# Patient Record
Sex: Male | Born: 1940 | Race: White | Hispanic: No | Marital: Married | Smoking: Current every day smoker
Health system: Southern US, Community
[De-identification: ages and names within clinical notes are randomized; demographics above are authoritative.]

## PROBLEM LIST (undated history)

## (undated) DIAGNOSIS — E119 Type 2 diabetes mellitus without complications: Secondary | ICD-10-CM

## (undated) DIAGNOSIS — M542 Cervicalgia: Secondary | ICD-10-CM

## (undated) DIAGNOSIS — I10 Essential (primary) hypertension: Secondary | ICD-10-CM

## (undated) DIAGNOSIS — E7800 Pure hypercholesterolemia, unspecified: Secondary | ICD-10-CM

## (undated) DIAGNOSIS — I4891 Unspecified atrial fibrillation: Secondary | ICD-10-CM

## (undated) DIAGNOSIS — I499 Cardiac arrhythmia, unspecified: Secondary | ICD-10-CM

## (undated) DIAGNOSIS — I639 Cerebral infarction, unspecified: Secondary | ICD-10-CM

## (undated) HISTORY — PX: ANTERIOR / POSTERIOR COMBINED FUSION CERVICAL SPINE: SUR627

## (undated) HISTORY — PX: NO PAST SURGERIES: SHX2092

## (undated) HISTORY — PX: VASECTOMY: SHX75

---

## 2008-01-05 HISTORY — PX: LAMINOTOMY / EXCISION DISK POSTERIOR CERVICAL SPINE: SUR749

## 2011-08-05 DIAGNOSIS — G4733 Obstructive sleep apnea (adult) (pediatric): Secondary | ICD-10-CM | POA: Insufficient documentation

## 2011-08-05 DIAGNOSIS — I10 Essential (primary) hypertension: Secondary | ICD-10-CM | POA: Insufficient documentation

## 2011-08-05 DIAGNOSIS — E669 Obesity, unspecified: Secondary | ICD-10-CM | POA: Insufficient documentation

## 2011-08-06 DIAGNOSIS — I452 Bifascicular block: Secondary | ICD-10-CM | POA: Insufficient documentation

## 2011-08-14 DIAGNOSIS — I4891 Unspecified atrial fibrillation: Secondary | ICD-10-CM | POA: Insufficient documentation

## 2011-12-23 DIAGNOSIS — K219 Gastro-esophageal reflux disease without esophagitis: Secondary | ICD-10-CM | POA: Insufficient documentation

## 2012-04-20 DIAGNOSIS — R109 Unspecified abdominal pain: Secondary | ICD-10-CM | POA: Insufficient documentation

## 2012-04-28 DIAGNOSIS — D3000 Benign neoplasm of unspecified kidney: Secondary | ICD-10-CM | POA: Insufficient documentation

## 2012-04-28 HISTORY — DX: Benign neoplasm of unspecified kidney: D30.00

## 2012-05-03 DIAGNOSIS — G894 Chronic pain syndrome: Secondary | ICD-10-CM | POA: Insufficient documentation

## 2012-07-27 DIAGNOSIS — E041 Nontoxic single thyroid nodule: Secondary | ICD-10-CM | POA: Insufficient documentation

## 2012-10-26 DIAGNOSIS — H269 Unspecified cataract: Secondary | ICD-10-CM | POA: Insufficient documentation

## 2013-05-08 DIAGNOSIS — G8220 Paraplegia, unspecified: Secondary | ICD-10-CM | POA: Insufficient documentation

## 2013-05-17 DIAGNOSIS — I679 Cerebrovascular disease, unspecified: Secondary | ICD-10-CM | POA: Insufficient documentation

## 2013-08-16 DIAGNOSIS — G629 Polyneuropathy, unspecified: Secondary | ICD-10-CM | POA: Insufficient documentation

## 2013-08-16 DIAGNOSIS — G8929 Other chronic pain: Secondary | ICD-10-CM | POA: Insufficient documentation

## 2013-08-16 DIAGNOSIS — M25559 Pain in unspecified hip: Secondary | ICD-10-CM | POA: Insufficient documentation

## 2014-03-06 DIAGNOSIS — D472 Monoclonal gammopathy: Secondary | ICD-10-CM | POA: Insufficient documentation

## 2014-06-06 DIAGNOSIS — R296 Repeated falls: Secondary | ICD-10-CM | POA: Insufficient documentation

## 2014-06-10 DIAGNOSIS — I480 Paroxysmal atrial fibrillation: Secondary | ICD-10-CM | POA: Insufficient documentation

## 2014-06-10 DIAGNOSIS — H3530 Unspecified macular degeneration: Secondary | ICD-10-CM | POA: Insufficient documentation

## 2014-11-15 DIAGNOSIS — R0600 Dyspnea, unspecified: Secondary | ICD-10-CM | POA: Insufficient documentation

## 2014-11-15 DIAGNOSIS — R0609 Other forms of dyspnea: Secondary | ICD-10-CM | POA: Insufficient documentation

## 2015-04-23 DIAGNOSIS — R079 Chest pain, unspecified: Secondary | ICD-10-CM | POA: Insufficient documentation

## 2015-04-23 DIAGNOSIS — Z9189 Other specified personal risk factors, not elsewhere classified: Secondary | ICD-10-CM | POA: Insufficient documentation

## 2015-12-18 DIAGNOSIS — Z9181 History of falling: Secondary | ICD-10-CM | POA: Insufficient documentation

## 2015-12-18 DIAGNOSIS — R270 Ataxia, unspecified: Secondary | ICD-10-CM | POA: Insufficient documentation

## 2015-12-18 DIAGNOSIS — I639 Cerebral infarction, unspecified: Secondary | ICD-10-CM | POA: Insufficient documentation

## 2015-12-19 DIAGNOSIS — R251 Tremor, unspecified: Secondary | ICD-10-CM | POA: Insufficient documentation

## 2016-02-06 DIAGNOSIS — M5136 Other intervertebral disc degeneration, lumbar region: Secondary | ICD-10-CM | POA: Insufficient documentation

## 2016-02-06 DIAGNOSIS — M5417 Radiculopathy, lumbosacral region: Secondary | ICD-10-CM | POA: Insufficient documentation

## 2016-02-06 DIAGNOSIS — M51369 Other intervertebral disc degeneration, lumbar region without mention of lumbar back pain or lower extremity pain: Secondary | ICD-10-CM | POA: Insufficient documentation

## 2016-07-23 DIAGNOSIS — M1990 Unspecified osteoarthritis, unspecified site: Secondary | ICD-10-CM | POA: Insufficient documentation

## 2016-07-26 DIAGNOSIS — Z7901 Long term (current) use of anticoagulants: Secondary | ICD-10-CM | POA: Insufficient documentation

## 2016-10-23 DIAGNOSIS — Z8679 Personal history of other diseases of the circulatory system: Secondary | ICD-10-CM | POA: Insufficient documentation

## 2016-10-23 DIAGNOSIS — Z9889 Other specified postprocedural states: Secondary | ICD-10-CM | POA: Insufficient documentation

## 2017-07-22 ENCOUNTER — Emergency Department: Payer: Medicare Other

## 2017-07-22 ENCOUNTER — Other Ambulatory Visit: Payer: Self-pay

## 2017-07-22 ENCOUNTER — Emergency Department
Admission: EM | Admit: 2017-07-22 | Discharge: 2017-07-22 | Disposition: A | Discharge status: Home / Self Care | Payer: Medicare Other | Attending: Emergency Medicine | Admitting: Emergency Medicine

## 2017-07-22 DIAGNOSIS — F17290 Nicotine dependence, other tobacco product, uncomplicated: Secondary | ICD-10-CM | POA: Diagnosis not present

## 2017-07-22 DIAGNOSIS — Y999 Unspecified external cause status: Secondary | ICD-10-CM | POA: Diagnosis not present

## 2017-07-22 DIAGNOSIS — S0101XA Laceration without foreign body of scalp, initial encounter: Secondary | ICD-10-CM | POA: Diagnosis not present

## 2017-07-22 DIAGNOSIS — Y9289 Other specified places as the place of occurrence of the external cause: Secondary | ICD-10-CM | POA: Diagnosis not present

## 2017-07-22 DIAGNOSIS — S46911A Strain of unspecified muscle, fascia and tendon at shoulder and upper arm level, right arm, initial encounter: Secondary | ICD-10-CM

## 2017-07-22 DIAGNOSIS — Y9389 Activity, other specified: Secondary | ICD-10-CM | POA: Diagnosis not present

## 2017-07-22 DIAGNOSIS — W19XXXA Unspecified fall, initial encounter: Secondary | ICD-10-CM

## 2017-07-22 DIAGNOSIS — S0990XA Unspecified injury of head, initial encounter: Secondary | ICD-10-CM | POA: Diagnosis present

## 2017-07-22 HISTORY — DX: Pure hypercholesterolemia, unspecified: E78.00

## 2017-07-22 HISTORY — DX: Cervicalgia: M54.2

## 2017-07-22 HISTORY — DX: Cerebral infarction, unspecified: I63.9

## 2017-07-22 HISTORY — DX: Unspecified atrial fibrillation: I48.91

## 2017-07-22 MED ORDER — LIDOCAINE-EPINEPHRINE (PF) 1 %-1:200000 IJ SOLN: 30.0000 mL | Freq: Once | INTRAMUSCULAR | Status: DC

## 2017-07-22 MED ORDER — OXYCODONE-ACETAMINOPHEN 5-325 MG PO TABS
2.0000 | ORAL_TABLET | Freq: Once | ORAL | Status: AC
  Administered 2017-07-22: 2 via ORAL
  Filled 2017-07-22: qty 2

## 2017-07-22 MED ORDER — OXYCODONE-ACETAMINOPHEN 7.5-325 MG PO TABS: 1.0000 | ORAL_TABLET | ORAL | 0 refills | Status: AC | PRN

## 2017-07-22 MED ORDER — MORPHINE SULFATE (PF) 4 MG/ML IV SOLN
4.0000 mg | Freq: Once | INTRAVENOUS | Status: AC
  Administered 2017-07-22: 4 mg via INTRAMUSCULAR
  Filled 2017-07-22: qty 1

## 2017-07-22 MED ORDER — LIDOCAINE-EPINEPHRINE 2 %-1:100000 IJ SOLN
INTRAMUSCULAR | Status: AC
  Administered 2017-07-22: 18:00:00
  Filled 2017-07-22: qty 1
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
## 2017-07-22 NOTE — ED Notes (Signed)
Laceration to back of head cleansed and dressed upon arrival.
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
## 2017-08-26 DIAGNOSIS — I729 Aneurysm of unspecified site: Secondary | ICD-10-CM | POA: Insufficient documentation

## 2018-08-02 DIAGNOSIS — I951 Orthostatic hypotension: Secondary | ICD-10-CM | POA: Insufficient documentation

## 2018-08-02 DIAGNOSIS — G909 Disorder of the autonomic nervous system, unspecified: Secondary | ICD-10-CM | POA: Insufficient documentation

## 2018-09-01 DIAGNOSIS — H5581 Saccadic eye movements: Secondary | ICD-10-CM | POA: Insufficient documentation

## 2018-09-01 DIAGNOSIS — F0781 Postconcussional syndrome: Secondary | ICD-10-CM | POA: Insufficient documentation

## 2018-09-01 DIAGNOSIS — H5111 Convergence insufficiency: Secondary | ICD-10-CM | POA: Insufficient documentation

## 2018-09-01 HISTORY — DX: Postconcussional syndrome: F07.81

## 2018-09-15 DIAGNOSIS — F331 Major depressive disorder, recurrent, moderate: Secondary | ICD-10-CM | POA: Insufficient documentation

## 2018-09-17 ENCOUNTER — Emergency Department: Payer: Medicare Other

## 2018-09-17 ENCOUNTER — Other Ambulatory Visit: Payer: Self-pay

## 2018-09-17 ENCOUNTER — Emergency Department
Admission: EM | Admit: 2018-09-17 | Discharge: 2018-09-17 | Disposition: A | Discharge status: Home / Self Care | Payer: Medicare Other | Attending: Emergency Medicine | Admitting: Emergency Medicine

## 2018-09-17 DIAGNOSIS — S80811A Abrasion, right lower leg, initial encounter: Secondary | ICD-10-CM | POA: Diagnosis not present

## 2018-09-17 DIAGNOSIS — W19XXXA Unspecified fall, initial encounter: Secondary | ICD-10-CM

## 2018-09-17 DIAGNOSIS — Y9301 Activity, walking, marching and hiking: Secondary | ICD-10-CM | POA: Insufficient documentation

## 2018-09-17 DIAGNOSIS — S80812A Abrasion, left lower leg, initial encounter: Secondary | ICD-10-CM | POA: Diagnosis not present

## 2018-09-17 DIAGNOSIS — S0993XA Unspecified injury of face, initial encounter: Secondary | ICD-10-CM | POA: Diagnosis present

## 2018-09-17 DIAGNOSIS — W1781XA Fall down embankment (hill), initial encounter: Secondary | ICD-10-CM | POA: Insufficient documentation

## 2018-09-17 DIAGNOSIS — S0081XA Abrasion of other part of head, initial encounter: Secondary | ICD-10-CM | POA: Diagnosis not present

## 2018-09-17 DIAGNOSIS — Y9289 Other specified places as the place of occurrence of the external cause: Secondary | ICD-10-CM | POA: Diagnosis not present

## 2018-09-17 DIAGNOSIS — M542 Cervicalgia: Secondary | ICD-10-CM | POA: Diagnosis not present

## 2018-09-17 DIAGNOSIS — F17290 Nicotine dependence, other tobacco product, uncomplicated: Secondary | ICD-10-CM | POA: Diagnosis not present

## 2018-09-17 DIAGNOSIS — Y999 Unspecified external cause status: Secondary | ICD-10-CM | POA: Diagnosis not present

## 2018-09-17 DIAGNOSIS — T07XXXA Unspecified multiple injuries, initial encounter: Secondary | ICD-10-CM

## 2018-09-17 DIAGNOSIS — S40811A Abrasion of right upper arm, initial encounter: Secondary | ICD-10-CM | POA: Diagnosis not present

## 2018-09-17 DIAGNOSIS — S40812A Abrasion of left upper arm, initial encounter: Secondary | ICD-10-CM | POA: Diagnosis not present

## 2018-09-17 MED ORDER — CYCLOBENZAPRINE HCL 10 MG PO TABS: 10.0000 mg | ORAL_TABLET | Freq: Three times a day (TID) | ORAL | 0 refills | Status: DC | PRN

## 2018-09-17 MED ORDER — HYDROCODONE-ACETAMINOPHEN 5-325 MG PO TABS
1.0000 | ORAL_TABLET | Freq: Once | ORAL | Status: AC
  Administered 2018-09-17: 1 via ORAL
  Filled 2018-09-17: qty 1

## 2018-09-17 MED ORDER — BACITRACIN ZINC 500 UNIT/GM EX OINT
TOPICAL_OINTMENT | Freq: Once | CUTANEOUS | Status: AC
  Administered 2018-09-17: 1 via TOPICAL
  Filled 2018-09-17: qty 4.5
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
## 2018-09-17 NOTE — Discharge Instructions (Addendum)
Your exam and CT scans were negative for any fracture or disruption to your hardware. Take your home meds and the muscle relaxant as needed. Follow-up with your provider as needed. You can expect to be sore for a few days. Keep the abrasions clean and apply petroleum jelly to promote healing.
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
## 2018-09-27 ENCOUNTER — Inpatient Hospital Stay
Admission: EM | Admit: 2018-09-27 | Discharge: 2018-09-29 | DRG: 872 | Disposition: A | Discharge status: Home / Self Care | Payer: Medicare Other | Attending: Internal Medicine | Admitting: Internal Medicine

## 2018-09-27 ENCOUNTER — Other Ambulatory Visit: Payer: Self-pay

## 2018-09-27 ENCOUNTER — Emergency Department: Payer: Medicare Other

## 2018-09-27 ENCOUNTER — Inpatient Hospital Stay: Payer: Medicare Other

## 2018-09-27 DIAGNOSIS — Z79899 Other long term (current) drug therapy: Secondary | ICD-10-CM

## 2018-09-27 DIAGNOSIS — G4733 Obstructive sleep apnea (adult) (pediatric): Secondary | ICD-10-CM | POA: Diagnosis present

## 2018-09-27 DIAGNOSIS — I480 Paroxysmal atrial fibrillation: Secondary | ICD-10-CM | POA: Diagnosis present

## 2018-09-27 DIAGNOSIS — E119 Type 2 diabetes mellitus without complications: Secondary | ICD-10-CM | POA: Diagnosis present

## 2018-09-27 DIAGNOSIS — A419 Sepsis, unspecified organism: Principal | ICD-10-CM | POA: Diagnosis present

## 2018-09-27 DIAGNOSIS — Z20828 Contact with and (suspected) exposure to other viral communicable diseases: Secondary | ICD-10-CM | POA: Diagnosis present

## 2018-09-27 DIAGNOSIS — Z8673 Personal history of transient ischemic attack (TIA), and cerebral infarction without residual deficits: Secondary | ICD-10-CM | POA: Diagnosis not present

## 2018-09-27 DIAGNOSIS — I10 Essential (primary) hypertension: Secondary | ICD-10-CM | POA: Diagnosis present

## 2018-09-27 DIAGNOSIS — Z7901 Long term (current) use of anticoagulants: Secondary | ICD-10-CM

## 2018-09-27 DIAGNOSIS — E785 Hyperlipidemia, unspecified: Secondary | ICD-10-CM | POA: Diagnosis present

## 2018-09-27 DIAGNOSIS — F17200 Nicotine dependence, unspecified, uncomplicated: Secondary | ICD-10-CM | POA: Diagnosis present

## 2018-09-27 DIAGNOSIS — K219 Gastro-esophageal reflux disease without esophagitis: Secondary | ICD-10-CM | POA: Diagnosis present

## 2018-09-27 DIAGNOSIS — L03114 Cellulitis of left upper limb: Secondary | ICD-10-CM | POA: Diagnosis present

## 2018-09-27 DIAGNOSIS — R140 Abdominal distension (gaseous): Secondary | ICD-10-CM | POA: Diagnosis present

## 2018-09-27 DIAGNOSIS — Z7982 Long term (current) use of aspirin: Secondary | ICD-10-CM

## 2018-09-27 LAB — LACTIC ACID, PLASMA
Lactic Acid, Venous: 1.6 mmol/L (ref 0.5–1.9)
Lactic Acid, Venous: 1 mmol/L (ref 0.5–1.9)

## 2018-09-27 LAB — CBC WITH DIFFERENTIAL/PLATELET
Abs Immature Granulocytes: 0.05 10*3/uL (ref 0.00–0.07)
Basophils Absolute: 0.1 10*3/uL (ref 0.0–0.1)
Basophils Relative: 0 %
Eosinophils Absolute: 0 10*3/uL (ref 0.0–0.5)
Eosinophils Relative: 0 %
HCT: 47.1 % (ref 39.0–52.0)
Hemoglobin: 15.3 g/dL (ref 13.0–17.0)
Immature Granulocytes: 0 %
Lymphocytes Relative: 6 %
Lymphs Abs: 0.7 10*3/uL (ref 0.7–4.0)
MCH: 29.2 pg (ref 26.0–34.0)
MCHC: 32.5 g/dL (ref 30.0–36.0)
MCV: 89.9 fL (ref 80.0–100.0)
Monocytes Absolute: 1.1 10*3/uL — ABNORMAL HIGH (ref 0.1–1.0)
Monocytes Relative: 10 %
Neutro Abs: 9.8 10*3/uL — ABNORMAL HIGH (ref 1.7–7.7)
Neutrophils Relative %: 84 %
Platelets: 214 10*3/uL (ref 150–400)
RBC: 5.24 MIL/uL (ref 4.22–5.81)
RDW: 14.6 % (ref 11.5–15.5)
WBC: 11.7 10*3/uL — ABNORMAL HIGH (ref 4.0–10.5)
nRBC: 0 % (ref 0.0–0.2)

## 2018-09-27 LAB — URINALYSIS, COMPLETE (UACMP) WITH MICROSCOPIC
Bacteria, UA: NONE SEEN
Bilirubin Urine: NEGATIVE
Glucose, UA: NEGATIVE mg/dL
Ketones, ur: NEGATIVE mg/dL
Leukocytes,Ua: NEGATIVE
Nitrite: NEGATIVE
Protein, ur: 30 mg/dL — AB
Specific Gravity, Urine: 1.021 (ref 1.005–1.030)
Squamous Epithelial / HPF: NONE SEEN (ref 0–5)
pH: 5 (ref 5.0–8.0)

## 2018-09-27 LAB — SARS CORONAVIRUS 2 BY RT PCR (HOSPITAL ORDER, PERFORMED IN ~~LOC~~ HOSPITAL LAB): SARS Coronavirus 2: NEGATIVE

## 2018-09-27 LAB — COMPREHENSIVE METABOLIC PANEL
ALT: 23 U/L (ref 0–44)
AST: 19 U/L (ref 15–41)
Albumin: 4 g/dL (ref 3.5–5.0)
Alkaline Phosphatase: 76 U/L (ref 38–126)
Anion gap: 10 (ref 5–15)
BUN: 15 mg/dL (ref 8–23)
CO2: 22 mmol/L (ref 22–32)
Calcium: 9.7 mg/dL (ref 8.9–10.3)
Chloride: 101 mmol/L (ref 98–111)
Creatinine, Ser: 0.99 mg/dL (ref 0.61–1.24)
GFR calc Af Amer: >60 mL/min (ref >60)
GFR calc non Af Amer: >60 mL/min (ref >60)
Glucose, Bld: 131 mg/dL — ABNORMAL HIGH (ref 70–99)
Potassium: 4.7 mmol/L (ref 3.5–5.1)
Sodium: 133 mmol/L — ABNORMAL LOW (ref 135–145)
Total Bilirubin: 1 mg/dL (ref 0.3–1.2)
Total Protein: 7.3 g/dL (ref 6.5–8.1)

## 2018-09-27 LAB — PROCALCITONIN: Procalcitonin: <0.1 ng/mL

## 2018-09-27 LAB — PROTIME-INR
INR: 1.2 (ref 0.8–1.2)
Prothrombin Time: 14.8 s (ref 11.4–15.2)

## 2018-09-27 MED ORDER — METRONIDAZOLE IN NACL 5-0.79 MG/ML-% IV SOLN
500.0000 mg | Freq: Three times a day (TID) | INTRAVENOUS | Status: DC
  Administered 2018-09-28: 500 mg via INTRAVENOUS
  Filled 2018-09-27 (×3): qty 100

## 2018-09-27 MED ORDER — VANCOMYCIN HCL 10 G IV SOLR
2500.0000 mg | Freq: Once | INTRAVENOUS | Status: AC
  Administered 2018-09-27: 2500 mg via INTRAVENOUS
  Filled 2018-09-27: qty 2500

## 2018-09-27 MED ORDER — SODIUM CHLORIDE 0.9 % IV BOLUS
1000.0000 mL | Freq: Once | INTRAVENOUS | Status: AC
  Administered 2018-09-27: 1000 mL via INTRAVENOUS

## 2018-09-27 MED ORDER — SODIUM CHLORIDE 0.9 % IV SOLN
1000.0000 mL | Freq: Once | INTRAVENOUS | Status: AC
  Administered 2018-09-27: 1000 mL via INTRAVENOUS

## 2018-09-27 MED ORDER — ACETAMINOPHEN 500 MG PO TABS
1000.0000 mg | ORAL_TABLET | Freq: Once | ORAL | Status: AC
  Administered 2018-09-27: 1000 mg via ORAL
  Filled 2018-09-27: qty 2

## 2018-09-27 MED ORDER — IBUPROFEN 600 MG PO TABS
600.0000 mg | ORAL_TABLET | Freq: Once | ORAL | Status: AC
  Administered 2018-09-27: 600 mg via ORAL
  Filled 2018-09-27: qty 1

## 2018-09-27 MED ORDER — SODIUM CHLORIDE 0.9 % IV SOLN: 2.0000 g | Freq: Once | INTRAVENOUS | Status: DC

## 2018-09-27 MED ORDER — METRONIDAZOLE IN NACL 5-0.79 MG/ML-% IV SOLN
500.0000 mg | Freq: Once | INTRAVENOUS | Status: AC
  Administered 2018-09-27: 500 mg via INTRAVENOUS
  Filled 2018-09-27: qty 100

## 2018-09-27 MED ORDER — VANCOMYCIN HCL IN DEXTROSE 1-5 GM/200ML-% IV SOLN: 1000.0000 mg | Freq: Once | INTRAVENOUS | Status: DC

## 2018-09-27 MED ORDER — ACETAMINOPHEN 325 MG PO TABS
650.0000 mg | ORAL_TABLET | Freq: Four times a day (QID) | ORAL | Status: DC | PRN
  Administered 2018-09-28 – 2018-09-29 (×2): 650 mg via ORAL
  Filled 2018-09-27 (×2): qty 2

## 2018-09-27 MED ORDER — SODIUM CHLORIDE 0.9 % IV SOLN
2.0000 g | Freq: Once | INTRAVENOUS | Status: AC
  Administered 2018-09-27: 2 g via INTRAVENOUS
  Filled 2018-09-27: qty 2

## 2018-09-27 MED ORDER — ACETAMINOPHEN 650 MG RE SUPP: 650.0000 mg | Freq: Four times a day (QID) | RECTAL | Status: DC | PRN

## 2018-09-27 NOTE — Progress Notes (Signed)
PHARMACY -  BRIEF ANTIBIOTIC NOTE   Pharmacy has received consult(s) for Vancomycin, Cefepime  from an ED provider.  The patient's profile has been reviewed for ht/wt/allergies/indication/available labs.    One time order(s) placed for vancomycin 2500 mg IV X 1 and cefepime 2 gm IV X 1.   Further antibiotics/pharmacy consults should be ordered by admitting physician if indicated.                       Thank you, Tarissa Kerin D 09/27/2018  9:08 PM

## 2018-09-27 NOTE — ED Triage Notes (Signed)
Pt referred to ER due to fever, elevated WBC, confusion and new onset of urinary incontinence today. Wife with patient states that pt felt fatigued yesterday but is significantly worse today. Alert and oriented to self, place situation and time. RR even and unlabored, c/o body aches and headache.

## 2018-09-27 NOTE — ED Notes (Signed)
Pt status discussed with Benjamine Mola, FNP, new orders received, will continue to monitor closely

## 2018-09-27 NOTE — ED Notes (Signed)
Pt status discussed with Dr. Joan Mayans, new orders received, will continue to monitor closely.

## 2018-09-27 NOTE — H&P (Addendum)
[place] [facility] [place] NAME: William Keith    MR#:  [id number]  DATE OF BIRTH:  18-Nov-1940  DATE OF ADMISSION:  09/27/2018  PRIMARY CARE PHYSICIAN: Rosalia Hammers, MD   REQUESTING/REFERRING PHYSICIAN: Herbie Baltimore, MD  CHIEF COMPLAINT:   Chief Complaint  Patient presents with  . Altered Mental Status  . Fever    HISTORY OF PRESENT ILLNESS:  78 y.o. male with pertinent past medical history of right MCA aneurysm, hypertension, OSA on CPAP, atrial fibrillation on anticoagulation, diabetes mellitus type 2, CVA, MGUS, GERD, hyperlipidemia, and RBBB presenting to the ED with fever and altered mental status.  Patient is altered therefore history mostly obtained from patient's chart.  Per ED report, patient was in the walk-in clinic today with complaints of fever, chills and generalized body aches x3 days.  He was noted to have elevated white count, confusion, urinary incontinence with possible abnormal x-ray finding therefore he was referred to the ED for further evaluation.  On arrival to the ED, he was febrile (Temp 103) with blood pressure 186/87 mm Hg, pulse rate 113 beats/min and respiration 36. There were no focal neurological deficits; he was alert and oriented x 1 but demonstrated memory deficits.  Labs revealed sodium 133, glucose 131, lactic acid 1.6, WBC 11.7, COVID-19 negative, UA negative for UTI.  Chest x-ray showed no active cardiopulmonary disease. Hospitalist asked to admit for further evaluation and management.  PAST MEDICAL HISTORY:   Past Medical History:  Diagnosis Date  . Atrial fibrillation ([place])   . Hypercholesteremia   . Neck pain   . Stroke [facility])     PAST SURGICAL HISTORY:  History reviewed. No pertinent surgical history.  SOCIAL HISTORY:   Social History   Tobacco Use  . Smoking status: Current Every Day Smoker    Types: Pipe  Substance Use Topics  . Alcohol use: Not Currently    FAMILY HISTORY:  No family  history on file.  DRUG ALLERGIES:   Allergies  Allergen Reactions  . Dilaudid [Hydromorphone Hcl] Other (See Comments)    hallucination    REVIEW OF SYSTEMS:   ROS Unable to assess due to current altered mental status.  MEDICATIONS AT HOME:   Prior to Admission medications   Medication Sig Start Date End Date Taking? Authorizing Provider  aspirin EC 81 MG tablet Take 81 mg by mouth daily.    [provider]  atenolol (TENORMIN) 25 MG tablet Take 75 mg by mouth daily.    [provider]  atorvastatin (LIPITOR) 80 MG tablet Take 80 mg by mouth daily. 08/01/18   [provider]  buPROPion (WELLBUTRIN SR) 150 MG 12 hr tablet Take 150 mg by mouth daily. 07/08/18   [provider]  cyclobenzaprine (FLEXERIL) 10 MG tablet Take 1 tablet (10 mg total) by mouth 3 (three) times daily as needed for up to 10 days. Patient taking differently: Take 10 mg by mouth 3 (three) times daily as needed for muscle spasms.  09/17/18 09/27/18  Menshew, Dannielle Karvonen, PA-C  DULoxetine (CYMBALTA) 60 MG capsule Take 60 mg by mouth daily. 08/02/18   [provider]  ELIQUIS 5 MG TABS tablet Take 5 mg by mouth 2 (two) times daily. 07/11/18   [provider]  fluticasone (FLONASE) 50 MCG/ACT nasal spray Place 2 sprays into the nose 2 (two) times daily as needed for rhinitis. 04/13/18 04/13/19  [provider]  gabapentin (NEURONTIN) 300 MG capsule Take 600  mg by mouth 3 (three) times daily. 09/09/18   [provider]  losartan (COZAAR) 50 MG tablet Take 25 mg by mouth daily. 08/13/18   [provider]  omeprazole (PRILOSEC) 40 MG capsule Take 40 mg by mouth daily. 09/06/18   [provider]  topiramate (TOPAMAX) 50 MG tablet Take 50 mg by mouth at bedtime. 06/18/18   [provider]      VITAL SIGNS:  Blood pressure (!) 157/80, pulse (!) 119, temperature (!) 101.3 F (38.5 C), resp. rate (!) 28, height 5\' 8"  (1.727 m), weight 104  kg, SpO2 98 %.  PHYSICAL EXAMINATION:   Physical Exam  GENERAL:  78 y.o.-year-old patient lying in the bed with no acute distress.  EYES: Pupils equal, round, reactive to light and accommodation. No scleral icterus. Extraocular muscles intact.  HEENT: Head atraumatic, normocephalic. Oropharynx and nasopharynx clear.  NECK:  Supple, no jugular venous distention. No thyroid enlargement, no tenderness.  LUNGS: Normal breath sounds bilaterally, no wheezing, rales,rhonchi or crepitation. No use of accessory muscles of respiration.  CARDIOVASCULAR: S1, S2 normal. No murmurs, rubs, or gallops.  ABDOMEN:  nontender, hard and distended. Bowel sounds hypoactive. No organomegaly or mass.  EXTREMITIES: No pedal edema, cyanosis, or clubbing.  NEUROLOGIC: Cranial nerves II through XII are intact. Muscle strength 5/5 in all extremities. Sensation intact. Gait not deferred due to safety.  PSYCHIATRIC: The patient is alert and oriented x 1.  SKIN: multiple bruising and possible cellulitis on the LUE?      DATA REVIEWED:  LABORATORY PANEL:   CBC Recent Labs  Lab 09/27/18 1834  WBC 11.7*  HGB 15.3  HCT 47.1  PLT 214   ------------------------------------------------------------------------------------------------------------------  Chemistries  Recent Labs  Lab 09/27/18 1834  NA 133*  K 4.7  CL 101  CO2 22  GLUCOSE 131*  BUN 15  CREATININE 0.99  CALCIUM 9.7  AST 19  ALT 23  ALKPHOS 76  BILITOT 1.0   ------------------------------------------------------------------------------------------------------------------  Cardiac Enzymes No results for input(s): TROPONINI in the last 168 hours. ------------------------------------------------------------------------------------------------------------------  RADIOLOGY:  Dg Chest 2 View  Result Date: 09/27/2018 CLINICAL DATA:  Fever, elevated white blood count, confusion and urinary incontinence. EXAM: CHEST - 2 VIEW COMPARISON:   None. FINDINGS: The heart size and mediastinal contours are within normal limits. Both lungs are clear. The visualized skeletal structures are unremarkable. IMPRESSION: No active cardiopulmonary disease. Electronically Signed   By: Lorriane Shire M.D.   On: 09/27/2018 19:19   Dg Abd 1 View  Result Date: 09/27/2018 CLINICAL DATA:  Abdominal distension. EXAM: ABDOMEN - 1 VIEW COMPARISON:  Included portion from chest radiograph earlier this day. FINDINGS: Tear abdomen not included in the field of view, particularly the upper abdomen is excluded. Large volume of stool in the ascending, transverse, and descending colon. Gaseous distension of sigmoid colon. No abnormal rectal distention. No significant small bowel dilatation. Limited assessment for free air. Four clustered rounded densities projecting over the right mid abdomen may be gallstones or external to the patient. IMPRESSION: 1. Large volume of colonic stool suggesting constipation. No evidence of small bowel obstruction. 2. Portions of the abdomen are excluded from the field of view. 3. Four clustered rounded densities projecting over the right mid abdomen may be gallstones or external to the patient. Electronically Signed   By: Keith Rake M.D.   On: 09/27/2018 23:00    EKG:  EKG: there are no previous tracings available.  IMPRESSION AND PLAN:   78 y.o. male  with pertinent past medical history of right MCA aneurysm, hypertension, OSA on CPAP, atrial fibrillation on anticoagulation, diabetes mellitus type 2, CVA, MGUS, GERD, hyperlipidemia, and RBBB presenting to the ED with fever and altered mental status.  1. Sepsis - Patient meets SIRS criteria,Heart Rate 113 beats/minute, Respiratory Rate 36 breaths/minute,Temperature 103. Possible cellulitis on the LUE as above? Slightly warm to touch - Admit to MedSurg unit - Chest x-ray shows no active cardiopulmonary process - Check procalcitonin - Monitor fever curve and trend WBC - UA shows no  evidence of infectious process - Urine cultures pending - Blood cultures pending - Start Empiric abx with vancomycin, cefepime and Flagyl  2. Altered mental status - unclear etiology - We will obtain CT head given history of A. fib - Also check KUB given distended abdomen  3. Paroxysmal atrial fibrillation -atenolol for rate control - Continue Eliquis  4. OSA on CPAP at home - Continue CPAP during this hospitalization  5. HTN  + Goal BP <130/80 - Continue losartan  6. HLD  + Goal LDL<100 - Atorvastatin 80mg  PO qhs  7. History of CVA - ASA 81mg  PO daily  8 DM: sugars have been well-controlled  - Last HgbA1c 09/2018 was 6.6 - Low intensity sliding scale insulin coverage  9. GERD - Protonix  10. DVT prophylaxis - Therapeutically anti-coagulated with Eliquis  All the records are reviewed and case discussed with ED provider. Management plans discussed with the patient, family and they are in agreement.  CODE STATUS: FULL  TOTAL TIME TAKING CARE OF THIS PATIENT: 50 minutes.    on 09/27/2018 at 11:16 PM   Rufina Falco, DNP,CCRN, FNP-BC Hospitalist Nurse Practitioner Between 7am to 6pm - Pager [phone]  After 6pm go to [email or web address] - password [email or web address] [place] [facility]  Office  [phone]  CC: Primary care physician; Rosalia Hammers, MD

## 2018-09-27 NOTE — ED Provider Notes (Addendum)
[facility] Emergency Department Provider Note   ____________________________________________    I have reviewed the triage vital signs and the nursing notes.   HISTORY  Chief Complaint Altered Mental Status and Fever     HPI William Keith is a 78 y.o. male with a history of A. fib, hypercholesterolemia who presents with fever, confusion.  Patient reports over the last 2 days he has had significant diffuse body aches, chills, fatigue and fever.  Reports a mild cough.  Is not take anything for this.  Went to walk-in clinic today referred to the emergency department.  No sick contacts reported.  No travel.  Did have a coronavirus test today but reports not clear when result will be ready  Past Medical History:  Diagnosis Date  . Atrial fibrillation ([place])   . Hypercholesteremia   . Neck pain   . Stroke [facility])     There are no active problems to display for this patient.   History reviewed. No pertinent surgical history.  Prior to Admission medications   Medication Sig Start Date End Date Taking? Authorizing Provider  cyclobenzaprine (FLEXERIL) 10 MG tablet Take 1 tablet (10 mg total) by mouth 3 (three) times daily as needed for up to 10 days. 09/17/18 09/27/18  Menshew, Dannielle Karvonen, PA-C     Allergies Dilaudid [hydromorphone hcl]  No family history on file.  Social History Social History   Tobacco Use  . Smoking status: Current Every Day Smoker    Types: Pipe  Substance Use Topics  . Alcohol use: Not Currently  . Drug use: Not on file    Review of Systems  Constitutional: Positive chills, positive fever Eyes: No visual changes.  ENT: No sore throat. Cardiovascular: Denies chest pain. Respiratory: Mild shortness of breath, mild cough Gastrointestinal: No abdominal pain. .   Genitourinary: Negative for dysuria. Musculoskeletal: Positive myalgias Skin: Mild rash to the left arm Neurological: Negative for headaches or weakness   ____________________________________________   PHYSICAL EXAM:  VITAL SIGNS: ED Triage Vitals [09/27/18 1832]  Enc Vitals Group     BP (!) 145/56     Pulse Rate (!) 105     Resp (!) 22     Temp (!) 101.4 F (38.6 C)     Temp Source Oral     SpO2 94 %     Weight 104 kg (229 lb 4.5 oz)     Height 1.727 m (5\' 8" )     Head Circumference      Peak Flow      Pain Score 4     Pain Loc      Pain Edu?      Excl. in [place]?     Constitutional: Alert and oriented.  Diaphoretic, ill-appearing Eyes: Conjunctivae are normal.   Nose: No congestion/rhinnorhea. Mouth/Throat: Mucous membranes are moist.    Cardiovascular: Tachycardia, regular rhythm. Grossly normal heart sounds.  Good peripheral circulation. Respiratory: Normal respiratory effort.  No retractions. Lungs CTAB. Gastrointestinal: Soft and nontender. No distention.  No CVA tenderness. Genitourinary: deferred Musculoskeletal: No lower extremity tenderness nor edema.  Warm and well perfused Neurologic:  Normal speech and language. No gross focal neurologic deficits are appreciated.  Skin:  Skin is warm, intact area of dull erythema to the left arm, no tenderness, healing infection per patient from fall into [facility]: Mood and affect are normal. Speech and behavior are normal.  ____________________________________________   LABS (all labs ordered are listed, but only abnormal results  are displayed)  Labs Reviewed  COMPREHENSIVE METABOLIC PANEL - Abnormal; Notable for the following components:      Result Value   Sodium 133 (*)    Glucose, Bld 131 (*)    All other components within normal limits  CBC WITH DIFFERENTIAL/PLATELET - Abnormal; Notable for the following components:   WBC 11.7 (*)    Neutro Abs 9.8 (*)    Monocytes Absolute 1.1 (*)    All other components within normal limits  CULTURE, BLOOD (ROUTINE X 2)  CULTURE, BLOOD (ROUTINE X 2)  SARS CORONAVIRUS 2 (HOSPITAL ORDER, [place] [facility])  LACTIC ACID, PLASMA  PROTIME-INR  URINALYSIS, COMPLETE (UACMP) WITH MICROSCOPIC   ____________________________________________  EKG  None ____________________________________________  RADIOLOGY  Chest x-ray unremarkable ____________________________________________   PROCEDURES  Procedure(s) performed: No  Procedures   Critical Care performed: CRITICAL CARE Performed by: Lavonia Drafts   Total critical care time:30  minutes  Critical care time was exclusive of separately billable procedures and treating other patients.  Critical care was necessary to treat or prevent imminent or life-threatening deterioration.  Critical care was time spent personally by me on the following activities: development of treatment plan with patient and/or surrogate as well as nursing, discussions with consultants, evaluation of patient's response to treatment, examination of patient, obtaining history from patient or surrogate, ordering and performing treatments and interventions, ordering and review of laboratory studies, ordering and review of radiographic studies, pulse oximetry and re-evaluation of patient's condition.  ____________________________________________   INITIAL IMPRESSION / ASSESSMENT AND PLAN / ED COURSE  Pertinent labs & imaging results that were available during my care of the patient were reviewed by me and considered in my medical decision making (see chart for details).  Patient presents with body aches, fever, chills, confusion, mild cough.  Symptoms are concerning for novel coronavirus versus sepsis.  Chest x-ray is reassuring, left arm infection appears well-healed, doubt that is the source.  Pending urinalysis, pending Covid swab.  Will cover with broad-spectrum antibiotics  ----------------------------------------- 8:54 PM on 09/27/2018 -----------------------------------------  Coronavirus negative, will admit to medicine service   William Keith was evaluated in Emergency Department on 09/27/2018 for the symptoms described in the history of present illness. He was evaluated in the context of the global COVID-19 pandemic, which necessitated consideration that the patient might be at risk for infection with the SARS-CoV-2 virus that causes COVID-19. Institutional protocols and algorithms that pertain to the evaluation of patients at risk for COVID-19 are in a state of rapid change based on information released by regulatory bodies including the CDC and federal and state organizations. These policies and algorithms were followed during the patient's care in the ED.     ____________________________________________   FINAL CLINICAL IMPRESSION(S) / ED DIAGNOSES  Final diagnoses:  Sepsis, due to unspecified organism, unspecified whether acute organ dysfunction present [facility])        Note:  This document was prepared using Dragon voice recognition software and may include unintentional dictation errors.   Lavonia Drafts, MD 09/27/18 2055    Lavonia Drafts, MD 09/27/18 2103

## 2018-09-27 NOTE — Progress Notes (Signed)
CODE SEPSIS - PHARMACY COMMUNICATION  **Broad Spectrum Antibiotics should be administered within 1 hour of Sepsis diagnosis**  Time Code Sepsis Called/Page Received: 2239  Antibiotics Ordered: vanc/cefepime/flagyl  Time of 1st antibiotic administration: 2119  Additional action taken by pharmacy:   If necessary, Name of Provider/Nurse Contacted:     Tobie Lords ,PharmD Clinical Pharmacist  09/27/2018  10:45 PM

## 2018-09-27 NOTE — ED Notes (Signed)
ED TO INPATIENT HANDOFF REPORT  ED Nurse Name and Phone #: Calli Bashor [phone]  S Name/Age/Gender William Keith 78 y.o. male Room/Bed: ED18A/ED18A  Code Status   Code Status: Full Code  Home/SNF/Other Home Patient oriented to: self and place Is this baseline? No   Triage Complete: Triage complete  Chief Complaint Fever/chills  Triage Note Pt referred to ER due to fever, elevated WBC, confusion and new onset of urinary incontinence today. Wife with patient states that pt felt fatigued yesterday but is significantly worse today. Alert and oriented to self, place situation and time. RR even and unlabored, c/o body aches and headache.   Allergies Allergies  Allergen Reactions  . Dilaudid [Hydromorphone Hcl] Other (See Comments)    hallucination    Level of Care/Admitting Diagnosis ED Disposition    ED Disposition Condition [facility] Area: [place] [[id number]]  Level of Care: Med-Surg [16]  Covid Evaluation: Confirmed COVID Negative  [facility]: [id number]  Admitting Physician: Eula Flax  Attending Physician: Rufina Falco ACHIENG [id number]  Estimated length of stay: past midnight tomorrow  Certification:: I certify this patient will need inpatient services for at least 2 midnights  PT Class (Do Not Modify): Inpatient [101]  PT Acc Code (Do Not Modify): Private [1]       B Medical/Surgery History Past Medical History:  Diagnosis Date  . Atrial fibrillation ([place])   . Hypercholesteremia   . Neck pain   . Stroke [facility])    History reviewed. No pertinent surgical history.   A IV Location/Drains/Wounds Patient Lines/Drains/Airways Status   Active Line/Drains/Airways    Name:   Placement date:   Placement time:   Site:   Days:   Peripheral IV 09/27/18 Left Antecubital   09/27/18    1843    Antecubital   less than 1   Peripheral IV 09/27/18 Left Hand   09/27/18    2013    Hand   less than 1           Intake/Output Last 24 hours  Intake/Output Summary (Last 24 hours) at 09/27/2018 2313 Last data filed at 09/27/2018 2246 Gross per 24 hour  Intake 2000 ml  Output -  Net 2000 ml    Labs/Imaging Results for orders placed or performed during the hospital encounter of 09/27/18 (from the past 48 hour(s))  Comprehensive metabolic panel     Status: Abnormal   Collection Time: 09/27/18  6:34 PM  Result Value Ref Range   Sodium 133 (L) 135 - 145 mmol/L   Potassium 4.7 3.5 - 5.1 mmol/L   Chloride 101 98 - 111 mmol/L   CO2 22 22 - 32 mmol/L   Glucose, Bld 131 (H) 70 - 99 mg/dL   BUN 15 8 - 23 mg/dL   Creatinine, Ser 0.99 0.61 - 1.24 mg/dL   Calcium 9.7 8.9 - 10.3 mg/dL   Total Protein 7.3 6.5 - 8.1 g/dL   Albumin 4.0 3.5 - 5.0 g/dL   AST 19 15 - 41 U/L   ALT 23 0 - 44 U/L   Alkaline Phosphatase 76 38 - 126 U/L   Total Bilirubin 1.0 0.3 - 1.2 mg/dL   GFR calc non Af Amer >60 >60 mL/min   GFR calc Af Amer >60 >60 mL/min   Anion gap 10 5 - 15    Comment: Performed at [facility], [address] [place], [place] [address]  Lactic acid, plasma  Status: None   Collection Time: 09/27/18  6:34 PM  Result Value Ref Range   Lactic Acid, Venous 1.6 0.5 - 1.9 mmol/L    Comment: Performed at [facility], [address] [place], [place] [address]  CBC with Differential     Status: Abnormal   Collection Time: 09/27/18  6:34 PM  Result Value Ref Range   WBC 11.7 (H) 4.0 - 10.5 K/uL   RBC 5.24 4.22 - 5.81 MIL/uL   Hemoglobin 15.3 13.0 - 17.0 g/dL   HCT 47.1 39.0 - 52.0 %   MCV 89.9 80.0 - 100.0 fL   MCH 29.2 26.0 - 34.0 pg   MCHC 32.5 30.0 - 36.0 g/dL   RDW 14.6 11.5 - 15.5 %   Platelets 214 150 - 400 K/uL   nRBC 0.0 0.0 - 0.2 %   Neutrophils Relative % 84 %   Neutro Abs 9.8 (H) 1.7 - 7.7 K/uL   Lymphocytes Relative 6 %   Lymphs Abs 0.7 0.7 - 4.0 K/uL   Monocytes Relative 10 %   Monocytes Absolute 1.1 (H) 0.1 - 1.0 K/uL   Eosinophils Relative 0 %    Eosinophils Absolute 0.0 0.0 - 0.5 K/uL   Basophils Relative 0 %   Basophils Absolute 0.1 0.0 - 0.1 K/uL   Immature Granulocytes 0 %   Abs Immature Granulocytes 0.05 0.00 - 0.07 K/uL    Comment: Performed at [facility], [address], [place], [place] [address]  SARS Coronavirus 2 [facility] order, Performed in [facility] hospital lab) Nasopharyngeal Nasopharyngeal Swab     Status: None   Collection Time: 09/27/18  7:53 PM   Specimen: Nasopharyngeal Swab  Result Value Ref Range   SARS Coronavirus 2 NEGATIVE NEGATIVE    Comment: (NOTE) If result is NEGATIVE SARS-CoV-2 target nucleic acids are NOT DETECTED. The SARS-CoV-2 RNA is generally detectable in upper and lower  respiratory specimens during the acute phase of infection. The lowest  concentration of SARS-CoV-2 viral copies this assay can detect is 250  copies / mL. A negative result does not preclude SARS-CoV-2 infection  and should not be used as the sole basis for treatment or other  patient management decisions.  A negative result may occur with  improper specimen collection / handling, submission of specimen other  than nasopharyngeal swab, presence of viral mutation(s) within the  areas targeted by this assay, and inadequate number of viral copies  (<250 copies / mL). A negative result must be combined with clinical  observations, patient history, and epidemiological information. If result is POSITIVE SARS-CoV-2 target nucleic acids are DETECTED. The SARS-CoV-2 RNA is generally detectable in upper and lower  respiratory specimens dur ing the acute phase of infection.  Positive  results are indicative of active infection with SARS-CoV-2.  Clinical  correlation with patient history and other diagnostic information is  necessary to determine patient infection status.  Positive results do  not rule out bacterial infection or co-infection with other viruses. If result is PRESUMPTIVE POSTIVE SARS-CoV-2 nucleic acids  MAY BE PRESENT.   A presumptive positive result was obtained on the submitted specimen  and confirmed on repeat testing.  While 2019 novel coronavirus  (SARS-CoV-2) nucleic acids may be present in the submitted sample  additional confirmatory testing may be necessary for epidemiological  and / or clinical management purposes  to differentiate between  SARS-CoV-2 and other Sarbecovirus currently known to infect humans.  If clinically indicated additional testing with an alternate  test  methodology [phone]) is advised. The SARS-CoV-2 RNA is generally  detectable in upper and lower respiratory sp ecimens during the acute  phase of infection. The expected result is Negative. Fact Sheet for Patients:  StrictlyIdeas.no Fact Sheet for Healthcare Providers: BankingDealers.co.za This test is not yet approved or cleared by the [place] FDA and has been authorized for detection and/or diagnosis of SARS-CoV-2 by FDA under an Emergency Use Authorization (EUA).  This EUA will remain in effect (meaning this test can be used) for the duration of the COVID-19 declaration under Section 564(b)(1) of the Act, 21 U.S.C. section 360bbb-3(b)(1), unless the authorization is terminated or revoked sooner. Performed at [facility], [address] [place], [place] [address]   Protime-INR     Status: None   Collection Time: 09/27/18  8:04 PM  Result Value Ref Range   Prothrombin Time 14.8 11.4 - 15.2 seconds   INR 1.2 0.8 - 1.2    Comment: (NOTE) INR goal varies based on device and disease states. Performed at [facility], [address] [place], [place] [address]   Lactic acid, plasma     Status: None   Collection Time: 09/27/18 10:20 PM  Result Value Ref Range   Lactic Acid, Venous 1.0 0.5 - 1.9 mmol/L    Comment: Performed at [facility], [address] [place], [place] [address]  Urinalysis, Complete w Microscopic      Status: Abnormal   Collection Time: 09/27/18 10:47 PM  Result Value Ref Range   Color, Urine YELLOW (A) YELLOW   APPearance CLEAR (A) CLEAR   Specific Gravity, Urine 1.021 1.005 - 1.030   pH 5.0 5.0 - 8.0   Glucose, UA NEGATIVE NEGATIVE mg/dL   Hgb urine dipstick SMALL (A) NEGATIVE   Bilirubin Urine NEGATIVE NEGATIVE   Ketones, ur NEGATIVE NEGATIVE mg/dL   Protein, ur 30 (A) NEGATIVE mg/dL   Nitrite NEGATIVE NEGATIVE   Leukocytes,Ua NEGATIVE NEGATIVE   RBC / HPF 21-50 0 - 5 RBC/hpf   WBC, UA 0-5 0 - 5 WBC/hpf   Bacteria, UA NONE SEEN NONE SEEN   Squamous Epithelial / LPF NONE SEEN 0 - 5   Mucus PRESENT     Comment: Performed at [facility], [address], [place], [place] [address]   Dg Chest 2 View  Result Date: 09/27/2018 CLINICAL DATA:  Fever, elevated white blood count, confusion and urinary incontinence. EXAM: CHEST - 2 VIEW COMPARISON:  None. FINDINGS: The heart size and mediastinal contours are within normal limits. Both lungs are clear. The visualized skeletal structures are unremarkable. IMPRESSION: No active cardiopulmonary disease. Electronically Signed   By: Lorriane Shire M.D.   On: 09/27/2018 19:19   Dg Abd 1 View  Result Date: 09/27/2018 CLINICAL DATA:  Abdominal distension. EXAM: ABDOMEN - 1 VIEW COMPARISON:  Included portion from chest radiograph earlier this day. FINDINGS: Tear abdomen not included in the field of view, particularly the upper abdomen is excluded. Large volume of stool in the ascending, transverse, and descending colon. Gaseous distension of sigmoid colon. No abnormal rectal distention. No significant small bowel dilatation. Limited assessment for free air. Four clustered rounded densities projecting over the right mid abdomen may be gallstones or external to the patient. IMPRESSION: 1. Large volume of colonic stool suggesting constipation. No evidence of small bowel obstruction. 2. Portions of the abdomen are excluded from the field of  view. 3. Four clustered rounded densities projecting over the right mid abdomen may  be gallstones or external to the patient. Electronically Signed   By: Keith Rake M.D.   On: 09/27/2018 23:00    Pending Labs Unresulted Labs (From admission, onward)    Start     Ordered   09/28/18 [id number]  Basic metabolic panel  Tomorrow morning,   STAT     09/27/18 2241   09/28/18 0500  CBC  Tomorrow morning,   STAT     09/27/18 2241   09/27/18 2251  Procalcitonin  Add-on,   AD     09/27/18 2250   09/27/18 2217  Lactic acid, plasma  Now then every 2 hours,   STAT     09/27/18 2216   09/27/18 1834  Culture, blood (Routine x 2)  BLOOD CULTURE X 2,   STAT     09/27/18 1833          Vitals/Pain Today's Vitals   09/27/18 2124 09/27/18 2130 09/27/18 2200 09/27/18 2230  BP:  (!) 190/88 (!) 166/86 (!) 157/80  Pulse:  (!) 122 (!) 120 (!) 119  Resp:  (!) 28 (!) 37 (!) 28  Temp:  (!) 103 F (39.4 C)  (!) 101.3 F (38.5 C)  TempSrc:      SpO2:  98% 100% 98%  Weight:      Height:      PainSc: 0-No pain       Isolation Precautions No active isolations  Medications Medications  vancomycin (VANCOCIN) 2,500 mg in sodium chloride 0.9 % 500 mL IVPB (2,500 mg Intravenous New Bag/Given 09/27/18 2153)  metroNIDAZOLE (FLAGYL) IVPB 500 mg (has no administration in time range)  acetaminophen (TYLENOL) tablet 650 mg (has no administration in time range)    Or  acetaminophen (TYLENOL) suppository 650 mg (has no administration in time range)  sodium chloride 0.9 % bolus 1,000 mL (0 mLs Intravenous Stopped 09/27/18 2246)  acetaminophen (TYLENOL) tablet 1,000 mg (1,000 mg Oral Given 09/27/18 2015)  0.9 %  sodium chloride infusion (0 mLs Intravenous Stopped 09/27/18 2246)  metroNIDAZOLE (FLAGYL) IVPB 500 mg (0 mg Intravenous Stopped 09/27/18 2218)  ceFEPIme (MAXIPIME) 2 g in sodium chloride 0.9 % 100 mL IVPB (0 g Intravenous Stopped 09/27/18 2153)  ibuprofen (ADVIL) tablet 600 mg (600 mg Oral Given 09/27/18 2158)     Mobility uta     Focused Assessments pt has lots of angry red scratches to arms    R Recommendations: See Admitting Provider Note  Report given to:   Additional Notes:

## 2018-09-28 LAB — CBC
HCT: 43.2 % (ref 39.0–52.0)
Hemoglobin: 13.2 g/dL (ref 13.0–17.0)
MCH: 28.1 pg (ref 26.0–34.0)
MCHC: 30.6 g/dL (ref 30.0–36.0)
MCV: 92.1 fL (ref 80.0–100.0)
Platelets: 173 10*3/uL (ref 150–400)
RBC: 4.69 MIL/uL (ref 4.22–5.81)
RDW: 14.9 % (ref 11.5–15.5)
WBC: 16.6 10*3/uL — ABNORMAL HIGH (ref 4.0–10.5)
nRBC: 0 % (ref 0.0–0.2)

## 2018-09-28 LAB — GLUCOSE, CAPILLARY
Glucose-Capillary: 119 mg/dL — ABNORMAL HIGH (ref 70–99)
Glucose-Capillary: 130 mg/dL — ABNORMAL HIGH (ref 70–99)
Glucose-Capillary: 139 mg/dL — ABNORMAL HIGH (ref 70–99)
Glucose-Capillary: 99 mg/dL (ref 70–99)

## 2018-09-28 LAB — BASIC METABOLIC PANEL
Anion gap: 7 (ref 5–15)
BUN: 21 mg/dL (ref 8–23)
CO2: 20 mmol/L — ABNORMAL LOW (ref 22–32)
Calcium: 8.3 mg/dL — ABNORMAL LOW (ref 8.9–10.3)
Chloride: 108 mmol/L (ref 98–111)
Creatinine, Ser: 1.04 mg/dL (ref 0.61–1.24)
GFR calc Af Amer: >60 mL/min (ref >60)
GFR calc non Af Amer: >60 mL/min (ref >60)
Glucose, Bld: 148 mg/dL — ABNORMAL HIGH (ref 70–99)
Potassium: 4 mmol/L (ref 3.5–5.1)
Sodium: 135 mmol/L (ref 135–145)

## 2018-09-28 LAB — INFLUENZA PANEL BY PCR (TYPE A & B)
Influenza A By PCR: NEGATIVE
Influenza B By PCR: NEGATIVE

## 2018-09-28 LAB — MRSA PCR SCREENING: MRSA by PCR: NEGATIVE

## 2018-09-28 LAB — LACTIC ACID, PLASMA: Lactic Acid, Venous: 1.4 mmol/L (ref 0.5–1.9)

## 2018-09-28 MED ORDER — POLYETHYLENE GLYCOL 3350 17 G PO PACK
17.0000 g | PACK | Freq: Every day | ORAL | Status: DC
  Filled 2018-09-28 (×2): qty 1

## 2018-09-28 MED ORDER — ATENOLOL 50 MG PO TABS
75.0000 mg | ORAL_TABLET | Freq: Every day | ORAL | Status: DC
  Administered 2018-09-28 – 2018-09-29 (×2): 75 mg via ORAL
  Filled 2018-09-28 (×2): qty 1

## 2018-09-28 MED ORDER — APIXABAN 5 MG PO TABS
5.0000 mg | ORAL_TABLET | Freq: Two times a day (BID) | ORAL | Status: DC
  Administered 2018-09-28 – 2018-09-29 (×4): 5 mg via ORAL
  Filled 2018-09-28 (×4): qty 1

## 2018-09-28 MED ORDER — IPRATROPIUM-ALBUTEROL 0.5-2.5 (3) MG/3ML IN SOLN
3.0000 mL | Freq: Four times a day (QID) | RESPIRATORY_TRACT | Status: DC | PRN
  Filled 2018-09-28: qty 3

## 2018-09-28 MED ORDER — PANTOPRAZOLE SODIUM 40 MG PO TBEC
40.0000 mg | DELAYED_RELEASE_TABLET | Freq: Every day | ORAL | Status: DC
  Administered 2018-09-28 – 2018-09-29 (×2): 40 mg via ORAL
  Filled 2018-09-28 (×2): qty 1

## 2018-09-28 MED ORDER — SODIUM CHLORIDE 0.9% FLUSH
10.0000 mL | Freq: Two times a day (BID) | INTRAVENOUS | Status: DC
  Administered 2018-09-28: 10 mL via INTRAVENOUS

## 2018-09-28 MED ORDER — SORBITOL 70 % SOLN
960.0000 mL | TOPICAL_OIL | Freq: Once | ORAL | Status: DC
  Filled 2018-09-28: qty 473

## 2018-09-28 MED ORDER — BUPROPION HCL ER (SR) 150 MG PO TB12
150.0000 mg | ORAL_TABLET | Freq: Every day | ORAL | Status: DC
  Administered 2018-09-28 – 2018-09-29 (×2): 150 mg via ORAL
  Filled 2018-09-28 (×2): qty 1

## 2018-09-28 MED ORDER — BISACODYL 5 MG PO TBEC
10.0000 mg | DELAYED_RELEASE_TABLET | Freq: Once | ORAL | Status: AC
  Administered 2018-09-28: 10 mg via ORAL
  Filled 2018-09-28: qty 2

## 2018-09-28 MED ORDER — SODIUM CHLORIDE 0.9 % IV SOLN
INTRAVENOUS | Status: DC | PRN
  Administered 2018-09-28 (×2): 250 mL via INTRAVENOUS

## 2018-09-28 MED ORDER — ATORVASTATIN CALCIUM 80 MG PO TABS
80.0000 mg | ORAL_TABLET | Freq: Every day | ORAL | Status: DC
  Administered 2018-09-28: 80 mg via ORAL
  Filled 2018-09-28: qty 1

## 2018-09-28 MED ORDER — TOPIRAMATE 25 MG PO TABS
50.0000 mg | ORAL_TABLET | Freq: Every day | ORAL | Status: DC
  Administered 2018-09-28 (×2): 50 mg via ORAL
  Filled 2018-09-28 (×3): qty 2

## 2018-09-28 MED ORDER — CEFAZOLIN SODIUM-DEXTROSE 1-4 GM/50ML-% IV SOLN
1.0000 g | Freq: Three times a day (TID) | INTRAVENOUS | Status: DC
  Administered 2018-09-28 – 2018-09-29 (×3): 1 g via INTRAVENOUS
  Filled 2018-09-28 (×7): qty 50

## 2018-09-28 MED ORDER — SODIUM CHLORIDE 0.9 % IV SOLN
2.0000 g | Freq: Three times a day (TID) | INTRAVENOUS | Status: DC
  Administered 2018-09-28: 2 g via INTRAVENOUS
  Filled 2018-09-28 (×3): qty 2

## 2018-09-28 MED ORDER — FLUTICASONE PROPIONATE 50 MCG/ACT NA SUSP
2.0000 | Freq: Two times a day (BID) | NASAL | Status: DC | PRN
  Filled 2018-09-28: qty 16

## 2018-09-28 MED ORDER — ASPIRIN EC 81 MG PO TBEC
81.0000 mg | DELAYED_RELEASE_TABLET | Freq: Every day | ORAL | Status: DC
  Administered 2018-09-28 – 2018-09-29 (×2): 81 mg via ORAL
  Filled 2018-09-28 (×2): qty 1

## 2018-09-28 MED ORDER — SENNOSIDES-DOCUSATE SODIUM 8.6-50 MG PO TABS
1.0000 | ORAL_TABLET | Freq: Two times a day (BID) | ORAL | Status: DC
  Administered 2018-09-28 – 2018-09-29 (×3): 1 via ORAL
  Filled 2018-09-28 (×3): qty 1

## 2018-09-28 MED ORDER — CICLOPIROX 1 % EX SHAM: 1.0000 "application " | MEDICATED_SHAMPOO | Freq: Every day | CUTANEOUS | Status: DC | PRN

## 2018-09-28 MED ORDER — DULOXETINE HCL 30 MG PO CPEP
60.0000 mg | ORAL_CAPSULE | Freq: Every day | ORAL | Status: DC
  Administered 2018-09-28 – 2018-09-29 (×2): 60 mg via ORAL
  Filled 2018-09-28 (×2): qty 2

## 2018-09-28 MED ORDER — VANCOMYCIN HCL 10 G IV SOLR: 1500.0000 mg | INTRAVENOUS | Status: DC

## 2018-09-28 MED ORDER — NITROGLYCERIN 0.4 MG SL SUBL: 0.4000 mg | SUBLINGUAL_TABLET | SUBLINGUAL | Status: DC | PRN

## 2018-09-28 MED ORDER — ADULT MULTIVITAMIN W/MINERALS CH
1.0000 | ORAL_TABLET | Freq: Every day | ORAL | Status: DC
  Administered 2018-09-28 – 2018-09-29 (×2): 1 via ORAL
  Filled 2018-09-28 (×2): qty 1

## 2018-09-28 MED ORDER — GABAPENTIN 300 MG PO CAPS
600.0000 mg | ORAL_CAPSULE | Freq: Three times a day (TID) | ORAL | Status: DC
  Administered 2018-09-28 – 2018-09-29 (×4): 600 mg via ORAL
  Filled 2018-09-28 (×4): qty 2

## 2018-09-28 MED ORDER — OXYCODONE HCL 5 MG PO TABS: 5.0000 mg | ORAL_TABLET | Freq: Three times a day (TID) | ORAL | Status: DC | PRN

## 2018-09-28 MED ORDER — VITAMIN D 25 MCG (1000 UNIT) PO TABS
2000.0000 [IU] | ORAL_TABLET | Freq: Every day | ORAL | Status: DC
  Administered 2018-09-28 – 2018-09-29 (×2): 2000 [IU] via ORAL
  Filled 2018-09-28 (×2): qty 2

## 2018-09-28 MED ORDER — LOSARTAN POTASSIUM 25 MG PO TABS
25.0000 mg | ORAL_TABLET | Freq: Every day | ORAL | Status: DC
  Administered 2018-09-28 – 2018-09-29 (×2): 25 mg via ORAL
  Filled 2018-09-28 (×2): qty 1

## 2018-09-28 MED ORDER — OXYCODONE HCL 5 MG PO TABS
5.0000 mg | ORAL_TABLET | Freq: Three times a day (TID) | ORAL | Status: DC | PRN
  Administered 2018-09-29: 5 mg via ORAL
  Filled 2018-09-28: qty 1

## 2018-09-28 MED ORDER — HYDROCORTISONE 1 % EX CREA
TOPICAL_CREAM | Freq: Two times a day (BID) | CUTANEOUS | Status: DC | PRN
  Filled 2018-09-28: qty 28

## 2018-09-28 MED ORDER — VANCOMYCIN HCL 1.5 G IV SOLR
1500.0000 mg | INTRAVENOUS | Status: DC
  Filled 2018-09-28: qty 1500

## 2018-09-28 NOTE — Progress Notes (Signed)
Pharmacy Antibiotic Note  William Keith is a 78 y.o. male admitted on 09/27/2018 with sepsis.  Pharmacy has been consulted for vanc/cefepime dosing.  Sepsis source is currently unknown, but patient has possible cellulitis on LUE as well.  Upon admission, patient was febrile and tachycardic, as well as exhibiting increasing WBC.  He is currently on day 2 of antibiotics,  and is currently on vancomycin and cefepime.  He received vancomycin 2.5 g IV load in ED 9/23.  Plan:  Will Continue vancomycin 1500 mg IV Q 24 hrs. Goal AUC 400-550. Expected AUC: 551.1 SCr used: 1.04 Cssmin: 12.6  Will continue cefepime 2g IV q8h per CrCl > 60 ml/min.   Will continue to monitor for s/sx of infx as well as renal function with AM labs, and adjust as needed.  Height: 5\' 8"  (172.7 cm) Weight: 233 lb 6.4 oz (105.9 kg) IBW/kg (Calculated) : 68.4  Temp (24hrs), Avg:100.5 F (38.1 C), Min:97.4 F (36.3 C), Max:103 F (39.4 C)  Recent Labs  Lab 09/27/18 1834 09/27/18 2220 09/28/18 0022 09/28/18 0359  WBC 11.7*  --   --  16.6*  CREATININE 0.99  --   --  1.04  LATICACIDVEN 1.6 1.0 1.4  --     Estimated Creatinine Clearance: 69.1 mL/min (by C-G formula based on SCr of 1.04 mg/dL).    Allergies  Allergen Reactions  . Dilaudid [Hydromorphone Hcl] Other (See Comments)    hallucination   Microbiology 9/24 MRSA PCR negative 9/23 Bcx NG 9/23 SARS Coronavirus 2 negative  Antimicrobials this Admission Cefepime 9/23 >> Vancomycin 9/23 >> Metronidazole 9/23 >> 9/24  Thank you for allowing pharmacy to be a part of this patient's care.  Gerald [name], PharmD Pharmacy Resident  09/28/2018 12:32 PM

## 2018-09-28 NOTE — Progress Notes (Signed)
[facility] NAME: William Keith    MR#:  [id number]  DATE OF BIRTH:  [birth year]  SUBJECTIVE:  CHIEF COMPLAINT:   Chief Complaint  Patient presents with   Altered Mental Status   Fever   - Left arm swelling, multiple injuries superficial from falling in the bush on both arms.  But much erythema and swelling of the left arm -Still has chills this morning and significant fatigue  REVIEW OF SYSTEMS:  Review of Systems  Constitutional: Positive for chills and malaise/fatigue. Negative for fever.  HENT: Negative for congestion, ear discharge, hearing loss and nosebleeds.   Eyes: Negative for blurred vision and double vision.  Respiratory: Negative for cough, shortness of breath and wheezing.   Cardiovascular: Negative for chest pain, palpitations and leg swelling.  Gastrointestinal: Negative for abdominal pain, constipation, diarrhea, nausea and vomiting.  Genitourinary: Negative for dysuria.  Musculoskeletal: Positive for myalgias.       Swelling of the left arm  Neurological: Negative for dizziness, focal weakness, seizures, weakness and headaches.  Psychiatric/Behavioral: Negative for depression.    DRUG ALLERGIES:   Allergies  Allergen Reactions   Dilaudid [Hydromorphone Hcl] Other (See Comments)    hallucination    VITALS:  Blood pressure 128/64, pulse 91, temperature (!) 97.4 F (36.3 C), temperature source Oral, resp. rate 19, height 5' 8"  (1.727 m), weight 105.9 kg, SpO2 100 %.  PHYSICAL EXAMINATION:  Physical Exam  GENERAL:  78 y.o.-year-old patient lying in the bed with no acute distress.  EYES: Pupils equal, round, reactive to light and accommodation. No scleral icterus. Extraocular muscles intact.  HEENT: Head atraumatic, normocephalic. Oropharynx and nasopharynx clear.  NECK:  Supple, no jugular venous distention. No thyroid enlargement, no tenderness.  LUNGS: Normal breath sounds bilaterally, no  wheezing, rales,rhonchi or crepitation. No use of accessory muscles of respiration.  Decreased bibasilar breath sounds CARDIOVASCULAR: S1, S2 normal. No murmurs, rubs, or gallops.  ABDOMEN: Soft, nontender, nondistended. Bowel sounds present. No organomegaly or mass.  EXTREMITIES: No pedal edema, cyanosis, or clubbing.  Significant swelling and erythema of the left forearm. NEUROLOGIC: Cranial nerves II through XII are intact. Muscle strength 5/5 in all extremities. Sensation intact. Gait not checked.  PSYCHIATRIC: The patient is alert and oriented x 3.  SKIN: No obvious rash, lesion, or ulcer.  Superficial linear lacerations noted on both arms and also legs.   LABORATORY PANEL:   CBC Recent Labs  Lab 09/28/18 0359  WBC 16.6*  HGB 13.2  HCT 43.2  PLT 173   ------------------------------------------------------------------------------------------------------------------  Chemistries  Recent Labs  Lab 09/27/18 1834 09/28/18 0359  NA 133* 135  K 4.7 4.0  CL 101 108  CO2 22 20*  GLUCOSE 131* 148*  BUN 15 21  CREATININE 0.99 1.04  CALCIUM 9.7 8.3*  AST 19  --   ALT 23  --   ALKPHOS 76  --   BILITOT 1.0  --    ------------------------------------------------------------------------------------------------------------------  Cardiac Enzymes No results for input(s): TROPONINI in the last 168 hours. ------------------------------------------------------------------------------------------------------------------  RADIOLOGY:  Dg Chest 2 View  Result Date: 09/27/2018 CLINICAL DATA:  Fever, elevated white blood count, confusion and urinary incontinence. EXAM: CHEST - 2 VIEW COMPARISON:  None. FINDINGS: The heart size and mediastinal contours are within normal limits. Both lungs are clear. The visualized skeletal structures are unremarkable. IMPRESSION: No active cardiopulmonary disease. Electronically Signed   By: Lorriane Shire M.D.   On: 09/27/2018 19:19  Dg Abd 1  View  Result Date: 09/27/2018 CLINICAL DATA:  Abdominal distension. EXAM: ABDOMEN - 1 VIEW COMPARISON:  Included portion from chest radiograph earlier this day. FINDINGS: Tear abdomen not included in the field of view, particularly the upper abdomen is excluded. Large volume of stool in the ascending, transverse, and descending colon. Gaseous distension of sigmoid colon. No abnormal rectal distention. No significant small bowel dilatation. Limited assessment for free air. Four clustered rounded densities projecting over the right mid abdomen may be gallstones or external to the patient. IMPRESSION: 1. Large volume of colonic stool suggesting constipation. No evidence of small bowel obstruction. 2. Portions of the abdomen are excluded from the field of view. 3. Four clustered rounded densities projecting over the right mid abdomen may be gallstones or external to the patient. Electronically Signed   By: Keith Rake M.D.   On: 09/27/2018 23:00   Ct Head Wo Contrast  Result Date: 09/28/2018 CLINICAL DATA:  Initial evaluation for acute confusion, fever, leukocytosis. EXAM: CT HEAD WITHOUT CONTRAST TECHNIQUE: Contiguous axial images were obtained from the base of the skull through the vertex without intravenous contrast. COMPARISON:  Prior CT from 07/22/2017 FINDINGS: Brain: Generalized age-related cerebral atrophy with moderate remote bilateral cerebellar infarcts noted. No acute intracranial hemorrhage. No acute large vessel territory infarct. No mass lesion, midline shift or mass effect. No hydrocephalus. No extra-axial fluid collection. Chronic small vessel ischemic disease. Vascular: No hyperdense vessel. Scattered vascular calcifications noted within the carotid siphons. Skull: Scalp soft tissues and calvarium within normal limits. Sinuses/Orbits: Globes and orbital soft tissues are normal. Visualized paranasal sinuses and mastoid air cells are clear. Other: None. IMPRESSION: 1. No acute intracranial  abnormality. 2. Generalized age-related cerebral atrophy with chronic small vessel ischemic disease and remote bilateral cerebellar infarcts. Electronically Signed   By: Jeannine Boga M.D.   On: 09/28/2018 00:36    EKG:  No orders found for this or any previous visit.  ASSESSMENT AND PLAN:   78 year old male with past medical history significant for sleep apnea on CPAP, A. fib on Eliquis, diabetes, hypertension, GERD, hyperlipidemia presents to hospital secondary to sepsis.  1.  Sepsis-patient met sepsis criteria on admission. -Likely secondary to left forearm cellulitis after traumatic lacerations from falling into the bushes. -Keep the left arm elevated.  Follow blood cultures.  Procalcitonin is negative.  Chest x-ray is clear and urine with no infection. -Discontinue vancomycin, cefepime and Flagyl.  Start Ancef. Cover test is negative, flu PCR is negative as well.  2.  Paroxysmal A. fib-continue atenolol.  On Eliquis for anticoagulation  3.  Sleep apnea-continue CPAP  4.  GERD-on PPI  5.  Does have been already on Eliquis.  Independent at baseline   All the records are reviewed and case discussed with Care Management/Social Workerr. Management plans discussed with the patient, family and they are in agreement.  CODE STATUS: Full code  TOTAL TIME TAKING CARE OF THIS PATIENT: 38 minutes.   POSSIBLE D/C IN 2 DAYS, DEPENDING ON CLINICAL CONDITION.   Gladstone Lighter M.D on 09/28/2018 at 1:45 PM  Between 7am to 6pm - Pager - [phone]  After 6pm go to [email or web address] - password [email or web address] [facility]  Office  [phone]  CC: Primary care physician; Rosalia Hammers, MD

## 2018-09-28 NOTE — Plan of Care (Signed)
  Problem: Education: Goal: Knowledge of General Education information will improve Description: Including pain rating scale, medication(s)/side effects and non-pharmacologic comfort measures Outcome: Progressing Note: Patient admitted about 0100. No complaints of pain. Patient is alert and oriented, completed patient profile. Patient admits to falling while cutting back some briar bush. Patient has a rash on both arms along wit some scratches. Patient states he is a DNR,DNI, Rufina Falco NP is aware and will confirm with wife.

## 2018-09-28 NOTE — Progress Notes (Signed)
RN called to ask that pt be assessed for need of breathing treatment.  Patient is 98% on room air.  Lung sounds are clear. Patient states that hs is fine. Treatment not need at this time.

## 2018-09-28 NOTE — Evaluation (Signed)
Physical Therapy Evaluation Patient Details Name: William Keith MRN: [id number] DOB: Jun 04, 1940 Today's Date: 09/28/2018   History of Present Illness  78 year old male with past medical history significant for sleep apnea on CPAP, A. fib on Eliquis, diabetes, hypertension, GERD, hyperlipidemia presents to hospital secondary to sepsis.    Clinical Impression  Pt eager to get up and do some walking with PT, ultimately did well though he is not back at his baseline. He was able to ambulate ~120 ft with walking stick (uses this at baseline on variable surfaces).  He had some mild unsteadiness that did not require direct assist to remain safe but was concerning given his penchant [place] ramble by[place].  Pt's O2 remained in the high 90s t/o the session, HR stable in the 80-100s.  He has h/o cerebellar infarct and concussion and has been working with vestibular PT, which remains appropriate given his presentation this date.      Follow Up Recommendations Outpatient PT(has been doing vestibular PT at [facility] )    Equipment Recommendations  None recommended by PT    Recommendations for Other Services       Precautions / Restrictions Precautions Precautions: Fall Restrictions Weight Bearing Restrictions: No      Mobility  Bed Mobility Overal bed mobility: Independent             General bed mobility comments: Pt was able to get himself to EOB w/o assist  Transfers Overall transfer level: Independent Equipment used: Rolling walker (2 wheeled)             General transfer comment: Pt is able to rise to standing without physical assist, showed some initial unsteadiness that he was easily able to manage w/o phyiscal assist  Ambulation/Gait Ambulation/Gait assistance: Supervision Gait Distance (Feet): 120 Feet Assistive device: (walking stick)       General Gait Details: Pt able to ambulate with good confidence but did fatigue with the effort.  His O2 remained  in the high 90s on room air t/o the session, HR stable.  Pt with increased steadiness with increased time in standing and overall reports not being too far from his baseline.    Stairs            Wheelchair Mobility    Modified Rankin (Stroke Patients Only)       Balance Overall balance assessment: Modified Independent(needs walking stick, encouraged possible RW use with amb)                                           Pertinent Vitals/Pain Pain Assessment: (reports some mild pain in L forearm )    Home Living Family/patient expects to be discharged to:: Private residence Living Arrangements: Spouse/significant other Available Help at Discharge: Family Type of Home: Independent living facility Home Access: Level entry     Home Layout: One level Home Equipment: (has multiple walking sticks)      Prior Function Level of Independence: Independent with assistive device(s)         Comments: Pt reports that he walked [place] nearly daily, able to be relatively active.  Has had a few falls in the last 6 months.     Hand Dominance        Extremity/Trunk Assessment   Upper Extremity Assessment Upper Extremity Assessment: Overall WFL for tasks  assessed;Generalized weakness(strength = bilaterally, functional in limited ROM, cerv sxs )    Lower Extremity Assessment Lower Extremity Assessment: Overall WFL for tasks assessed;Generalized weakness(WFL b/l, L appears slighly stronger)       Communication   Communication: No difficulties  Cognition Arousal/Alertness: Awake/alert Behavior During Therapy: WFL for tasks assessed/performed Overall Cognitive Status: Within Functional Limits for tasks assessed                                        General Comments      Exercises     Assessment/Plan    PT Assessment Patient needs continued PT services  PT Problem List Decreased strength;Decreased range of  motion;Decreased activity tolerance;Decreased balance;Decreased mobility;Decreased coordination;Decreased knowledge of use of DME;Decreased safety awareness;Decreased knowledge of precautions       PT Treatment Interventions DME instruction;Gait training;Stair training;Functional mobility training;Therapeutic activities;Balance training;Therapeutic exercise;Neuromuscular re-education;Patient/family education    PT Goals (Current goals can be found in the Care Plan section)  Acute Rehab PT Goals Patient Stated Goal: get back to daily outdoor walking PT Goal Formulation: With patient Time For Goal Achievement: 10/12/18 Potential to Achieve Goals: Good    Frequency Min 2X/week   Barriers to discharge        Co-evaluation               AM-PAC PT "6 Clicks" Mobility  Outcome Measure Help needed turning from your back to your side while in a flat bed without using bedrails?: None Help needed moving from lying on your back to sitting on the side of a flat bed without using bedrails?: None Help needed moving to and from a bed to a chair (including a wheelchair)?: None Help needed standing up from a chair using your arms (e.g., wheelchair or bedside chair)?: None Help needed to walk in hospital room?: A Little Help needed climbing 3-5 steps with a railing? : A Little 6 Click Score: 22    End of Session Equipment Utilized During Treatment: Gait belt Activity Tolerance: Patient tolerated treatment well;Patient limited by fatigue Patient left: with chair alarm set;with call bell/phone within reach Nurse Communication: Mobility status PT Visit Diagnosis: Dizziness and giddiness (R42);Difficulty in walking, not elsewhere classified (R26.2);Muscle weakness (generalized) (M62.81)    Time: 1415-1440 PT Time Calculation (min) (ACUTE ONLY): 25 min   Charges:   PT Evaluation $PT Eval Low Complexity: 1 Low PT Treatments $Gait Training: 8-22 mins        Kreg Shropshire,  DPT 09/28/2018, 3:45 PM

## 2018-09-28 NOTE — Plan of Care (Signed)
No c/o pain, patient ambulating with 1 assist, tolerating well.   Problem: Pain Managment: Goal: General experience of comfort will improve Outcome: Progressing   Problem: Safety: Goal: Ability to remain free from injury will improve Outcome: Progressing

## 2018-09-28 NOTE — Progress Notes (Signed)
Pharmacy Antibiotic Note  William Keith is a 78 y.o. male admitted on 09/27/2018 with sepsis.  Pharmacy has been consulted for vanc/cefepime dosing.  Plan: Patient received vanc 2.5g IV load in ED + cefepime 2g, flagyl 500 mg IV x 1  Vancomycin 1500 mg IV Q 24 hrs. Goal AUC 400-550. Expected AUC: 526.9 SCr used: 0.99 Cssmin: 11.7  Will continue cefepime 2g IV q8h per CrCl > 60 ml/min and flagyl 500 mg IV q8h. Will continue to monitor for s/sx of infx as well as renal function and adjust as needed.  Height: 5\' 8"  (172.7 cm) Weight: 233 lb 6.4 oz (105.9 kg) IBW/kg (Calculated) : 68.4  Temp (24hrs), Avg:101.3 F (38.5 C), Min:98.4 F (36.9 C), Max:103 F (39.4 C)  Recent Labs  Lab 09/27/18 1834 09/27/18 2220 09/28/18 0022  WBC 11.7*  --   --   CREATININE 0.99  --   --   LATICACIDVEN 1.6 1.0 1.4    Estimated Creatinine Clearance: 72.5 mL/min (by C-G formula based on SCr of 0.99 mg/dL).    Allergies  Allergen Reactions  . Dilaudid [Hydromorphone Hcl] Other (See Comments)    hallucination    Thank you for allowing pharmacy to be a part of this patient's care.  Tobie Lords, PharmD, BCPS Clinical Pharmacist 09/28/2018 1:36 AM

## 2018-09-28 NOTE — Progress Notes (Signed)
Patient c/o shortness of breath at rest,  O2 sats checked, 100 % on room air, lungs clear. MD notified, orders given for PRN duonebs. Will continue to monitor.

## 2018-09-28 NOTE — Care Management Important Message (Signed)
Important Message  Patient Details  Name: William Keith MRN: [id number] Date of Birth: 09-Jun-1940   Medicare Important Message Given:  Yes  Initial Medicare IM given by Patient Access Associate on 09/28/2018 at 11:50am.    Dannette Barbara 09/28/2018, 1:25 PM

## 2018-09-29 LAB — CBC
HCT: 39.2 % (ref 39.0–52.0)
Hemoglobin: 12.7 g/dL — ABNORMAL LOW (ref 13.0–17.0)
MCH: 28.6 pg (ref 26.0–34.0)
MCHC: 32.4 g/dL (ref 30.0–36.0)
MCV: 88.3 fL (ref 80.0–100.0)
Platelets: 178 10*3/uL (ref 150–400)
RBC: 4.44 MIL/uL (ref 4.22–5.81)
RDW: 14.6 % (ref 11.5–15.5)
WBC: 8.7 10*3/uL (ref 4.0–10.5)
nRBC: 0 % (ref 0.0–0.2)

## 2018-09-29 LAB — CREATININE, SERUM
Creatinine, Ser: 0.86 mg/dL (ref 0.61–1.24)
GFR calc Af Amer: >60 mL/min (ref >60)
GFR calc non Af Amer: >60 mL/min (ref >60)

## 2018-09-29 LAB — PROCALCITONIN: Procalcitonin: 2.24 ng/mL

## 2018-09-29 MED ORDER — CEPHALEXIN 500 MG PO CAPS: 500.0000 mg | ORAL_CAPSULE | Freq: Four times a day (QID) | ORAL | 0 refills | Status: AC

## 2018-09-29 NOTE — TOC Initial Note (Signed)
Transition of Care [facility]) - Initial/Assessment Note    Patient Details  Name: William Keith MRN: [id number] Date of Birth: [birth year]  [facility]) CM/SW Contact:    Elza Rafter, RN Phone Number: 09/29/2018, 3:05 PM  Clinical Narrative:   Patient is discharging today to home.  Lives with wife; independent in all ADL's.  Current with PCP; obtains medications at [facility] without difficulty.  Offered home health services and he is declining.  Uses a cane.  No further needs identified.                    Expected Discharge Plan: Home/Self Care Barriers to Discharge: No Barriers Identified   Patient Goals and CMS Choice Patient states their goals for this hospitalization and ongoing recovery are:: discharge home      Expected Discharge Plan and Services Expected Discharge Plan: Home/Self Care       Living arrangements for the past 2 months: Single Family Home Expected Discharge Date: 09/29/18                         HH Arranged: Refused HH          Prior Living Arrangements/Services Living arrangements for the past 2 months: Single Family Home Lives with:: Spouse                   Activities of Daily Living Home Assistive Devices/Equipment: Cane (specify quad or straight), CBG Meter, CPAP, Eyeglasses, Scales, Grab bars in shower, Grab bars around toilet ADL Screening (condition at time of admission) Patient's cognitive ability adequate to safely complete daily activities?: Yes Is the patient deaf or have difficulty hearing?: No Does the patient have difficulty seeing, even when wearing glasses/contacts?: No Does the patient have difficulty concentrating, remembering, or making decisions?: No Patient able to express need for assistance with ADLs?: Yes Does the patient have difficulty dressing or bathing?: No Independently performs ADLs?: Yes (appropriate for developmental age) Does the patient have difficulty walking or climbing stairs?:  Yes Weakness of Legs: Both Weakness of Arms/Hands: None  Permission Sought/Granted                  Emotional Assessment Appearance:: Appears stated age Attitude/Demeanor/Rapport: Gracious Affect (typically observed): Accepting Orientation: : Oriented to Self, Oriented to Place, Oriented to  Time, Oriented to Situation Alcohol / Substance Use: Not Applicable    Admission diagnosis:  Abdominal distension [R14.0] Sepsis, due to unspecified organism, unspecified whether acute organ dysfunction present [facility]) [A41.9] Patient Active Problem List   Diagnosis Date Noted  . Sepsis ([place]) 09/27/2018   PCP:  Rosalia Hammers, MD Pharmacy:   [place], [place] [address] Phone: [phone] Fax: [phone]     Social Determinants of Health (SDOH) Interventions    Readmission Risk Interventions No flowsheet data found.

## 2018-09-29 NOTE — Discharge Summary (Signed)
[facility] NAME: William Keith    MR#:  [id number]  DATE OF BIRTH:  02-22-1940  DATE OF ADMISSION:  09/27/2018   ADMITTING PHYSICIAN: Lang Snow, NP  DATE OF DISCHARGE: 09/29/18  PRIMARY CARE PHYSICIAN: Rosalia Hammers, MD   ADMISSION DIAGNOSIS:   Abdominal distension [R14.0] Sepsis, due to unspecified organism, unspecified whether acute organ dysfunction present ([place]) [A41.9]  DISCHARGE DIAGNOSIS:   Active Problems:   Sepsis ([place])   SECONDARY DIAGNOSIS:   Past Medical History:  Diagnosis Date  . Atrial fibrillation ([place])   . Hypercholesteremia   . Neck pain   . Stroke [facility])     HOSPITAL COURSE:   78 year old male with past medical history significant for sleep apnea on CPAP, A. fib on Eliquis, diabetes, hypertension, GERD, hyperlipidemia presents to hospital secondary to sepsis.  1.  Sepsis- patient met sepsis criteria on admission. -secondary to left forearm cellulitis after traumatic lacerations from falling into the bushes. -Keep the left arm elevated.  Ace wrap  for the swelling to go down  -Blood cultures have been negative.  No other source of infection identified. Chest x-ray is clear and urine with no infection. -Initially started on broad-spectrum antibiotics, then narrowed to Ancef.  Will be discharged on oral Keflex.  Instructions given both to patient and also his wife regarding worsening of symptoms and when he will need to come back to the ER COVID-19 test is negative, flu PCR is negative as well. -Only low-grade fevers today.  Continue to monitor at home as well.  2.  Paroxysmal A. fib-continue atenolol.  On Eliquis for anticoagulation  3.  Sleep apnea-continue CPAP  4.    Hypertension-on losartan and atenolol  5.  GERD-PPI  6.  Traumatic concussion injury-continue to follow-up with vestibular PT as outpatient   Independent at baseline.  Ambulated well with physical  therapy.  Patient very anxious and wants to get discharged today Wife updated over the phone  DISCHARGE CONDITIONS:   Guarded  CONSULTS OBTAINED:   None  DRUG ALLERGIES:   Allergies  Allergen Reactions  . Dilaudid [Hydromorphone Hcl] Other (See Comments)    hallucination   DISCHARGE MEDICATIONS:   Allergies as of 09/29/2018      Reactions   Dilaudid [hydromorphone Hcl] Other (See Comments)   hallucination      Medication List    TAKE these medications   acetaminophen 325 MG tablet Commonly known as: TYLENOL Take 650 mg by mouth every 6 (six) hours as needed for mild pain, fever or headache.   aspirin EC 81 MG tablet Take 81 mg by mouth daily.   atenolol 25 MG tablet Commonly known as: TENORMIN Take 75 mg by mouth daily.   atorvastatin 80 MG tablet Commonly known as: LIPITOR Take 80 mg by mouth daily.   buPROPion 150 MG 12 hr tablet Commonly known as: WELLBUTRIN SR Take 150 mg by mouth daily.   cephALEXin 500 MG capsule Commonly known as: Keflex Take 1 capsule (500 mg total) by mouth 4 (four) times daily for 7 days.   Ciclopirox 1 % shampoo Apply 1 application topically daily as needed (scalp irritation).   cyclobenzaprine 10 MG tablet Commonly known as: FLEXERIL Take 10 mg by mouth 3 (three) times daily as needed for muscle spasms.   D2000 Ultra Strength 50 MCG (2000 UT) Caps Generic drug: Cholecalciferol Take 2,000 Units by mouth daily.   desonide 0.05 % cream Commonly known  as: DESOWEN Apply 1 application topically as needed for rash.   DULoxetine 60 MG capsule Commonly known as: CYMBALTA Take 60 mg by mouth daily.   Eliquis 5 MG Tabs tablet Generic drug: apixaban Take 5 mg by mouth 2 (two) times daily.   fluticasone 50 MCG/ACT nasal spray Commonly known as: FLONASE Place 2 sprays into the nose 2 (two) times daily as needed for rhinitis.   gabapentin 300 MG capsule Commonly known as: NEURONTIN Take 600 mg by mouth 3 (three) times  daily.   losartan 50 MG tablet Commonly known as: COZAAR Take 25 mg by mouth daily.   multivitamin with minerals tablet Take 1 tablet by mouth daily.   nitroGLYCERIN 0.4 MG/SPRAY spray Commonly known as: NITROLINGUAL Place 1 spray under the tongue every 5 (five) minutes x 3 doses as needed for chest pain.   omeprazole 40 MG capsule Commonly known as: PRILOSEC Take 40 mg by mouth daily.   oxyCODONE 5 MG immediate release tablet Commonly known as: Oxy IR/ROXICODONE Take 5 mg by mouth every 8 (eight) hours as needed (pain).   topiramate 50 MG tablet Commonly known as: TOPAMAX Take 50 mg by mouth at bedtime.        DISCHARGE INSTRUCTIONS:   1. PCP f/u in 1-2 weeks  DIET:   Cardiac diet  ACTIVITY:   Activity as tolerated  OXYGEN:   Home Oxygen: No.  Oxygen Delivery: room air  DISCHARGE LOCATION:   home   If you experience worsening of your admission symptoms, develop shortness of breath, life threatening emergency, suicidal or homicidal thoughts you must seek medical attention immediately by calling 911 or calling your MD immediately  if symptoms less severe.  You Must read complete instructions/literature along with all the possible adverse reactions/side effects for all the Medicines you take and that have been prescribed to you. Take any new Medicines after you have completely understood and accpet all the possible adverse reactions/side effects.   Please note  You were cared for by a hospitalist during your hospital stay. If you have any questions about your discharge medications or the care you received while you were in the hospital after you are discharged, you can call the unit and asked to speak with the hospitalist on call if the hospitalist that took care of you is not available. Once you are discharged, your primary care physician will handle any further medical issues. Please note that NO REFILLS for any discharge medications will be authorized once you  are discharged, as it is imperative that you return to your primary care physician (or establish a relationship with a primary care physician if you do not have one) for your aftercare needs so that they can reassess your need for medications and monitor your lab values.    On the day of Discharge:  VITAL SIGNS:   Blood pressure (!) 125/57, pulse 81, temperature 97.9 F (36.6 C), temperature source Oral, resp. rate 18, height _0  (1.727 m), weight 107.4 kg, SpO2 96 %.  PHYSICAL EXAMINATION:   GENERAL:  77 y.o.-year-old patient lying in the bed with no acute distress.  EYES: Pupils equal, round, reactive to light and accommodation. No scleral icterus. Extraocular muscles intact.  HEENT: Head atraumatic, normocephalic. Oropharynx and nasopharynx clear.  NECK:  Supple, no jugular venous distention. No thyroid enlargement, no tenderness.  LUNGS: Normal breath sounds bilaterally, no wheezing, rales,rhonchi or crepitation. No use of accessory muscles of respiration.  Decreased bibasilar breath sounds CARDIOVASCULAR: S1, S2  normal. No murmurs, rubs, or gallops.  ABDOMEN: Soft, nontender, nondistended. Bowel sounds present. No organomegaly or mass.  EXTREMITIES: No pedal edema, cyanosis, or clubbing.  Significant swelling and erythema of the left forearm. NEUROLOGIC: Cranial nerves II through XII are intact. Muscle strength 5/5 in all extremities. Sensation intact. Gait not checked.  PSYCHIATRIC: The patient is alert and oriented x 3.  SKIN: No obvious rash, lesion, or ulcer.  Superficial linear lacerations noted on both arms and also legs.  DATA REVIEW:   CBC Recent Labs  Lab 09/29/18 0612  WBC 8.7  HGB 12.7*  HCT 39.2  PLT 178    Chemistries  Recent Labs  Lab 09/27/18 1834 09/28/18 0359 09/29/18 0612  NA 133* 135  --   K 4.7 4.0  --   CL 101 108  --   CO2 22 20*  --   GLUCOSE 131* 148*  --   BUN 15 21  --   CREATININE 0.99 1.04 0.86  CALCIUM 9.7 8.3*  --   AST 19  --    --   ALT 23  --   --   ALKPHOS 76  --   --   BILITOT 1.0  --   --      Microbiology Results  Results for orders placed or performed during the hospital encounter of 09/27/18  Culture, blood (Routine x 2)     Status: None (Preliminary result)   Collection Time: 09/27/18  6:34 PM   Specimen: BLOOD  Result Value Ref Range Status   Specimen Description BLOOD LEFT ANTECUBITAL  Final   Special Requests   Final    BOTTLES DRAWN AEROBIC AND ANAEROBIC Blood Culture adequate volume   Culture   Final    NO GROWTH 2 DAYS Performed at [facility], [address], [place], [place] [address]    Report Status PENDING  Incomplete  SARS Coronavirus 2 [facility] order, Performed in [place] hospital lab) Nasopharyngeal Nasopharyngeal Swab     Status: None   Collection Time: 09/27/18  7:53 PM   Specimen: Nasopharyngeal Swab  Result Value Ref Range Status   SARS Coronavirus 2 NEGATIVE NEGATIVE Final    Comment: (NOTE) If result is NEGATIVE SARS-CoV-2 target nucleic acids are NOT DETECTED. The SARS-CoV-2 RNA is generally detectable in upper and lower  respiratory specimens during the acute phase of infection. The lowest  concentration of SARS-CoV-2 viral copies this assay can detect is 250  copies / mL. A negative result does not preclude SARS-CoV-2 infection  and should not be used as the sole basis for treatment or other  patient management decisions.  A negative result may occur with  improper specimen collection / handling, submission of specimen other  than nasopharyngeal swab, presence of viral mutation(s) within the  areas targeted by this assay, and inadequate number of viral copies  (<250 copies / mL). A negative result must be combined with clinical  observations, patient history, and epidemiological information. If result is POSITIVE SARS-CoV-2 target nucleic acids are DETECTED. The SARS-CoV-2 RNA is generally detectable in upper and lower  respiratory specimens dur ing  the acute phase of infection.  Positive  results are indicative of active infection with SARS-CoV-2.  Clinical  correlation with patient history and other diagnostic information is  necessary to determine patient infection status.  Positive results do  not rule out bacterial infection or co-infection with other viruses. If result is PRESUMPTIVE POSTIVE SARS-CoV-2 nucleic acids MAY BE PRESENT.  A presumptive positive result was obtained on the submitted specimen  and confirmed on repeat testing.  While 2019 novel coronavirus  (SARS-CoV-2) nucleic acids may be present in the submitted sample  additional confirmatory testing may be necessary for epidemiological  and / or clinical management purposes  to differentiate between  SARS-CoV-2 and other Sarbecovirus currently known to infect humans.  If clinically indicated additional testing with an alternate test  methodology [phone]) is advised. The SARS-CoV-2 RNA is generally  detectable in upper and lower respiratory sp ecimens during the acute  phase of infection. The expected result is Negative. Fact Sheet for Patients:  StrictlyIdeas.no Fact Sheet for Healthcare Providers: BankingDealers.co.za This test is not yet approved or cleared by the [place] FDA and has been authorized for detection and/or diagnosis of SARS-CoV-2 by FDA under an Emergency Use Authorization (EUA).  This EUA will remain in effect (meaning this test can be used) for the duration of the COVID-19 declaration under Section 564(b)(1) of the Act, 21 U.S.C. section 360bbb-3(b)(1), unless the authorization is terminated or revoked sooner. Performed at [facility], [address] [place], [place] [address]   Culture, blood (Routine x 2)     Status: None (Preliminary result)   Collection Time: 09/27/18  8:04 PM   Specimen: BLOOD  Result Value Ref Range Status   Specimen Description BLOOD RIGHT ANTECUBITAL   Final   Special Requests   Final    BOTTLES DRAWN AEROBIC AND ANAEROBIC Blood Culture results may not be optimal due to an excessive volume of blood received in culture bottles   Culture   Final    NO GROWTH 2 DAYS Performed at [facility], [address], [place], [place] [address]    Report Status PENDING  Incomplete  MRSA PCR Screening     Status: None   Collection Time: 09/28/18 10:00 AM   Specimen: Nasopharyngeal  Result Value Ref Range Status   MRSA by PCR NEGATIVE NEGATIVE Final    Comment:        The GeneXpert MRSA Assay (FDA approved for NASAL specimens only), is one component of a comprehensive MRSA colonization surveillance program. It is not intended to diagnose MRSA infection nor to guide or monitor treatment for MRSA infections. Performed at [facility], [address], [place], [place] [address]     RADIOLOGY:  No results found.   Management plans discussed with the patient, family and they are in agreement.  CODE STATUS:     Code Status Orders  (From admission, onward)         Start     Ordered   09/27/18 2239  Full code  Continuous     09/27/18 2241        Code Status History    This patient has a current code status but no historical code status.   Advance Care Planning Activity    Advance Directive Documentation     Most Recent Value  Type of Advance Directive  Out of facility DNR (pink MOST or yellow form)  Pre-existing out of facility DNR order (yellow form or pink MOST form)  -  "MOST" Form in Place?  -      TOTAL TIME TAKING CARE OF THIS PATIENT: 38 minutes.    Gladstone Lighter M.D on 09/29/2018 at 2:20 PM  Between 7am to 6pm - Pager - [phone]  After 6pm go to [email or web address] - Proofreader  [employer]  [phone]  CC: Primary care physician; Rosalia Hammers, MD   Note: This dictation was prepared with Dragon dictation along with smaller phrase  technology. Any transcriptional errors that result from this process are unintentional.

## 2018-09-29 NOTE — Discharge Instructions (Signed)
1.  Keep the left arm elevated on 2 pillows, above heart level. 2.  Gentle Ace wrap to left arm starting at mid forearm and ending at mid arm level.  Open Ace bandage every day and inspect the wounds.  Goal is to get the swelling down. 3.  If worsening fevers, confusion and chills, worsening erythema/redness of the left forearm-recommended to come back to the emergency room

## 2018-10-02 LAB — CULTURE, BLOOD (ROUTINE X 2)
Culture: NO GROWTH
Culture: NO GROWTH
Special Requests: ADEQUATE

## 2018-10-03 DIAGNOSIS — M1711 Unilateral primary osteoarthritis, right knee: Secondary | ICD-10-CM | POA: Insufficient documentation

## 2018-11-10 DIAGNOSIS — E118 Type 2 diabetes mellitus with unspecified complications: Secondary | ICD-10-CM | POA: Insufficient documentation

## 2019-09-27 DIAGNOSIS — Z6835 Body mass index (BMI) 35.0-35.9, adult: Secondary | ICD-10-CM | POA: Insufficient documentation

## 2019-12-13 ENCOUNTER — Other Ambulatory Visit: Payer: Self-pay

## 2019-12-13 ENCOUNTER — Emergency Department (HOSPITAL_COMMUNITY): Payer: Medicare Other

## 2019-12-13 ENCOUNTER — Inpatient Hospital Stay (HOSPITAL_COMMUNITY)
Admission: EM | Admit: 2019-12-13 | Discharge: 2019-12-18 | DRG: 605 | Disposition: A | Discharge status: Home Health | Payer: Medicare Other | Attending: Internal Medicine | Admitting: Internal Medicine

## 2019-12-13 DIAGNOSIS — Y92009 Unspecified place in unspecified non-institutional (private) residence as the place of occurrence of the external cause: Secondary | ICD-10-CM

## 2019-12-13 DIAGNOSIS — N400 Enlarged prostate without lower urinary tract symptoms: Secondary | ICD-10-CM | POA: Diagnosis present

## 2019-12-13 DIAGNOSIS — F17200 Nicotine dependence, unspecified, uncomplicated: Secondary | ICD-10-CM | POA: Diagnosis present

## 2019-12-13 DIAGNOSIS — R519 Headache, unspecified: Secondary | ICD-10-CM

## 2019-12-13 DIAGNOSIS — I10 Essential (primary) hypertension: Secondary | ICD-10-CM | POA: Diagnosis present

## 2019-12-13 DIAGNOSIS — I4891 Unspecified atrial fibrillation: Secondary | ICD-10-CM | POA: Diagnosis present

## 2019-12-13 DIAGNOSIS — G4733 Obstructive sleep apnea (adult) (pediatric): Secondary | ICD-10-CM | POA: Diagnosis present

## 2019-12-13 DIAGNOSIS — Z8673 Personal history of transient ischemic attack (TIA), and cerebral infarction without residual deficits: Secondary | ICD-10-CM

## 2019-12-13 DIAGNOSIS — Z20822 Contact with and (suspected) exposure to covid-19: Secondary | ICD-10-CM | POA: Diagnosis present

## 2019-12-13 DIAGNOSIS — E872 Acidosis: Secondary | ICD-10-CM | POA: Diagnosis present

## 2019-12-13 DIAGNOSIS — W010XXA Fall on same level from slipping, tripping and stumbling without subsequent striking against object, initial encounter: Secondary | ICD-10-CM | POA: Diagnosis present

## 2019-12-13 DIAGNOSIS — A419 Sepsis, unspecified organism: Secondary | ICD-10-CM | POA: Diagnosis present

## 2019-12-13 DIAGNOSIS — S0181XA Laceration without foreign body of other part of head, initial encounter: Principal | ICD-10-CM | POA: Diagnosis present

## 2019-12-13 DIAGNOSIS — R531 Weakness: Secondary | ICD-10-CM

## 2019-12-13 DIAGNOSIS — Z7901 Long term (current) use of anticoagulants: Secondary | ICD-10-CM

## 2019-12-13 DIAGNOSIS — E7800 Pure hypercholesterolemia, unspecified: Secondary | ICD-10-CM | POA: Diagnosis present

## 2019-12-13 DIAGNOSIS — R040 Epistaxis: Secondary | ICD-10-CM | POA: Diagnosis present

## 2019-12-13 DIAGNOSIS — G894 Chronic pain syndrome: Secondary | ICD-10-CM | POA: Diagnosis present

## 2019-12-13 DIAGNOSIS — D72829 Elevated white blood cell count, unspecified: Secondary | ICD-10-CM

## 2019-12-13 DIAGNOSIS — I2510 Atherosclerotic heart disease of native coronary artery without angina pectoris: Secondary | ICD-10-CM | POA: Diagnosis present

## 2019-12-13 DIAGNOSIS — R509 Fever, unspecified: Secondary | ICD-10-CM

## 2019-12-13 DIAGNOSIS — E785 Hyperlipidemia, unspecified: Secondary | ICD-10-CM | POA: Diagnosis present

## 2019-12-13 DIAGNOSIS — E119 Type 2 diabetes mellitus without complications: Secondary | ICD-10-CM | POA: Diagnosis present

## 2019-12-13 DIAGNOSIS — Z79899 Other long term (current) drug therapy: Secondary | ICD-10-CM

## 2019-12-13 DIAGNOSIS — Z6833 Body mass index (BMI) 33.0-33.9, adult: Secondary | ICD-10-CM

## 2019-12-13 DIAGNOSIS — Z888 Allergy status to other drugs, medicaments and biological substances status: Secondary | ICD-10-CM

## 2019-12-13 HISTORY — DX: Cardiac arrhythmia, unspecified: I49.9

## 2019-12-13 HISTORY — DX: Type 2 diabetes mellitus without complications: E11.9

## 2019-12-13 HISTORY — DX: Essential (primary) hypertension: I10

## 2019-12-13 LAB — CBC WITH DIFFERENTIAL/PLATELET
Abs Immature Granulocytes: 0.14 10*3/uL — ABNORMAL HIGH (ref 0.00–0.07)
Basophils Absolute: 0 10*3/uL (ref 0.0–0.1)
Basophils Relative: 0 %
Eosinophils Absolute: 0 10*3/uL (ref 0.0–0.5)
Eosinophils Relative: 0 %
HCT: 50.5 % (ref 39.0–52.0)
Hemoglobin: 16 g/dL (ref 13.0–17.0)
Immature Granulocytes: 1 %
Lymphocytes Relative: 4 %
Lymphs Abs: 0.6 10*3/uL — ABNORMAL LOW (ref 0.7–4.0)
MCH: 28.5 pg (ref 26.0–34.0)
MCHC: 31.7 g/dL (ref 30.0–36.0)
MCV: 90 fL (ref 80.0–100.0)
Monocytes Absolute: 1 10*3/uL (ref 0.1–1.0)
Monocytes Relative: 6 %
Neutro Abs: 15.2 10*3/uL — ABNORMAL HIGH (ref 1.7–7.7)
Neutrophils Relative %: 89 %
Platelets: 213 10*3/uL (ref 150–400)
RBC: 5.61 MIL/uL (ref 4.22–5.81)
RDW: 14.6 % (ref 11.5–15.5)
WBC: 17 10*3/uL — ABNORMAL HIGH (ref 4.0–10.5)
nRBC: 0 % (ref 0.0–0.2)

## 2019-12-13 LAB — COMPREHENSIVE METABOLIC PANEL
ALT: 25 U/L (ref 0–44)
AST: 26 U/L (ref 15–41)
Albumin: 3.7 g/dL (ref 3.5–5.0)
Alkaline Phosphatase: 106 U/L (ref 38–126)
Anion gap: 13 (ref 5–15)
BUN: 16 mg/dL (ref 8–23)
CO2: 19 mmol/L — ABNORMAL LOW (ref 22–32)
Calcium: 9.5 mg/dL (ref 8.9–10.3)
Chloride: 103 mmol/L (ref 98–111)
Creatinine, Ser: 1.08 mg/dL (ref 0.61–1.24)
GFR, Estimated: >60 mL/min (ref >60)
Glucose, Bld: 191 mg/dL — ABNORMAL HIGH (ref 70–99)
Potassium: 4.2 mmol/L (ref 3.5–5.1)
Sodium: 135 mmol/L (ref 135–145)
Total Bilirubin: 0.8 mg/dL (ref 0.3–1.2)
Total Protein: 6.5 g/dL (ref 6.5–8.1)

## 2019-12-13 LAB — RESP PANEL BY RT-PCR (FLU A&B, COVID) ARPGX2
Influenza A by PCR: NEGATIVE
Influenza B by PCR: NEGATIVE
SARS Coronavirus 2 by RT PCR: NEGATIVE

## 2019-12-13 LAB — LACTIC ACID, PLASMA: Lactic Acid, Venous: 2.9 mmol/L (ref 0.5–1.9)

## 2019-12-13 LAB — APTT: aPTT: 28 seconds (ref 24–36)

## 2019-12-13 LAB — PROTIME-INR
INR: 1.1 (ref 0.8–1.2)
Prothrombin Time: 13.6 seconds (ref 11.4–15.2)

## 2019-12-13 MED ORDER — SODIUM CHLORIDE 0.9 % IV SOLN
500.0000 mg | INTRAVENOUS | Status: DC
  Administered 2019-12-13 – 2019-12-16 (×4): 500 mg via INTRAVENOUS
  Filled 2019-12-13 (×5): qty 500

## 2019-12-13 MED ORDER — LACTATED RINGERS IV BOLUS (SEPSIS)
1000.0000 mL | Freq: Once | INTRAVENOUS | Status: AC
Start: 2019-12-13 — End: 2019-12-13
  Administered 2019-12-13: 1000 mL via INTRAVENOUS

## 2019-12-13 MED ORDER — SODIUM CHLORIDE 0.9 % IV SOLN
2.0000 g | INTRAVENOUS | Status: DC
  Administered 2019-12-13: 2 g via INTRAVENOUS
  Filled 2019-12-13: qty 20

## 2019-12-13 MED ORDER — LACTATED RINGERS IV SOLN: INTRAVENOUS | Status: AC

## 2019-12-13 NOTE — ED Notes (Signed)
Patient placed on 2L [place] 91% on RA.

## 2019-12-13 NOTE — ED Provider Notes (Signed)
[facility] EMERGENCY DEPARTMENT Provider Note   CSN: [id number] Arrival date & time: 12/13/19  2116     History Chief Complaint  Patient presents with  . Fall    1600 fall at doctors office, waxes and wines. Lac on forehead, bleeding controlled. Eliqus for afib. A0x4 at this time.    William Keith is a 79 y.o. male with a history of hypertension, atrial fibrillation (on Eliquis), OSA, type 2 diabetes, hypercholesterolemia, chronic pain syndrome.  Patient presents with a chief complaint of fall.  Patient reports that he suffered a fall at around 1600.  Patient reports that he tripped over his feet, he did hit his head, denies any loss of consciousness.  Patient reports that he had a laceration to his forehead with bleeding controlled prior to arrival at ED. patient also reports that he had a nosebleed after his fall, states that the bleeding stopped spontaneously.  Patient endorses a headache, reports it is located in across his forehead, rates it as 8/10 on the pain scale.  Patient denies any neck or back pain, nausea, vomiting, lightheadedness, dizziness, or seizures.    Patient denies any recent illness or sick contacts.  He denies any fevers, chills, shortness of breath, chest pain, abdominal pain, or dysuria.  Patient does report that is "urine stinks," and has so for several months.    HPI     Past Medical History:  Diagnosis Date  . Atrial fibrillation ([place])   . Hypercholesteremia   . Neck pain   . Stroke [facility])     Patient Active Problem List   Diagnosis Date Noted  . Sepsis ([place]) 09/27/2018    No past surgical history on file.     No family history on file.  Social History   Tobacco Use  . Smoking status: Current Every Day Smoker    Types: Pipe  . Smokeless tobacco: Never Used  Substance Use Topics  . Alcohol use: Not Currently    Home Medications Prior to Admission medications   Medication Sig Start Date End Date Taking? Authorizing  Provider  acetaminophen (TYLENOL) 325 MG tablet Take 650 mg by mouth every 6 (six) hours as needed for mild pain, fever or headache.   Yes [provider]  amitriptyline (ELAVIL) 25 MG tablet Take 25 mg by mouth at bedtime. 09/21/19 09/20/20 Yes [provider]  ARIPiprazole (ABILIFY) 5 MG tablet Take 1 tablet by mouth daily. 12/02/19  Yes [provider]  aspirin EC 81 MG tablet Take 81 mg by mouth daily.   Yes [provider]  atorvastatin (LIPITOR) 80 MG tablet Take 80 mg by mouth daily. 08/01/18  Yes [provider]  buPROPion (WELLBUTRIN SR) 150 MG 12 hr tablet Take 150 mg by mouth daily. 07/08/18  Yes [provider]  Cholecalciferol (D2000 ULTRA STRENGTH) 50 MCG (2000 UT) CAPS Take 2,000 Units by mouth daily.   Yes [provider]  diclofenac Sodium (VOLTAREN) 1 % GEL Apply 2-4 g topically 4 (four) times daily as needed. 02/05/19 02/05/20 Yes [provider]  DULoxetine (CYMBALTA) 60 MG capsule Take 2 capsules by mouth daily. 10/31/19  Yes [provider]  fluticasone (FLONASE) 50 MCG/ACT nasal spray Place 2 sprays into the nose 2 (two) times daily as needed for rhinitis. 04/13/18 12/13/19 Yes [provider]  gabapentin (NEURONTIN) 300 MG capsule Take 600 mg by mouth 3 (three) times daily. 09/09/18  Yes [provider]  losartan (COZAAR) 50 MG tablet Take  25 mg by mouth daily. 08/13/18  Yes [provider]  Multiple Vitamins-Minerals (MULTIVITAMIN WITH MINERALS) tablet Take 1 tablet by mouth at bedtime.   Yes [provider]  nitroGLYCERIN (NITROLINGUAL) 0.4 MG/SPRAY spray Place 1 spray under the tongue every 5 (five) minutes x 3 doses as needed for chest pain.   Yes [provider]  omeprazole (PRILOSEC) 40 MG capsule Take 40 mg by mouth daily. 09/06/18  Yes [provider]  oxyCODONE-acetaminophen (PERCOCET/ROXICET) 5-325 MG tablet Take 1 tablet by mouth every 6 (six) hours  as needed for moderate pain.   Yes [provider]  tamsulosin (FLOMAX) 0.4 MG CAPS capsule Take 0.4 mg by mouth at bedtime.   Yes [provider]  topiramate (TOPAMAX) 50 MG tablet Take 50 mg by mouth at bedtime. 06/18/18  Yes [provider]  albuterol (VENTOLIN HFA) 108 (90 Base) MCG/ACT inhaler Inhale 1 puff into the lungs every 4 (four) hours as needed for wheezing or shortness of breath.    [provider]  atenolol (TENORMIN) 50 MG tablet Take 50 mg by mouth daily. 11/06/19   [provider]  cyclobenzaprine (FLEXERIL) 10 MG tablet Take 10 mg by mouth 3 (three) times daily as needed for muscle spasms.    [provider]  ELIQUIS 5 MG TABS tablet Take 5 mg by mouth 2 (two) times daily. 07/11/18   [provider]  meclizine (ANTIVERT) 25 MG tablet Take 25 mg by mouth 2 (two) times daily as needed for dizziness.    [provider]  ramipril (ALTACE) 10 MG capsule Take 10 mg by mouth. Patient not taking: No sig reported    [provider]    Allergies    Dilaudid [hydromorphone hcl]  Review of Systems   Review of Systems  Constitutional: Negative for chills and fever.  Eyes: Negative for visual disturbance.  Respiratory: Negative for cough and shortness of breath.   Cardiovascular: Negative for chest pain.  Gastrointestinal: Negative for abdominal pain, nausea and vomiting.  Genitourinary: Negative for difficulty urinating and dysuria.  Musculoskeletal: Negative for back pain and neck pain.  Skin: Negative for color change and rash.  Neurological: Positive for headaches. Negative for dizziness, tremors, seizures, syncope, facial asymmetry, speech difficulty, weakness, light-headedness and numbness.  Psychiatric/Behavioral: Negative for confusion.    Physical Exam Updated Vital Signs BP 123/64   Pulse (!) 120   Temp 99.8 F (37.7 C) (Oral)   Resp (!) 27   Ht 5\' 8"  (1.727 m)   Wt 101.2 kg   SpO2 96%    BMI 33.91 kg/m   Physical Exam Vitals reviewed.  Constitutional:      General: He is not in acute distress.    Appearance: He is obese. He is ill-appearing. He is not toxic-appearing or diaphoretic.  HENT:     Head: Normocephalic. Laceration present. No raccoon eyes, Battle's sign, abrasion or contusion.  Eyes:     Pupils: Pupils are equal, round, and reactive to light.  Neck:     Comments: Patient has surgical scar to cervical neck Cardiovascular:     Rate and Rhythm: Tachycardia present.  Pulmonary:     Effort: Pulmonary effort is normal. Tachypnea present.     Breath sounds: Examination of the right-lower field reveals rales. Examination of the left-lower field reveals rales. Rales present. No wheezing or rhonchi.  Abdominal:     General: There is no distension.     Palpations: Abdomen is soft. There is  no mass or pulsatile mass.     Tenderness: There is no abdominal tenderness. There is no guarding or rebound.  Musculoskeletal:     Cervical back: Neck supple. No rigidity, tenderness, bony tenderness or crepitus. No spinous process tenderness or muscular tenderness.     Thoracic back: No deformity, tenderness or bony tenderness.     Lumbar back: No deformity, tenderness or bony tenderness.     Comments: No pain on passive range of motion of all extremities  Skin:    General: Skin is warm and dry.     Coloration: Skin is not cyanotic.     Findings: No rash.  Neurological:     General: No focal deficit present.     Mental Status: He is alert.     GCS: GCS eye subscore is 4. GCS verbal subscore is 5. GCS motor subscore is 6.     Cranial Nerves: No facial asymmetry.     Motor: No tremor or seizure activity.     Comments: +5 strength to dorsiflexion and plantar flexion, and supine position patient was able to lift each leg and hold it against gravity for 6 seconds without difficulty; + strength to bilateral upper extremities  Psychiatric:        Behavior: Behavior is  cooperative.       ED Results / Procedures / Treatments   Labs (all labs ordered are listed, but only abnormal results are displayed) Labs Reviewed  LACTIC ACID, PLASMA - Abnormal; Notable for the following components:      Result Value   Lactic Acid, Venous 2.9 (*)    All other components within normal limits  COMPREHENSIVE METABOLIC PANEL - Abnormal; Notable for the following components:   CO2 19 (*)    Glucose, Bld 191 (*)    All other components within normal limits  CBC WITH DIFFERENTIAL/PLATELET - Abnormal; Notable for the following components:   WBC 17.0 (*)    Neutro Abs 15.2 (*)    Lymphs Abs 0.6 (*)    Abs Immature Granulocytes 0.14 (*)    All other components within normal limits  RESP PANEL BY RT-PCR (FLU A&B, COVID) ARPGX2  CULTURE, BLOOD (ROUTINE X 2)  CULTURE, BLOOD (ROUTINE X 2)  URINE CULTURE  PROTIME-INR  APTT  LACTIC ACID, PLASMA  URINALYSIS, ROUTINE W REFLEX MICROSCOPIC    EKG EKG Interpretation  Date/Time:  Thursday December 13 2019 21:58:18 EST Ventricular Rate:  115 PR Interval:    QRS Duration: 136 QT Interval:  347 QTC Calculation: 480 R Axis:   102 Text Interpretation: Sinus tachycardia Atrial premature complex Right bundle branch block No old tracing to compare Confirmed by Noemi Chapel [phone]) on 12/13/2019 10:31:59 PM   Radiology CT Head Wo Contrast  Result Date: 12/13/2019 CLINICAL DATA:  got tripped and fell and hit his head EXAM: CT HEAD WITHOUT CONTRAST TECHNIQUE: Contiguous axial images were obtained from the base of the skull through the vertex without intravenous contrast. COMPARISON:  CT head 09/27/2018, CT head 07/22/2017. FINDINGS: Brain: Cerebral ventricle sizes are concordant with the degree of cerebral volume loss. Patchy and confluent areas of decreased attenuation are noted throughout the deep and periventricular white matter of the cerebral hemispheres bilaterally, compatible with chronic microvascular ischemic disease.  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 No evidence of large-territorial acute infarction. No parenchymal hemorrhage. No mass lesion. No extra-axial collection. No mass effect or midline shift. No hydrocephalus. Basilar cisterns are patent. Vascular: No hyperdense  vessel. Skull: No acute fracture or focal lesion. Sinuses/Orbits: Paranasal sinuses and mastoid air cells are clear. Bilateral lens replacement. Otherwise orbits are unremarkable. Other: None. IMPRESSION: No acute intracranial abnormality. Electronically Signed   By: Iven Finn M.D.   On: 12/13/2019 22:29   DG Chest Port 1 View  Result Date: 12/13/2019 CLINICAL DATA:  Altered mental state EXAM: PORTABLE CHEST 1 VIEW COMPARISON:  09/27/2018 FINDINGS: Surgical hardware in the cervical spine. No focal opacity or pleural effusion. Normal cardiomediastinal silhouette. No pneumothorax IMPRESSION: No active disease. Electronically Signed   By: Donavan Foil M.D.   On: 12/13/2019 22:06    Procedures Procedures (including critical care time)  Medications Ordered in ED Medications  lactated ringers infusion (has no administration in time range)  cefTRIAXone (ROCEPHIN) 2 g in sodium chloride 0.9 % 100 mL IVPB (0 g Intravenous Stopped 12/13/19 2229)  azithromycin (ZITHROMAX) 500 mg in sodium chloride 0.9 % 250 mL IVPB (500 mg Intravenous New Bag/Given 12/13/19 2229)  lactated ringers bolus 1,000 mL (1,000 mLs Intravenous New Bag/Given 12/13/19 2200)    ED Course  I have reviewed the triage vital signs and the nursing notes.  Pertinent labs & imaging results that were available during my care of the patient were reviewed by me and considered in my medical decision making (see chart for details).    MDM Rules/Calculators/A&P                          Ill-appearing 79 year old man.  Fall on Eliquis, reports hitting his head, and endorses headache at present.  Noncontrast head CT was obtained  and showed no acute intracranial abnormalities.    On arrival patient was found to be tachycardic, tachypneic, Febrile at 100.7 F.  Sepsis protocol was initiated.  Patient has leukocytosis white count at 17.0 patient's lactic acid elevated at 2.9 CMP shows glucose at 191 and CO2 at 19.  EKG showed sinus tachycardia with PAC and right bundle block.  Patient had subtle Rales in bilateral lower lung lobes however chest x-ray showed no active disease.  Urinalysis and respiratory panel are pending.    On reexamination the patient remains alert and oriented x3.  Patient was given fluid bolus, started on Rocephin and azithromycin.  Patient meets sepsis criteria for at this time source is unknown.  Will consult hospitalist for admission.  Patient is agreeable to this plan.    Patient was discussed with and evaluated by Dr. Sabra Heck.  Final Clinical Impression(s) / ED Diagnoses Final diagnoses:  Sepsis, due to unspecified organism, unspecified whether acute organ dysfunction present [facility])    Rx / DC Orders ED Discharge Orders    None       Dyann Ruddle 12/14/19 [place]    Noemi Chapel, MD 12/14/19 [phone]

## 2019-12-13 NOTE — ED Notes (Signed)
Patient resting in bed on monitor.  Handoff given to RN

## 2019-12-13 NOTE — ED Notes (Signed)
Chief Complaint  Patient presents with  . Fall    1600 fall at doctors office, waxes and wines. Lac on forehead, bleeding controlled. Eliqus for afib. A0x4 at this time.    Patient had a fall at the MDs office today, lac noted to forehead, bleeding controlled, on eliquis.  Sent home-- 1600  Wife called EMS d/t patient having intermittent confusion, and ongoing worsening HA.  Patient at this time is A0x4, tachycardiac and febrile PTA.  Sepsis protocol initiated.   A-fib on monitor. Distended abdomen noted.   Foul odor per patient during urination.

## 2019-12-13 NOTE — ED Notes (Signed)
Wife- betsy  [phone] H- preferred [phone] C

## 2019-12-13 NOTE — ED Provider Notes (Signed)
This patient is an 79 year old male, presenting with a septic appearance, febrile tachycardic tachypneic and a sat of 92 to 93% on room air.  He has subtle rales at the bases, he has been complaining of some foul-smelling urine for couple of months, no other specific complaints other than a headache.  He states he has chronic headaches has had multiple neck surgeries and is on Eliquis for atrial fibrillation.  He was at his doctor's office today where he had a fall and tripped inside the office hitting his head.  There was no loss of consciousness.  He has not had any imaging of the brain prior to this.  He arrives by paramedic transport.  They do report tachycardia and tactile temperature prehospital.  This patient is critically ill with what appears to be sepsis, consider multiple different sources including pneumonia and or urinalysis.  We will also get a Covid sample.  He will likely need to be admitted to the hospital, antibiotics will be ordered  .Critical Care Performed by: Noemi Chapel, MD Authorized by: Noemi Chapel, MD   Critical care provider statement:    Critical care time (minutes):  35   Critical care time was exclusive of:  Separately billable procedures and treating other patients and teaching time   Critical care was necessary to treat or prevent imminent or life-threatening deterioration of the following conditions:  Respiratory failure and sepsis   Critical care was time spent personally by me on the following activities:  Blood draw for specimens, development of treatment plan with patient or surrogate, discussions with consultants, evaluation of patient's response to treatment, examination of patient, obtaining history from patient or surrogate, ordering and performing treatments and interventions, ordering and review of laboratory studies, ordering and review of radiographic studies, pulse oximetry, re-evaluation of patient's condition and review of old charts   Medical  screening examination/treatment/procedure(s) were conducted as a shared visit with non-physician practitioner(s) and myself.  I personally evaluated the patient during the encounter.  Clinical Impression:   Final diagnoses:  Sepsis, due to unspecified organism, unspecified whether acute organ dysfunction present [facility])         Noemi Chapel, MD 12/14/19 [phone]

## 2019-12-14 ENCOUNTER — Encounter: Admission: RE | Admit: 2019-12-14 | Payer: Medicare Other | Source: Ambulatory Visit | Admitting: Internal Medicine

## 2019-12-14 ENCOUNTER — Inpatient Hospital Stay (HOSPITAL_COMMUNITY): Payer: Medicare Other

## 2019-12-14 DIAGNOSIS — F17200 Nicotine dependence, unspecified, uncomplicated: Secondary | ICD-10-CM | POA: Diagnosis present

## 2019-12-14 DIAGNOSIS — R509 Fever, unspecified: Secondary | ICD-10-CM

## 2019-12-14 DIAGNOSIS — S0181XA Laceration without foreign body of other part of head, initial encounter: Secondary | ICD-10-CM | POA: Diagnosis present

## 2019-12-14 DIAGNOSIS — Z20822 Contact with and (suspected) exposure to covid-19: Secondary | ICD-10-CM | POA: Diagnosis present

## 2019-12-14 DIAGNOSIS — E785 Hyperlipidemia, unspecified: Secondary | ICD-10-CM | POA: Diagnosis present

## 2019-12-14 DIAGNOSIS — W010XXA Fall on same level from slipping, tripping and stumbling without subsequent striking against object, initial encounter: Secondary | ICD-10-CM | POA: Diagnosis present

## 2019-12-14 DIAGNOSIS — W19XXXD Unspecified fall, subsequent encounter: Secondary | ICD-10-CM | POA: Diagnosis not present

## 2019-12-14 DIAGNOSIS — R6510 Systemic inflammatory response syndrome (SIRS) of non-infectious origin without acute organ dysfunction: Secondary | ICD-10-CM | POA: Diagnosis not present

## 2019-12-14 DIAGNOSIS — G4733 Obstructive sleep apnea (adult) (pediatric): Secondary | ICD-10-CM | POA: Diagnosis present

## 2019-12-14 DIAGNOSIS — R531 Weakness: Secondary | ICD-10-CM

## 2019-12-14 DIAGNOSIS — N400 Enlarged prostate without lower urinary tract symptoms: Secondary | ICD-10-CM | POA: Diagnosis present

## 2019-12-14 DIAGNOSIS — Z8673 Personal history of transient ischemic attack (TIA), and cerebral infarction without residual deficits: Secondary | ICD-10-CM | POA: Diagnosis not present

## 2019-12-14 DIAGNOSIS — G894 Chronic pain syndrome: Secondary | ICD-10-CM | POA: Diagnosis present

## 2019-12-14 DIAGNOSIS — Z888 Allergy status to other drugs, medicaments and biological substances status: Secondary | ICD-10-CM | POA: Diagnosis not present

## 2019-12-14 DIAGNOSIS — Z79899 Other long term (current) drug therapy: Secondary | ICD-10-CM | POA: Diagnosis not present

## 2019-12-14 DIAGNOSIS — D72829 Elevated white blood cell count, unspecified: Secondary | ICD-10-CM

## 2019-12-14 DIAGNOSIS — I10 Essential (primary) hypertension: Secondary | ICD-10-CM | POA: Diagnosis present

## 2019-12-14 DIAGNOSIS — I2510 Atherosclerotic heart disease of native coronary artery without angina pectoris: Secondary | ICD-10-CM | POA: Diagnosis present

## 2019-12-14 DIAGNOSIS — Z7901 Long term (current) use of anticoagulants: Secondary | ICD-10-CM | POA: Diagnosis not present

## 2019-12-14 DIAGNOSIS — R519 Headache, unspecified: Secondary | ICD-10-CM

## 2019-12-14 DIAGNOSIS — I4891 Unspecified atrial fibrillation: Secondary | ICD-10-CM | POA: Diagnosis present

## 2019-12-14 DIAGNOSIS — A419 Sepsis, unspecified organism: Secondary | ICD-10-CM | POA: Diagnosis present

## 2019-12-14 DIAGNOSIS — R040 Epistaxis: Secondary | ICD-10-CM | POA: Diagnosis present

## 2019-12-14 DIAGNOSIS — Y92009 Unspecified place in unspecified non-institutional (private) residence as the place of occurrence of the external cause: Secondary | ICD-10-CM | POA: Diagnosis not present

## 2019-12-14 DIAGNOSIS — Z6833 Body mass index (BMI) 33.0-33.9, adult: Secondary | ICD-10-CM | POA: Diagnosis not present

## 2019-12-14 DIAGNOSIS — E872 Acidosis: Secondary | ICD-10-CM | POA: Diagnosis present

## 2019-12-14 DIAGNOSIS — E7800 Pure hypercholesterolemia, unspecified: Secondary | ICD-10-CM | POA: Diagnosis present

## 2019-12-14 DIAGNOSIS — E119 Type 2 diabetes mellitus without complications: Secondary | ICD-10-CM | POA: Diagnosis present

## 2019-12-14 LAB — HEMOGLOBIN A1C
Hgb A1c MFr Bld: 6.8 % — ABNORMAL HIGH (ref 4.8–5.6)
Mean Plasma Glucose: 148.46 mg/dL

## 2019-12-14 LAB — PROTIME-INR
INR: 1.1 (ref 0.8–1.2)
Prothrombin Time: 13.9 s (ref 11.4–15.2)

## 2019-12-14 LAB — COMPREHENSIVE METABOLIC PANEL
ALT: 21 U/L (ref 0–44)
AST: 19 U/L (ref 15–41)
Albumin: 3 g/dL — ABNORMAL LOW (ref 3.5–5.0)
Alkaline Phosphatase: 81 U/L (ref 38–126)
Anion gap: 9 (ref 5–15)
BUN: 13 mg/dL (ref 8–23)
CO2: 23 mmol/L (ref 22–32)
Calcium: 8.9 mg/dL (ref 8.9–10.3)
Chloride: 105 mmol/L (ref 98–111)
Creatinine, Ser: 1.1 mg/dL (ref 0.61–1.24)
GFR, Estimated: >60 mL/min (ref >60)
Glucose, Bld: 116 mg/dL — ABNORMAL HIGH (ref 70–99)
Potassium: 4.1 mmol/L (ref 3.5–5.1)
Sodium: 137 mmol/L (ref 135–145)
Total Bilirubin: 0.5 mg/dL (ref 0.3–1.2)
Total Protein: 5.5 g/dL — ABNORMAL LOW (ref 6.5–8.1)

## 2019-12-14 LAB — D-DIMER, QUANTITATIVE: D-Dimer, Quant: 8.3 ug/mL-FEU — ABNORMAL HIGH (ref 0.00–0.50)

## 2019-12-14 LAB — LACTIC ACID, PLASMA: Lactic Acid, Venous: 2 mmol/L (ref 0.5–1.9)

## 2019-12-14 LAB — CBC
HCT: 42.6 % (ref 39.0–52.0)
Hemoglobin: 13.9 g/dL (ref 13.0–17.0)
MCH: 29 pg (ref 26.0–34.0)
MCHC: 32.6 g/dL (ref 30.0–36.0)
MCV: 88.8 fL (ref 80.0–100.0)
Platelets: 195 10*3/uL (ref 150–400)
RBC: 4.8 MIL/uL (ref 4.22–5.81)
RDW: 14.6 % (ref 11.5–15.5)
WBC: 16.4 10*3/uL — ABNORMAL HIGH (ref 4.0–10.5)
nRBC: 0 % (ref 0.0–0.2)

## 2019-12-14 LAB — URINALYSIS, ROUTINE W REFLEX MICROSCOPIC
Bilirubin Urine: NEGATIVE
Glucose, UA: NEGATIVE mg/dL
Hgb urine dipstick: NEGATIVE
Ketones, ur: NEGATIVE mg/dL
Leukocytes,Ua: NEGATIVE
Nitrite: NEGATIVE
Protein, ur: NEGATIVE mg/dL
Specific Gravity, Urine: 1.021 (ref 1.005–1.030)
pH: 5 (ref 5.0–8.0)

## 2019-12-14 LAB — GLUCOSE, CAPILLARY
Glucose-Capillary: 106 mg/dL — ABNORMAL HIGH (ref 70–99)
Glucose-Capillary: 156 mg/dL — ABNORMAL HIGH (ref 70–99)
Glucose-Capillary: 125 mg/dL — ABNORMAL HIGH (ref 70–99)
Glucose-Capillary: 119 mg/dL — ABNORMAL HIGH (ref 70–99)

## 2019-12-14 LAB — PROCALCITONIN: Procalcitonin: 2.12 ng/mL

## 2019-12-14 MED ORDER — OXYCODONE-ACETAMINOPHEN 5-325 MG PO TABS: 1.0000 | ORAL_TABLET | Freq: Four times a day (QID) | ORAL | Status: DC | PRN

## 2019-12-14 MED ORDER — IOHEXOL 350 MG/ML SOLN
100.0000 mL | Freq: Once | INTRAVENOUS | Status: AC | PRN
  Administered 2019-12-14: 100 mL via INTRAVENOUS

## 2019-12-14 MED ORDER — DULOXETINE HCL 60 MG PO CPEP
120.0000 mg | ORAL_CAPSULE | Freq: Every day | ORAL | Status: DC
  Administered 2019-12-14 – 2019-12-18 (×5): 120 mg via ORAL
  Filled 2019-12-14 (×5): qty 2

## 2019-12-14 MED ORDER — ALBUTEROL SULFATE HFA 108 (90 BASE) MCG/ACT IN AERS: 1.0000 | INHALATION_SPRAY | RESPIRATORY_TRACT | Status: DC | PRN

## 2019-12-14 MED ORDER — LACTATED RINGERS IV SOLN: INTRAVENOUS | Status: DC

## 2019-12-14 MED ORDER — ATORVASTATIN CALCIUM 80 MG PO TABS
80.0000 mg | ORAL_TABLET | Freq: Every day | ORAL | Status: DC
  Administered 2019-12-14 – 2019-12-17 (×4): 80 mg via ORAL
  Filled 2019-12-14 (×4): qty 1

## 2019-12-14 MED ORDER — TOPIRAMATE 25 MG PO TABS
50.0000 mg | ORAL_TABLET | Freq: Every day | ORAL | Status: DC
  Administered 2019-12-14: 50 mg via ORAL
  Filled 2019-12-14 (×2): qty 2

## 2019-12-14 MED ORDER — LOSARTAN POTASSIUM 25 MG PO TABS
25.0000 mg | ORAL_TABLET | Freq: Every day | ORAL | Status: DC
  Administered 2019-12-14 – 2019-12-18 (×5): 25 mg via ORAL
  Filled 2019-12-14 (×5): qty 1

## 2019-12-14 MED ORDER — TAMSULOSIN HCL 0.4 MG PO CAPS
0.4000 mg | ORAL_CAPSULE | Freq: Every day | ORAL | Status: DC
  Administered 2019-12-14 – 2019-12-17 (×4): 0.4 mg via ORAL
  Filled 2019-12-14 (×4): qty 1

## 2019-12-14 MED ORDER — SODIUM CHLORIDE 0.9 % IV SOLN
1.0000 g | INTRAVENOUS | Status: DC
  Administered 2019-12-14 – 2019-12-16 (×3): 1 g via INTRAVENOUS
  Filled 2019-12-14: qty 10
  Filled 2019-12-14 (×3): qty 1

## 2019-12-14 MED ORDER — ACETAMINOPHEN 650 MG RE SUPP: 650.0000 mg | Freq: Four times a day (QID) | RECTAL | Status: DC | PRN

## 2019-12-14 MED ORDER — ATENOLOL 25 MG PO TABS
50.0000 mg | ORAL_TABLET | Freq: Every day | ORAL | Status: DC
  Administered 2019-12-14 – 2019-12-18 (×5): 50 mg via ORAL
  Filled 2019-12-14 (×5): qty 2

## 2019-12-14 MED ORDER — AMITRIPTYLINE HCL 25 MG PO TABS
25.0000 mg | ORAL_TABLET | Freq: Every day | ORAL | Status: DC
  Administered 2019-12-14: 25 mg via ORAL
  Filled 2019-12-14: qty 1

## 2019-12-14 MED ORDER — INSULIN ASPART 100 UNIT/ML ~~LOC~~ SOLN
0.0000 [IU] | Freq: Three times a day (TID) | SUBCUTANEOUS | Status: DC
  Administered 2019-12-14: 2 [IU] via SUBCUTANEOUS
  Administered 2019-12-14 – 2019-12-17 (×3): 1 [IU] via SUBCUTANEOUS

## 2019-12-14 MED ORDER — INSULIN ASPART 100 UNIT/ML ~~LOC~~ SOLN: 0.0000 [IU] | Freq: Every day | SUBCUTANEOUS | Status: DC

## 2019-12-14 MED ORDER — ARIPIPRAZOLE 10 MG PO TABS
5.0000 mg | ORAL_TABLET | Freq: Every day | ORAL | Status: DC
  Administered 2019-12-14 – 2019-12-18 (×5): 5 mg via ORAL
  Filled 2019-12-14 (×5): qty 1

## 2019-12-14 MED ORDER — BUPROPION HCL ER (SR) 150 MG PO TB12
150.0000 mg | ORAL_TABLET | Freq: Every day | ORAL | Status: DC
  Administered 2019-12-14 – 2019-12-18 (×5): 150 mg via ORAL
  Filled 2019-12-14 (×5): qty 1

## 2019-12-14 MED ORDER — SENNOSIDES-DOCUSATE SODIUM 8.6-50 MG PO TABS: 1.0000 | ORAL_TABLET | Freq: Every evening | ORAL | Status: DC | PRN

## 2019-12-14 MED ORDER — PROMETHAZINE HCL 25 MG PO TABS: 12.5000 mg | ORAL_TABLET | Freq: Four times a day (QID) | ORAL | Status: DC | PRN

## 2019-12-14 MED ORDER — SODIUM CHLORIDE 0.9 % IV SOLN: 500.0000 mg | INTRAVENOUS | Status: DC

## 2019-12-14 MED ORDER — GABAPENTIN 300 MG PO CAPS
600.0000 mg | ORAL_CAPSULE | Freq: Three times a day (TID) | ORAL | Status: DC
  Administered 2019-12-14 – 2019-12-15 (×5): 600 mg via ORAL
  Filled 2019-12-14 (×5): qty 2

## 2019-12-14 MED ORDER — ASPIRIN EC 81 MG PO TBEC
81.0000 mg | DELAYED_RELEASE_TABLET | Freq: Every day | ORAL | Status: DC
  Administered 2019-12-14 – 2019-12-18 (×5): 81 mg via ORAL
  Filled 2019-12-14 (×5): qty 1

## 2019-12-14 MED ORDER — ACETAMINOPHEN 325 MG PO TABS: 650.0000 mg | ORAL_TABLET | Freq: Four times a day (QID) | ORAL | Status: DC | PRN

## 2019-12-14 MED ORDER — APIXABAN 5 MG PO TABS
5.0000 mg | ORAL_TABLET | Freq: Two times a day (BID) | ORAL | Status: DC
  Administered 2019-12-14 – 2019-12-18 (×9): 5 mg via ORAL
  Filled 2019-12-14 (×9): qty 1

## 2019-12-14 NOTE — ED Notes (Signed)
Pt attempted to provide a urine specimen. Unsuccessful attempt.

## 2019-12-14 NOTE — ED Notes (Signed)
Dr. Tonie Griffith notified of D-dimer of 8.3. New orders being placed for CTA.

## 2019-12-14 NOTE — Progress Notes (Signed)
Patient refused CPAP for the night  

## 2019-12-14 NOTE — ED Notes (Addendum)
Report given to [employer], Therapist, sports.

## 2019-12-14 NOTE — Evaluation (Signed)
Physical Therapy Evaluation Patient Details Name: Jailon Schaible MRN: [id number] DOB: 1940/05/21 Today's Date: 12/14/2019   History of Present Illness  Pt is an 79 y.o. male admitted 12/13/19 after fall at home hitting his head without LOC, pt also endorses generalized weakness and fever. Workup for sepsis criteria. Head CT negative. PMH includes DM, HTN, afib, stroke.    Clinical Impression  Pt presents with an overall decrease in functional mobility secondary to above. PTA, pt mod indep with intermittent use of walking stick, lives with wife at [place]. Today, pt's stability improved with use of RW for ambulation; educ pt on DME use when return home for fall risk reduction. Pt would benefit from continued acute PT services to maximize functional mobility and independence prior to d/c with HHPT services; pt unsure if he's agreeable to [facility]. Gave wife brief update over phone.  SpO2 96% on RA     Follow Up Recommendations Home health PT;Supervision for mobility/OOB (pt may decline)    Equipment Recommendations  None recommended by PT    Recommendations for Other Services       Precautions / Restrictions Precautions Precautions: Fall Restrictions Weight Bearing Restrictions: No      Mobility  Bed Mobility Overal bed mobility: Needs Assistance Bed Mobility: Supine to Sit     Supine to sit: Min assist     General bed mobility comments: MinA for HHA to elevate trunk    Transfers Overall transfer level: Needs assistance Equipment used: None;Rolling walker (2 wheeled) Transfers: Sit to/from Stand Sit to Stand: Min guard         General transfer comment: Initial standing without DME, pt with shakiness reaching for HHA to take steps to chair; additional trial with RW and min guard  Ambulation/Gait Ambulation/Gait assistance: Min guard Gait Distance (Feet): 40 Feet Assistive device: Rolling walker (2 wheeled) Gait Pattern/deviations: Step-through pattern;Decreased  stride length;Trunk flexed Gait velocity: Decreased   General Gait Details: Steps to recliner without DME, reaching to HHA for stability; ambulated additional 40' with RW, stability improved, min guard for safety; further mobility limited by fatigue  Stairs            Wheelchair Mobility    Modified Rankin (Stroke Patients Only)       Balance Overall balance assessment: Needs assistance   Sitting balance-Leahy Scale: Good       Standing balance-Leahy Scale: Fair Standing balance comment: Can static stand without UE support, stability improved with UE support                             Pertinent Vitals/Pain Pain Assessment: Faces Faces Pain Scale: Hurts little more Pain Location: Headache Pain Descriptors / Indicators: Headache Pain Intervention(s): Monitored during session;Patient requesting pain meds-RN notified    Home Living Family/patient expects to be discharged to:: Private residence [facility]) Living Arrangements: Spouse/significant other Available Help at Discharge: Family;Available 24 hours/day Type of Home: Independent living facility Home Access: Level entry     Home Layout: One level Home Equipment: Walker - 4 wheels;Other (comment);Shower seat;Grab bars - toilet;Grab bars - tub/shower (walking stick)      Prior Function Level of Independence: Independent with assistive device(s)         Comments: Mod indep with use of walking stick, [place]. Denies another fall in past 3 months besides one leading to admission     Hand Dominance  Extremity/Trunk Assessment   Upper Extremity Assessment Upper Extremity Assessment: Overall WFL for tasks assessed    Lower Extremity Assessment Lower Extremity Assessment: Generalized weakness       Communication   Communication: No difficulties  Cognition Arousal/Alertness: Awake/alert Behavior During Therapy: WFL for tasks  assessed/performed Overall Cognitive Status: Within Functional Limits for tasks assessed                                        General Comments General comments (skin integrity, edema, etc.): SpO2 98% on 2L, maintaining 96% on RA throughout session. Spoke with wife briefly on phone to discuss d/c plans, educ her and pt on recommendation to use rollator at least first few days upon return home for stability    Exercises     Assessment/Plan    PT Assessment Patient needs continued PT services  PT Problem List Decreased strength;Decreased activity tolerance;Decreased balance;Decreased mobility       PT Treatment Interventions DME instruction;Gait training;Functional mobility training;Therapeutic activities;Patient/family education;Balance training;Therapeutic exercise    PT Goals (Current goals can be found in the Care Plan section)  Acute Rehab PT Goals Patient Stated Goal: Feel better and return home PT Goal Formulation: With patient Time For Goal Achievement: 12/28/19 Potential to Achieve Goals: Good    Frequency Min 3X/week   Barriers to discharge        Co-evaluation               AM-PAC PT "6 Clicks" Mobility  Outcome Measure Help needed turning from your back to your side while in a flat bed without using bedrails?: A Little Help needed moving from lying on your back to sitting on the side of a flat bed without using bedrails?: A Little Help needed moving to and from a bed to a chair (including a wheelchair)?: A Little Help needed standing up from a chair using your arms (e.g., wheelchair or bedside chair)?: A Little Help needed to walk in hospital room?: A Little Help needed climbing 3-5 steps with a railing? : A Little 6 Click Score: 18    End of Session Equipment Utilized During Treatment: Gait belt Activity Tolerance: Patient tolerated treatment well Patient left: in chair;with call bell/phone within reach;with chair alarm set Nurse  Communication: Mobility status PT Visit Diagnosis: Other abnormalities of gait and mobility (R26.89)    Time: 6226-3335 PT Time Calculation (min) (ACUTE ONLY): 22 min   Charges:   PT Evaluation $PT Eval Moderate Complexity: [place], PT, DPT Acute Rehabilitation Services  Pager [phone] Office [place] 12/14/2019, 9:31 AM

## 2019-12-14 NOTE — H&P (Signed)
History and Physical    William Keith YQM:[id number] DOB: June 08, 1940 DOA: 12/13/2019  PCP: System, Provider Not In   Patient coming from:  Home  Chief Complaint: fall at home, generalized weakness  HPI: William Keith is a 79 y.o. male with medical history significant for history of hypertension, atrial fibrillation (on Eliquis), OSA, type 2 diabetes, hypercholesterolemia, chronic pain syndrome.  Patient presents with a chief complaint of fall.  Patient reports that he suffered a fall at around 4 pm today at home.  Patient reports that he tripped over his feet and he did hit his head but he denies any loss of consciousness.  Patient reports that he had a laceration to his forehead with bleeding controlled prior to arrival at ED. patient also reports that he had a nosebleed after his fall, states that the bleeding stopped spontaneously.  Patient states he has a headache, reports it is located in across his forehead, rates it as 8/10 on the pain scale initially but is improved now and only a 2/10.  Patient denies any neck or back pain, nausea, vomiting, lightheadedness, dizziness, or seizures.   Patient denies any recent illness or sick contacts.  He denies any fevers, chills, shortness of breath, chest pain, abdominal pain, or dysuria.  Patient does report that is "urine stinks," and has so for several months.  ED Course:  William Keith has a negative CT of head in [place]. He meets sepsis criteria and was given IVF bolus and started on empiric antibiotics. Covid-19 and Influenza swab negative.   Review of Systems:  General: Reports generalized weakness and fever. Denies weight loss, night sweats.  Denies dizziness. Reports decreased appetite. Reports fall at home HENT: Denies head trauma, headache, denies change in hearing, tinnitus.  Denies nasal congestion or bleeding.  Denies sore throat, sores in mouth.  Denies difficulty swallowing Eyes: Denies blurry vision, pain in eye, drainage.  Denies  discoloration of eyes. Neck: Denies pain.  Denies swelling.  Denies pain with movement. Cardiovascular: Denies chest pain, palpitations.  Denies edema.  Denies orthopnea Respiratory: Denies shortness of breath, cough.  Denies wheezing.  Denies sputum production Gastrointestinal: Denies abdominal pain, swelling.  Denies nausea, vomiting, diarrhea.  Denies melena.  Denies hematemesis. Musculoskeletal: Denies limitation of movement.  Denies deformity or swelling.  Denies pain.  Denies arthralgias or myalgias. Genitourinary: Denies pelvic pain.  Denies urinary frequency or hesitancy.  Denies dysuria.  Skin: Denies rash.  Denies petechiae, purpura, ecchymosis. Neurological: Reports headache.  Denies syncope.  Denies seizure activity.  Denies paresthesia.  Denies slurred speech, drooping face.  Denies visual change. Psychiatric: Denies depression, anxiety. Denies hallucinations.  Past Medical History:  Diagnosis Date  . Atrial fibrillation ([place])   . Diabetes mellitus without complication ([place])   . Dysrhythmia   . Hypercholesteremia   . Hypertension   . Neck pain   . Stroke [facility])     Past Surgical History:  Procedure Laterality Date  . NO PAST SURGERIES      Social History  reports that he has been smoking pipe. He has never used smokeless tobacco. He reports previous alcohol use. No history on file for drug use.  Allergies  Allergen Reactions  . Dilaudid [Hydromorphone Hcl] Other (See Comments)    hallucination    History reviewed. No pertinent family history.   Prior to Admission medications   Medication Sig Start Date End Date Taking? Authorizing Provider  acetaminophen (TYLENOL) 325 MG tablet Take 650 mg by mouth every 6 (six) hours  as needed for mild pain, fever or headache.   Yes [provider]  amitriptyline (ELAVIL) 25 MG tablet Take 25 mg by mouth at bedtime. 09/21/19 09/20/20 Yes [provider]  ARIPiprazole (ABILIFY) 5 MG tablet Take 1 tablet by mouth  daily. 12/02/19  Yes [provider]  aspirin EC 81 MG tablet Take 81 mg by mouth daily.   Yes [provider]  atorvastatin (LIPITOR) 80 MG tablet Take 80 mg by mouth daily. 08/01/18  Yes [provider]  buPROPion (WELLBUTRIN SR) 150 MG 12 hr tablet Take 150 mg by mouth daily. 07/08/18  Yes [provider]  Cholecalciferol (D2000 ULTRA STRENGTH) 50 MCG (2000 UT) CAPS Take 2,000 Units by mouth daily.   Yes [provider]  diclofenac Sodium (VOLTAREN) 1 % GEL Apply 2-4 g topically 4 (four) times daily as needed. 02/05/19 02/05/20 Yes [provider]  DULoxetine (CYMBALTA) 60 MG capsule Take 2 capsules by mouth daily. 10/31/19  Yes [provider]  fluticasone (FLONASE) 50 MCG/ACT nasal spray Place 2 sprays into the nose 2 (two) times daily as needed for rhinitis. 04/13/18 12/13/19 Yes [provider]  gabapentin (NEURONTIN) 300 MG capsule Take 600 mg by mouth 3 (three) times daily. 09/09/18  Yes [provider]  losartan (COZAAR) 50 MG tablet Take 25 mg by mouth daily. 08/13/18  Yes [provider]  Multiple Vitamins-Minerals (MULTIVITAMIN WITH MINERALS) tablet Take 1 tablet by mouth at bedtime.   Yes [provider]  nitroGLYCERIN (NITROLINGUAL) 0.4 MG/SPRAY spray Place 1 spray under the tongue every 5 (five) minutes x 3 doses as needed for chest pain.   Yes [provider]  omeprazole (PRILOSEC) 40 MG capsule Take 40 mg by mouth daily. 09/06/18  Yes [provider]  oxyCODONE-acetaminophen (PERCOCET/ROXICET) 5-325 MG tablet Take 1 tablet by mouth every 6 (six) hours as needed for moderate pain.   Yes [provider]  tamsulosin (FLOMAX) 0.4 MG CAPS capsule Take 0.4 mg by mouth at bedtime.   Yes [provider]  topiramate (TOPAMAX) 50 MG tablet Take 50 mg by mouth at bedtime. 06/18/18  Yes [provider]  albuterol (VENTOLIN HFA) 108 (90 Base) MCG/ACT inhaler Inhale 1  puff into the lungs every 4 (four) hours as needed for wheezing or shortness of breath.    [provider]  atenolol (TENORMIN) 50 MG tablet Take 50 mg by mouth daily. 11/06/19   [provider]  cyclobenzaprine (FLEXERIL) 10 MG tablet Take 10 mg by mouth 3 (three) times daily as needed for muscle spasms.    [provider]  ELIQUIS 5 MG TABS tablet Take 5 mg by mouth 2 (two) times daily. 07/11/18   [provider]  meclizine (ANTIVERT) 25 MG tablet Take 25 mg by mouth 2 (two) times daily as needed for dizziness.    [provider]  ramipril (ALTACE) 10 MG capsule Take 10 mg by mouth. Patient not taking: No sig reported    [provider]    Physical Exam: Vitals:   12/13/19 2230 12/13/19 2300 12/13/19 2305 12/13/19 2330  BP: 132/73 (!) 114/51  123/64  Pulse: (!) 113 (!) 125  (!) 120  Resp: (!) 31 (!) 30  (!) 27  Temp:   99.8 F (37.7 C)   TempSrc:   Oral   SpO2: 95% 94%  96%  Weight:      Height:        Constitutional: NAD, calm, comfortable Vitals:  12/13/19 2230 12/13/19 2300 12/13/19 2305 12/13/19 2330  BP: 132/73 (!) 114/51  123/64  Pulse: (!) 113 (!) 125  (!) 120  Resp: (!) 31 (!) 30  (!) 27  Temp:   99.8 F (37.7 C)   TempSrc:   Oral   SpO2: 95% 94%  96%  Weight:      Height:       General: WDWN, Alert and oriented x3.  Eyes: EOMI, PERRL,  conjunctivae normal.  Sclera nonicteric HENT:  [place]/AT, external ears normal.  Nares patent without epistasis.  Mucous membranes are moist. Posterior pharynx clear of any exudate or lesions.  Neck: Soft, normal range of motion, supple, no masses, no thyromegaly.  Trachea midline Respiratory: clear to auscultation bilaterally, no wheezing, no crackles. Normal respiratory effort. No accessory muscle use.  Cardiovascular: sinus tachycardia, no murmurs / rubs / gallops. No extremity edema. 1+ pedal pulses.  Abdomen: Soft, no tenderness, nondistended, Obese. no rebound or guarding.  No  masses palpated. Bowel sounds normoactive Musculoskeletal: FROM. has clubbing, no cyanosis. No joint deformity upper and lower extremities. no contractures. Normal muscle tone.  Skin: Warm, dry, intact no rashes, lesions, ulcers. No induration Neurologic: CN 2-12 grossly intact. Normal speech. Sensation intact, patella DTR +1 bilaterally. Strength 5/5 in all extremities.   Psychiatric: Normal judgment and insight.  Normal mood.    Labs on Admission: I have personally reviewed following labs and imaging studies  CBC: Recent Labs  Lab 12/13/19 2150  WBC 17.0*  NEUTROABS 15.2*  HGB 16.0  HCT 50.5  MCV 90.0  PLT 295    Basic Metabolic Panel: Recent Labs  Lab 12/13/19 2150  NA 135  K 4.2  CL 103  CO2 19*  GLUCOSE 191*  BUN 16  CREATININE 1.08  CALCIUM 9.5    GFR: Estimated Creatinine Clearance: 62.9 mL/min (by C-G formula based on SCr of 1.08 mg/dL).  Liver Function Tests: Recent Labs  Lab 12/13/19 2150  AST 26  ALT 25  ALKPHOS 106  BILITOT 0.8  PROT 6.5  ALBUMIN 3.7    Urine analysis:    Component Value Date/Time   COLORURINE YELLOW (A) 09/27/2018 2247   APPEARANCEUR CLEAR (A) 09/27/2018 2247   LABSPEC 1.021 09/27/2018 2247   PHURINE 5.0 09/27/2018 2247   GLUCOSEU NEGATIVE 09/27/2018 2247   HGBUR SMALL (A) 09/27/2018 2247   [place] NEGATIVE 09/27/2018 2247   [place] 09/27/2018 2247   PROTEINUR 30 (A) 09/27/2018 2247   NITRITE NEGATIVE 09/27/2018 2247   LEUKOCYTESUR NEGATIVE 09/27/2018 2247    Radiological Exams on Admission: CT Head Wo Contrast  Result Date: 12/13/2019 CLINICAL DATA:  got tripped and fell and hit his head EXAM: CT HEAD WITHOUT CONTRAST TECHNIQUE: Contiguous axial images were obtained from the base of the skull through the vertex without intravenous contrast. COMPARISON:  CT head 09/27/2018, CT head 07/22/2017. FINDINGS: Brain: Cerebral ventricle sizes are concordant with the degree of cerebral volume loss. Patchy and  confluent areas of decreased attenuation are noted throughout the deep and periventricular white matter of the cerebral hemispheres bilaterally, compatible with chronic microvascular ischemic disease. 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 No evidence of large-territorial acute infarction. No parenchymal hemorrhage. No mass lesion. No extra-axial collection. No mass effect or midline shift. No hydrocephalus. Basilar cisterns are patent. Vascular: No hyperdense vessel. Skull: No acute fracture or focal lesion. Sinuses/Orbits: Paranasal sinuses and mastoid air cells are clear. Bilateral lens replacement. Otherwise orbits are unremarkable. Other: None.  IMPRESSION: No acute intracranial abnormality. Electronically Signed   By: Iven Finn M.D.   On: 12/13/2019 22:29   DG Chest Port 1 View  Result Date: 12/13/2019 CLINICAL DATA:  Altered mental state EXAM: PORTABLE CHEST 1 VIEW COMPARISON:  09/27/2018 FINDINGS: Surgical hardware in the cervical spine. No focal opacity or pleural effusion. Normal cardiomediastinal silhouette. No pneumothorax IMPRESSION: No active disease. Electronically Signed   By: Donavan Foil M.D.   On: 12/13/2019 22:06    EKG: Independently reviewed.  EKG shows sinus tachycardia with occasional PAC.  Right bundle branch block present.  Nonspecific ST changes.  QTc 480  Assessment/Plan Principal Problem:   Sepsis [facility]) William Keith is admitted to [place] floor with sepsis.  Patient meets sepsis criteria by fever, leukocytosis, tachycardia and tachypnea with suspected infection.  Chest x-ray is negative for infiltrate but patient may be dehydrated and will repeat x-ray after IV fluid hydration.  Also ordered a D-dimer and if this is elevated will obtain CT angiography of the chest which would show pneumonia better than chest x-ray.  Urinalysis is currently pending.  Cultures have been obtained in the emergency room will be  monitored.  Active Problems:   Generalized weakness Pt had a fall at home today. Placed on fall precautions and ambulate with assistance.  Consult PT for evaluation in am. If needs are identified such as rehab or home health will have discharge planner get  involved to assist with setting up.     Leukocytosis WBC elevated. Will recheck CBC in am.     Fever Tylenol as needed.     Headache Tylenol as needed. Percocet for breakthru pain.   DVT prophylaxis: Patient is on Eliquis for anticoagulation which will be continued Code Status:   Full code Family Communication:  Diagnosis and plan discussed with patient.  Further recommendations to follow as clinically indicated Disposition Plan:   Patient is from:  Home  Anticipated DC to:  Home  Anticipated DC date:  Anticipate greater than 2 midnight stay to treat acute medical condition  Anticipated DC barriers: No barriers to discharge identified at this time  Admission status:  Inpatient   Yevonne Aline Celia Gibbons MD [facility]  How to contact the [facility] Attending or Consulting provider [place] or covering provider during after hours [place] [facility], for this patient?   1. Check the care team in [facility] and look for a) attending/consulting [facility] provider listed and b) the [facility] team listed 2. Log into [email or web address] and use 's universal password to access. If you do not have the password, please contact the hospital operator. 3. Locate the [facility] provider you are looking for under [facility] and page to a number that you can be directly reached. 4. If you still have difficulty reaching the provider, please page the [facility] (Director on Call) for the Hospitalists listed on amion for assistance.  12/14/2019, 12:53 AM

## 2019-12-14 NOTE — Progress Notes (Signed)
PROGRESS NOTE  William Keith  DOB: 09/29/1940  PCP: System, Provider Not In [id number]:[id number]  DOA: 12/13/2019  LOS: 0 days   Chief Complaint  Patient presents with  . Fall    1600 fall at doctors office, waxes and wines. Lac on forehead, bleeding controlled. Eliqus for afib. A0x4 at this time.    Brief narrative: William Keith is a 79 y.o. male with PMH significant for DM2, HTN, HLD, A. fib on Eliquis, stroke, OSA, chronic pain syndrome. Patient presented to the ED on 12/9 from home after a fall from tripping over his feet. He sustained a laceration on his forehead, had nosebleed. Bleeding had stopped by the time of presentation to the ED but he complained of headache 8/10 in intensity. Patient also reported that he has foul-smelling urine for last several months.    In the ED, he was noted to have a fever of 100.7, tachycardic to 122, tachypneic to 33 to maintain O2 sat over 90%, blood pressure 145/63.  Initial labs with WBC count elevated to 17, lactic acid elevated to 2.9, procalcitonin elevated to 2.12 D-dimer elevated to 8.3. Urinalysis unremarkable CT head did not show any intracranial abnormality. CT angio chest did not show any pulmonary embolism or any intrathoracic process.   Patient was admitted to hospitalist service for further evaluation management  Subjective: Patient was seen and examined this morning. Obese Caucasian male. Somnolent. Wakes up on verbal command. Says he is trying to catch up last night sleep. Able to answer orientation questions. Not in distress. Overnight he continued to have fever up to 100.6, tachycardic most of the night up to 110.  On 2 L oxygen. Labs from this morning with hemoglobin A1c 6.8, WBC count 16.4  Assessment/Plan: SIRS -Primary complaint of fall but also has fever, tachycardia, tachypnea, leukocytosis, lactic acidosis and elevated procalcitonin -Unable to call sepsis yet because no clear evidence of infection with normal chest  x-ray, normal urinalysis.   -Blood culture sent.  Empirically started on IV Rocephin and IV azithromycin in the ED.  Continue the same for now. Recent Labs  Lab 12/13/19 2150 12/13/19 2346 12/14/19 0428  WBC 17.0*  --  16.4*  LATICACIDVEN 2.9* 2.0*  --   PROCALCITON  --   --  2.12   Fall Extracranial injury -Sustained laceration of forehead and nosebleed with fall.  Bleeding stopped at the time of presentation. -CT head did not show any intracranial injury. -PT OT eval.  Diabetes mellitus -Diet controlled, A1c 6.8 on 12/10.  Essential hypertension -Home meds include Atenolol 50 mg daily, losartan 25 mg daily -Continue the same.  Continue to monitor blood pressure.  A. Fib History of stroke, hyperlipidemia -Aspirin 81 mg daily, Lipitor 80 mg daily, Eliquis 5 mg twice daily  BPH -Flomax.  OSA -Uses nightly CPAP. Continue the same.  Chronic pain syndrome -Review of home meds [place] patient is on multiple mood altering medications at home including Flexeril 10 mg 3 times daily as needed, Meclizine 25 mg twice daily as needed, amitriptyline 25 mg at bedtime, Abilify 5 mg daily, bupropion 150 mg daily, Cymbalta 120 mg daily, Neurontin 600 mg 3 times Percocet 5 mg 4 times daily as needed, daily, Topamax 50 mg at bedtime. -He is somnolent this morning. Claims to be catching sleep deficit from last night. Patient is at risk of polypharmacy, counseled to minimize the use of multiple mood altering medications.  Mobility: Encourage ambulation. Pending PT eval Code Status:   Code Status: Full Code  Nutritional status: Body mass index is 33.91 kg/m.     Diet Order            Diet heart healthy/carb modified Room service appropriate? Yes; Fluid consistency: Thin  Diet effective now                 DVT prophylaxis:  apixaban (ELIQUIS) tablet 5 mg   Antimicrobials:  IV Rocephin and IV azithromycin Fluid: Continue LR at a reduced rate of 75 mill per hour.  Consultants:  None Family Communication:  None at bedside  Status is: Inpatient  Remains inpatient appropriate because: -Requires continued sepsis work-up.  Dispo: The patient is from: Home              Anticipated d/c is to: Home versus SNF. Pending PT eval              Anticipated d/c date is: 2 to 3 days              Patient currently is not medically stable to d/c.       Infusions:  . azithromycin Stopped (12/13/19 2329)  . cefTRIAXone (ROCEPHIN)  IV    . lactated ringers 150 mL/hr at 12/14/19 0200  . lactated ringers 125 mL/hr at 12/14/19 1011    Scheduled Meds: . amitriptyline  25 mg Oral QHS  . apixaban  5 mg Oral BID  . ARIPiprazole  5 mg Oral Daily  . aspirin EC  81 mg Oral Daily  . atenolol  50 mg Oral Daily  . atorvastatin  80 mg Oral q1800  . buPROPion  150 mg Oral Daily  . DULoxetine  120 mg Oral Daily  . gabapentin  600 mg Oral TID  . insulin aspart  0-5 Units Subcutaneous QHS  . insulin aspart  0-9 Units Subcutaneous TID WC  . losartan  25 mg Oral Daily  . tamsulosin  0.4 mg Oral QHS  . topiramate  50 mg Oral QHS    Antimicrobials: Anti-infectives (From admission, onward)   Start     Dose/Rate Route Frequency Ordered Stop   12/14/19 2200  cefTRIAXone (ROCEPHIN) 1 g in sodium chloride 0.9 % 100 mL IVPB        1 g 200 mL/hr over 30 Minutes Intravenous Every 24 hours 12/14/19 0401     12/14/19 0500  azithromycin (ZITHROMAX) 500 mg in sodium chloride 0.9 % 250 mL IVPB  Status:  Discontinued        500 mg 250 mL/hr over 60 Minutes Intravenous Every 24 hours 12/14/19 0401 12/14/19 0405   12/13/19 2200  cefTRIAXone (ROCEPHIN) 2 g in sodium chloride 0.9 % 100 mL IVPB  Status:  Discontinued        2 g 200 mL/hr over 30 Minutes Intravenous Every 24 hours 12/13/19 2151 12/14/19 0405   12/13/19 2200  azithromycin (ZITHROMAX) 500 mg in sodium chloride 0.9 % 250 mL IVPB        500 mg 250 mL/hr over 60 Minutes Intravenous Every 24 hours 12/13/19 2151        PRN  meds: acetaminophen **OR** acetaminophen, albuterol, oxyCODONE-acetaminophen, promethazine, senna-docusate   Objective: Vitals:   12/14/19 0333 12/14/19 0352  BP:  126/75  Pulse:  (!) 102  Resp:  18  Temp: (!) 100.5 F (38.1 C) 98.1 F (36.7 C)  SpO2:  100%    Intake/Output Summary (Last 24 hours) at 12/14/2019 1329 Last data filed at 12/14/2019 0900 Gross per 24 hour  Intake 1112.5  ml  Output 400 ml  Net 712.5 ml   Filed Weights   12/13/19 2122  Weight: 101.2 kg   Weight change:  Body mass index is 33.91 kg/m.   Physical Exam: General exam: Pleasant, elderly Caucasian male. Not in physical distress Skin: No rashes, lesions or ulcers. HEENT: Atraumatic, normocephalic, no obvious bleeding Lungs: Clear to auscultation bilaterally CVS: Regular rate and rhythm, no murmur GI/Abd soft, nontender, nondistended, bowel sound present CNS: Somnolent. Opens eyes on verbal command. Answers orientation questions appropriately. Psychiatry: Mood appropriate Extremities: No edema, no calf tenderness  Data Review: I have personally reviewed the laboratory data and studies available.  Recent Labs  Lab 12/13/19 2150 12/14/19 0428  WBC 17.0* 16.4*  NEUTROABS 15.2*  --   HGB 16.0 13.9  HCT 50.5 42.6  MCV 90.0 88.8  PLT 213 195   Recent Labs  Lab 12/13/19 2150 12/14/19 0428  NA 135 137  K 4.2 4.1  CL 103 105  CO2 19* 23  GLUCOSE 191* 116*  BUN 16 13  CREATININE 1.08 1.10  CALCIUM 9.5 8.9    F/u labs ordered.  Signed, Terrilee Croak, MD [facility] 12/14/2019

## 2019-12-14 NOTE — Plan of Care (Signed)
  Problem: Education: Goal: Knowledge of General Education information will improve Description Including pain rating scale, medication(s)/side effects and non-pharmacologic comfort measures Outcome: Progressing   

## 2019-12-14 NOTE — Progress Notes (Signed)
Patient admitted to 6N31 from ED. Alert and oriented x4. Denies c/o pain at this time. Vital signs within normal limits. Oriented to room and call bell.

## 2019-12-15 LAB — CBC WITH DIFFERENTIAL/PLATELET
Abs Immature Granulocytes: 0.07 10*3/uL (ref 0.00–0.07)
Basophils Absolute: 0.1 10*3/uL (ref 0.0–0.1)
Basophils Relative: 1 %
Eosinophils Absolute: 0.4 10*3/uL (ref 0.0–0.5)
Eosinophils Relative: 3 %
HCT: 41.5 % (ref 39.0–52.0)
Hemoglobin: 13.5 g/dL (ref 13.0–17.0)
Immature Granulocytes: 1 %
Lymphocytes Relative: 14 %
Lymphs Abs: 1.9 10*3/uL (ref 0.7–4.0)
MCH: 28.8 pg (ref 26.0–34.0)
MCHC: 32.5 g/dL (ref 30.0–36.0)
MCV: 88.5 fL (ref 80.0–100.0)
Monocytes Absolute: 1.6 10*3/uL — ABNORMAL HIGH (ref 0.1–1.0)
Monocytes Relative: 12 %
Neutro Abs: 9.4 10*3/uL — ABNORMAL HIGH (ref 1.7–7.7)
Neutrophils Relative %: 69 %
Platelets: 185 10*3/uL (ref 150–400)
RBC: 4.69 MIL/uL (ref 4.22–5.81)
RDW: 14.7 % (ref 11.5–15.5)
WBC: 13.4 10*3/uL — ABNORMAL HIGH (ref 4.0–10.5)
nRBC: 0 % (ref 0.0–0.2)

## 2019-12-15 LAB — PHOSPHORUS: Phosphorus: 2 mg/dL — ABNORMAL LOW (ref 2.5–4.6)

## 2019-12-15 LAB — BASIC METABOLIC PANEL
Anion gap: 8 (ref 5–15)
BUN: 8 mg/dL (ref 8–23)
CO2: 23 mmol/L (ref 22–32)
Calcium: 9 mg/dL (ref 8.9–10.3)
Chloride: 103 mmol/L (ref 98–111)
Creatinine, Ser: 0.96 mg/dL (ref 0.61–1.24)
GFR, Estimated: >60 mL/min (ref >60)
Glucose, Bld: 120 mg/dL — ABNORMAL HIGH (ref 70–99)
Potassium: 4 mmol/L (ref 3.5–5.1)
Sodium: 134 mmol/L — ABNORMAL LOW (ref 135–145)

## 2019-12-15 LAB — GLUCOSE, CAPILLARY
Glucose-Capillary: 101 mg/dL — ABNORMAL HIGH (ref 70–99)
Glucose-Capillary: 144 mg/dL — ABNORMAL HIGH (ref 70–99)
Glucose-Capillary: 115 mg/dL — ABNORMAL HIGH (ref 70–99)
Glucose-Capillary: 101 mg/dL — ABNORMAL HIGH (ref 70–99)

## 2019-12-15 LAB — LACTIC ACID, PLASMA: Lactic Acid, Venous: 0.9 mmol/L (ref 0.5–1.9)

## 2019-12-15 LAB — MAGNESIUM: Magnesium: 1.8 mg/dL (ref 1.7–2.4)

## 2019-12-15 MED ORDER — POTASSIUM & SODIUM PHOSPHATES 280-160-250 MG PO PACK
1.0000 | PACK | Freq: Three times a day (TID) | ORAL | Status: AC
  Administered 2019-12-15 – 2019-12-16 (×3): 1 via ORAL
  Filled 2019-12-15 (×3): qty 1

## 2019-12-15 NOTE — Progress Notes (Signed)
Patient refused CPAP for the night  

## 2019-12-15 NOTE — Progress Notes (Signed)
PROGRESS NOTE  William Keith  DOB: 01/29/1940  PCP: System, Provider Not In [id number]  DOA: 12/13/2019  LOS: 1 day   Chief Complaint  Patient presents with  . Fall    1600 fall at doctors office, waxes and wines. Lac on forehead, bleeding controlled. Eliqus for afib. A0x4 at this time.    Brief narrative: William Keith is a 79 y.o. male with PMH significant for DM2, HTN, HLD, A. fib on Eliquis, stroke, OSA, chronic pain syndrome presented to the ED on 12/9 s/p mechanical fall.  States "I tripped by accident and fell."  Had small lac on forehead.  Head CT was normal.  Subjective: Patient was seen and examined this morning. Patient states he is doing well. Asks when he can go home. Denies fevers, chills, headache, weakness, chest pain, shortness of breath, nausea, vomiting, diarrhea, or abdominal pain.  Assessment/Plan:  Fevers , resolved - On antibiotics. - Follow up on cultures. - UA is clean.  CTA showed no pneumonia.  If cultures are negative, discontinue antibiotics and discharge.  S/P Fall - mechanical vs polypharmacy - Hold sedating meds. -PT OT eval.  Diabetes mellitus -Sliding Scale.  Essential hypertension -Home meds include Atenolol 50 mg daily, losartan 25 mg daily  A. Fib - Eliquis and Statin  CAD - Aspirin and Statin.  BPH -Flomax.  OSA -Uses nightly CPAP. Continue the same.  Chronic pain syndrome -Review of home meds Minodyl patient is on multiple mood altering medications at home including Flexeril 10 mg 3 times daily as needed, Meclizine 25 mg twice daily as needed, amitriptyline 25 mg at bedtime, Abilify 5 mg daily, bupropion 150 mg daily, Cymbalta 120 mg daily, Neurontin 600 mg 3 times Percocet 5 mg 4 times daily as needed, daily, Topamax 50 mg at bedtime. - Discontinue these meds on discharge to avoid polypharmacy contributing to recurrent falls.  Mobility: Encourage ambulation. Pending PT eval Code Status:   Code Status: Full Code   Nutritional status: Body mass index is 33.91 kg/m.     Diet Order            Diet heart healthy/carb modified Room service appropriate? Yes; Fluid consistency: Thin  Diet effective now                 DVT prophylaxis:  apixaban (ELIQUIS) tablet 5 mg   Antimicrobials:  IV Rocephin and IV azithromycin Fluid: Stop fluids. Consultants: None Family Communication:  None at bedside  Status is: Inpatient  Remains inpatient appropriate because: -Requires continued sepsis work-up.  Dispo: The patient is from: Home              Anticipated d/c is to: Home vs HH.  Pending PT eval              Anticipated d/c date is: 2 days              Patient currently is not medically stable to d/c.       Infusions:  . azithromycin Stopped (12/14/19 2207)  . cefTRIAXone (ROCEPHIN)  IV Stopped (12/14/19 2140)  . lactated ringers 75 mL/hr at 12/15/19 0946    Scheduled Meds: . amitriptyline  25 mg Oral QHS  . apixaban  5 mg Oral BID  . ARIPiprazole  5 mg Oral Daily  . aspirin EC  81 mg Oral Daily  . atenolol  50 mg Oral Daily  . atorvastatin  80 mg Oral q1800  . buPROPion  150 mg Oral Daily  .  DULoxetine  120 mg Oral Daily  . gabapentin  600 mg Oral TID  . insulin aspart  0-5 Units Subcutaneous QHS  . insulin aspart  0-9 Units Subcutaneous TID WC  . losartan  25 mg Oral Daily  . potassium & sodium phosphates  1 packet Oral Q8H  . tamsulosin  0.4 mg Oral QHS  . topiramate  50 mg Oral QHS    Antimicrobials: Anti-infectives (From admission, onward)   Start     Dose/Rate Route Frequency Ordered Stop   12/14/19 2200  cefTRIAXone (ROCEPHIN) 1 g in sodium chloride 0.9 % 100 mL IVPB        1 g 200 mL/hr over 30 Minutes Intravenous Every 24 hours 12/14/19 0401     12/14/19 0500  azithromycin (ZITHROMAX) 500 mg in sodium chloride 0.9 % 250 mL IVPB  Status:  Discontinued        500 mg 250 mL/hr over 60 Minutes Intravenous Every 24 hours 12/14/19 0401 12/14/19 0405   12/13/19 2200   cefTRIAXone (ROCEPHIN) 2 g in sodium chloride 0.9 % 100 mL IVPB  Status:  Discontinued        2 g 200 mL/hr over 30 Minutes Intravenous Every 24 hours 12/13/19 2151 12/14/19 0405   12/13/19 2200  azithromycin (ZITHROMAX) 500 mg in sodium chloride 0.9 % 250 mL IVPB        500 mg 250 mL/hr over 60 Minutes Intravenous Every 24 hours 12/13/19 2151        PRN meds: acetaminophen **OR** acetaminophen, albuterol, oxyCODONE-acetaminophen, promethazine, senna-docusate   Objective: Vitals:   12/15/19 0419 12/15/19 1436  BP: 126/72 (!) 142/75  Pulse: 93 92  Resp: 18 20  Temp: 98.1 F (36.7 C) 98 F (36.7 C)  SpO2: 100% 98%    Intake/Output Summary (Last 24 hours) at 12/15/2019 1910 Last data filed at 12/15/2019 1729 Gross per 24 hour  Intake 3324.69 ml  Output 1530 ml  Net 1794.69 ml   Filed Weights   12/13/19 2122  Weight: 101.2 kg   Weight change:  Body mass index is 33.91 kg/m.   Physical Exam: General exam: Pleasant, elderly Caucasian male. Not in physical distress Skin: No rashes, lesions or ulcers. HEENT: Atraumatic, normocephalic, no obvious bleeding Lungs: Clear to auscultation bilaterally CVS: Regular rate and rhythm, no murmur GI/Abd soft, nontender, nondistended, bowel sound present CNS: Somnolent. Opens eyes on verbal command. Answers orientation questions appropriately. Psychiatry: Mood appropriate Extremities: No edema, no calf tenderness  Data Review: I have personally reviewed the laboratory data and studies available.  Recent Labs  Lab 12/13/19 2150 12/14/19 0428 12/15/19 0328  WBC 17.0* 16.4* 13.4*  NEUTROABS 15.2*  --  9.4*  HGB 16.0 13.9 13.5  HCT 50.5 42.6 41.5  MCV 90.0 88.8 88.5  PLT 213 195 185   Recent Labs  Lab 12/13/19 2150 12/14/19 0428 12/15/19 0328  NA 135 137 134*  K 4.2 4.1 4.0  CL 103 105 103  CO2 19* 23 23  GLUCOSE 191* 116* 120*  BUN 16 13 8   CREATININE 1.08 1.10 0.96  CALCIUM 9.5 8.9 9.0  MG  --   --  1.8  PHOS  --    --  2.0*    F/u labs ordered.  Signed, George Hugh, MD [facility] 12/15/2019

## 2019-12-16 DIAGNOSIS — R6510 Systemic inflammatory response syndrome (SIRS) of non-infectious origin without acute organ dysfunction: Secondary | ICD-10-CM

## 2019-12-16 DIAGNOSIS — W19XXXD Unspecified fall, subsequent encounter: Secondary | ICD-10-CM

## 2019-12-16 DIAGNOSIS — R531 Weakness: Secondary | ICD-10-CM

## 2019-12-16 LAB — CBC WITH DIFFERENTIAL/PLATELET
Abs Immature Granulocytes: 0.06 10*3/uL (ref 0.00–0.07)
Basophils Absolute: 0.1 10*3/uL (ref 0.0–0.1)
Basophils Relative: 0 %
Eosinophils Absolute: 0.4 10*3/uL (ref 0.0–0.5)
Eosinophils Relative: 3 %
HCT: 38.4 % — ABNORMAL LOW (ref 39.0–52.0)
Hemoglobin: 13 g/dL (ref 13.0–17.0)
Immature Granulocytes: 1 %
Lymphocytes Relative: 13 %
Lymphs Abs: 1.6 10*3/uL (ref 0.7–4.0)
MCH: 29.1 pg (ref 26.0–34.0)
MCHC: 33.9 g/dL (ref 30.0–36.0)
MCV: 85.9 fL (ref 80.0–100.0)
Monocytes Absolute: 1.5 10*3/uL — ABNORMAL HIGH (ref 0.1–1.0)
Monocytes Relative: 12 %
Neutro Abs: 8.5 10*3/uL — ABNORMAL HIGH (ref 1.7–7.7)
Neutrophils Relative %: 71 %
Platelets: 185 10*3/uL (ref 150–400)
RBC: 4.47 MIL/uL (ref 4.22–5.81)
RDW: 14.3 % (ref 11.5–15.5)
WBC: 12.1 10*3/uL — ABNORMAL HIGH (ref 4.0–10.5)
nRBC: 0 % (ref 0.0–0.2)

## 2019-12-16 LAB — PROCALCITONIN: Procalcitonin: 0.39 ng/mL

## 2019-12-16 LAB — BLOOD GAS, ARTERIAL
Acid-base deficit: 3.6 mmol/L — ABNORMAL HIGH (ref 0.0–2.0)
Bicarbonate: 19.9 mmol/L — ABNORMAL LOW (ref 20.0–28.0)
Drawn by: 54887
FIO2: 21
O2 Saturation: 95.8 %
Patient temperature: 37
pCO2 arterial: 30.1 mmHg — ABNORMAL LOW (ref 32.0–48.0)
pH, Arterial: 7.437 (ref 7.350–7.450)
pO2, Arterial: 77 mmHg — ABNORMAL LOW (ref 83.0–108.0)

## 2019-12-16 LAB — BASIC METABOLIC PANEL
Anion gap: 9 (ref 5–15)
BUN: 9 mg/dL (ref 8–23)
CO2: 18 mmol/L — ABNORMAL LOW (ref 22–32)
Calcium: 8.7 mg/dL — ABNORMAL LOW (ref 8.9–10.3)
Chloride: 104 mmol/L (ref 98–111)
Creatinine, Ser: 0.79 mg/dL (ref 0.61–1.24)
GFR, Estimated: >60 mL/min (ref >60)
Glucose, Bld: 122 mg/dL — ABNORMAL HIGH (ref 70–99)
Potassium: 4.1 mmol/L (ref 3.5–5.1)
Sodium: 131 mmol/L — ABNORMAL LOW (ref 135–145)

## 2019-12-16 LAB — GLUCOSE, CAPILLARY
Glucose-Capillary: 99 mg/dL (ref 70–99)
Glucose-Capillary: 114 mg/dL — ABNORMAL HIGH (ref 70–99)
Glucose-Capillary: 80 mg/dL (ref 70–99)
Glucose-Capillary: 138 mg/dL — ABNORMAL HIGH (ref 70–99)

## 2019-12-16 NOTE — Progress Notes (Signed)
Patient refused home CPAP for the night.

## 2019-12-16 NOTE — Progress Notes (Signed)
PROGRESS NOTE  William Keith  DOB: 1940-11-26  PCP: System, Provider Not In [id number]  DOA: 12/13/2019  LOS: 2 days   Chief Complaint  Patient presents with   Fall    1600 fall at doctors office, waxes and wines. Lac on forehead, bleeding controlled. Eliqus for afib. A0x4 at this time.    Brief narrative: Patient is an 79 year old male, morbidly obese, with PMH significant for DM2, HTN, HLD, A. fib on Eliquis, stroke, OSA, chronic pain syndrome admitted following a mechanical fall.  According to the patient, he tripped and fell.  Patient was on multiple mind altering medications prior to admission.  Patient had small lac on forehead.  Head CT was normal.  There were initial concerns for possible SIRS.  Work-up for infective process has been nonrevealing.  We discontinue patient's antibiotics after reviewing repeat procalcitonin level.  CO2 of 18 is noted, patient has been on as needed acetaminophen, and no ABG visualized.  Will check ABG.  Lactic acid was initially elevated, but down to normal.  Last procalcitonin level was 2.12 (on 12/14/2019).  We will repeat procalcitonin level.  Subjective: Patient seen. No fever chills. No shortness of breath. No chest pain. Pain is controlled.  Physical therapy input is appreciated.  Will consult case management to assist with discharge needs.  Assessment/Plan:  Fevers , resolved - On antibiotics. - Follow up on cultures. - UA is clean.  CTA showed no pneumonia.  If cultures are negative, discontinue antibiotics and discharge. 12/16/2019: We will repeat procalcitonin level.  Likely discontinue antibiotics if procalcitonin level falls to within normal range.  S/P Fall - mechanical vs polypharmacy -Optimize mind altering medications.  -PT RECOMMENDATION IS NOTED. -We will consult case management team to assist with discharge needs.  Diabetes mellitus -Sliding Scale.  Continue to optimize.  Essential hypertension -Home meds  include Atenolol 50 mg daily, losartan 25 mg daily -Blood pressure is controlled.    A. Fib - Eliquis and Statin  CAD - Aspirin and Statin.  BPH -Flomax.  OSA -Uses nightly CPAP. Continue the same.  Chronic pain syndrome -Patient was on several mind altering medications prior to admission.   -Optimize medication prior to discharge.    Mobility: Encourage ambulation. Pending PT eval Code Status:   Code Status: Full Code  Nutritional status: Body mass index is 33.91 kg/m.     Diet Order            Diet heart healthy/carb modified Room service appropriate? Yes; Fluid consistency: Thin  Diet effective now                 DVT prophylaxis:  apixaban (ELIQUIS) tablet 5 mg   Antimicrobials:  IV Rocephin and IV azithromycin Fluid: Stop fluids. Consultants: None Family Communication:  None at bedside  Status is: Inpatient  Remains inpatient appropriate because: -Requires continued sepsis work-up.  Dispo: The patient is from: Home              Anticipated d/c is to: Home vs HH.  Pending PT eval              Anticipated d/c date is: 2 days              Patient currently is not medically stable to d/c.       Infusions:   azithromycin 500 mg (12/15/19 2212)   cefTRIAXone (ROCEPHIN)  IV 1 g (12/15/19 2108)    Scheduled Meds:  apixaban  5 mg Oral BID  ARIPiprazole  5 mg Oral Daily   aspirin EC  81 mg Oral Daily   atenolol  50 mg Oral Daily   atorvastatin  80 mg Oral q1800   buPROPion  150 mg Oral Daily   DULoxetine  120 mg Oral Daily   insulin aspart  0-5 Units Subcutaneous QHS   insulin aspart  0-9 Units Subcutaneous TID WC   losartan  25 mg Oral Daily   tamsulosin  0.4 mg Oral QHS    Antimicrobials: Anti-infectives (From admission, onward)   Start     Dose/Rate Route Frequency Ordered Stop   12/14/19 2200  cefTRIAXone (ROCEPHIN) 1 g in sodium chloride 0.9 % 100 mL IVPB        1 g 200 mL/hr over 30 Minutes Intravenous Every 24 hours  12/14/19 0401     12/14/19 0500  azithromycin (ZITHROMAX) 500 mg in sodium chloride 0.9 % 250 mL IVPB  Status:  Discontinued        500 mg 250 mL/hr over 60 Minutes Intravenous Every 24 hours 12/14/19 0401 12/14/19 0405   12/13/19 2200  cefTRIAXone (ROCEPHIN) 2 g in sodium chloride 0.9 % 100 mL IVPB  Status:  Discontinued        2 g 200 mL/hr over 30 Minutes Intravenous Every 24 hours 12/13/19 2151 12/14/19 0405   12/13/19 2200  azithromycin (ZITHROMAX) 500 mg in sodium chloride 0.9 % 250 mL IVPB        500 mg 250 mL/hr over 60 Minutes Intravenous Every 24 hours 12/13/19 2151        PRN meds: albuterol, oxyCODONE-acetaminophen, promethazine, senna-docusate   Objective: Vitals:   12/16/19 0445 12/16/19 1415  BP: 125/73 125/60  Pulse: 89 81  Resp: 17 18  Temp: 99.1 F (37.3 C) 97.9 F (36.6 C)  SpO2:  96%    Intake/Output Summary (Last 24 hours) at 12/16/2019 1607 Last data filed at 12/16/2019 1017 Gross per 24 hour  Intake 1707.36 ml  Output 1950 ml  Net -242.64 ml   Filed Weights   12/13/19 2122  Weight: 101.2 kg   Weight change:  Body mass index is 33.91 kg/m.   Physical Exam: General exam: Pleasant, elderly Caucasian male. Not in physical distress Skin: No rashes, lesions or ulcers. HEENT: Atraumatic, normocephalic, no obvious bleeding Lungs: Clear to auscultation bilaterally CVS: Regular rate and rhythm, no murmur GI/Abd soft, nontender, nondistended, bowel sound present CNS: Somnolent. Opens eyes on verbal command. Answers orientation questions appropriately. Psychiatry: Mood appropriate Extremities: No edema, no calf tenderness  Data Review: I have personally reviewed the laboratory data and studies available.  Recent Labs  Lab 12/13/19 2150 12/14/19 0428 12/15/19 0328 12/16/19 0428  WBC 17.0* 16.4* 13.4* 12.1*  NEUTROABS 15.2*  --  9.4* 8.5*  HGB 16.0 13.9 13.5 13.0  HCT 50.5 42.6 41.5 38.4*  MCV 90.0 88.8 88.5 85.9  PLT 213 195 185 185    Recent Labs  Lab 12/13/19 2150 12/14/19 0428 12/15/19 0328 12/16/19 0428  NA 135 137 134* 131*  K 4.2 4.1 4.0 4.1  CL 103 105 103 104  CO2 19* 23 23 18*  GLUCOSE 191* 116* 120* 122*  BUN 16 13 8 9   CREATININE 1.08 1.10 0.96 0.79  CALCIUM 9.5 8.9 9.0 8.7*  MG  --   --  1.8  --   PHOS  --   --  2.0*  --     F/u labs ordered.  Signed, Bonnell Public, MD [facility]  12/16/2019 ° ° ° ° ° ° ° ° ° ° ° °

## 2019-12-16 NOTE — Progress Notes (Signed)
ABG sample was collected and sent to Lab. Lab was called and notified.  

## 2019-12-17 LAB — CBC WITH DIFFERENTIAL/PLATELET
Abs Immature Granulocytes: 0.04 10*3/uL (ref 0.00–0.07)
Basophils Absolute: 0.1 10*3/uL (ref 0.0–0.1)
Basophils Relative: 1 %
Eosinophils Absolute: 0.6 10*3/uL — ABNORMAL HIGH (ref 0.0–0.5)
Eosinophils Relative: 6 %
HCT: 39.9 % (ref 39.0–52.0)
Hemoglobin: 13.3 g/dL (ref 13.0–17.0)
Immature Granulocytes: 0 %
Lymphocytes Relative: 18 %
Lymphs Abs: 1.8 10*3/uL (ref 0.7–4.0)
MCH: 28.6 pg (ref 26.0–34.0)
MCHC: 33.3 g/dL (ref 30.0–36.0)
MCV: 85.8 fL (ref 80.0–100.0)
Monocytes Absolute: 1.4 10*3/uL — ABNORMAL HIGH (ref 0.1–1.0)
Monocytes Relative: 14 %
Neutro Abs: 6.3 10*3/uL (ref 1.7–7.7)
Neutrophils Relative %: 61 %
Platelets: 211 10*3/uL (ref 150–400)
RBC: 4.65 MIL/uL (ref 4.22–5.81)
RDW: 14.3 % (ref 11.5–15.5)
WBC: 10.2 10*3/uL (ref 4.0–10.5)
nRBC: 0 % (ref 0.0–0.2)

## 2019-12-17 LAB — GLUCOSE, CAPILLARY
Glucose-Capillary: 139 mg/dL — ABNORMAL HIGH (ref 70–99)
Glucose-Capillary: 108 mg/dL — ABNORMAL HIGH (ref 70–99)
Glucose-Capillary: 88 mg/dL (ref 70–99)
Glucose-Capillary: 94 mg/dL (ref 70–99)

## 2019-12-17 LAB — BASIC METABOLIC PANEL
Anion gap: 9 (ref 5–15)
BUN: 9 mg/dL (ref 8–23)
CO2: 22 mmol/L (ref 22–32)
Calcium: 8.9 mg/dL (ref 8.9–10.3)
Chloride: 102 mmol/L (ref 98–111)
Creatinine, Ser: 0.83 mg/dL (ref 0.61–1.24)
GFR, Estimated: >60 mL/min (ref >60)
Glucose, Bld: 102 mg/dL — ABNORMAL HIGH (ref 70–99)
Potassium: 3.9 mmol/L (ref 3.5–5.1)
Sodium: 133 mmol/L — ABNORMAL LOW (ref 135–145)

## 2019-12-17 NOTE — TOC Initial Note (Signed)
Transition of Care [facility]) - Initial/Assessment Note    Patient Details  Name: William Keith MRN: [id number] Date of Birth: 1940-12-17  [facility]) CM/SW Contact:    Marilu Favre, RN Phone Number: 12/17/2019, 4:47 PM  Clinical Narrative:                  Patient from [place] with wife. Already has walker at home.   PT recommending HHPT. Patient is agreeable. Patient wants NCM to call [facility] to see who their preferred provider is. NCM called spoke to receptionist was given admission coordinators number Joelene Millin called and left message awaiting call back   Expected Discharge Plan: [place]     Patient Goals and CMS Choice Patient states their goals for this hospitalization and ongoing recovery are:: to return to home CMS Medicare.gov Compare Post Acute Care list provided to:: Patient Choice offered to / list presented to : Patient  Expected Discharge Plan and Services Expected Discharge Plan: [place]   Discharge Planning Services: CM Consult Post Acute Care Choice: [place] arrangements for the past 2 months: Apartment                 DME Arranged: N/A         HH Arranged: PT          Prior Living Arrangements/Services Living arrangements for the past 2 months: Apartment Lives with:: Spouse Patient language and need for interpreter reviewed:: Yes Do you feel safe going back to the place where you live?: Yes      Need for Family Participation in Patient Care: Yes (Comment) Care giver support system in place?: Yes (comment) Current home services: DME Criminal Activity/Legal Involvement Pertinent to Current Situation/Hospitalization: No - Comment as needed  Activities of Daily Living Home Assistive Devices/Equipment: Eyeglasses ADL Screening (condition at time of admission) Patient's cognitive ability adequate to safely complete daily activities?: Yes Is the patient deaf or  have difficulty hearing?: No Does the patient have difficulty seeing, even when wearing glasses/contacts?: No Does the patient have difficulty concentrating, remembering, or making decisions?: No Patient able to express need for assistance with ADLs?: Yes Does the patient have difficulty dressing or bathing?: No Independently performs ADLs?: Yes (appropriate for developmental age) Does the patient have difficulty walking or climbing stairs?: No Weakness of Legs: None Weakness of Arms/Hands: None  Permission Sought/Granted   Permission granted to share information with : Yes, Verbal Permission Granted     Permission granted to share info w AGENCY: [facility]        Emotional Assessment Appearance:: Appears stated age Attitude/Demeanor/Rapport: Engaged Affect (typically observed): Accepting Orientation: : Oriented to Self,Oriented to Place,Oriented to  Time,Oriented to Situation Alcohol / Substance Use: Not Applicable Psych Involvement: No (comment)  Admission diagnosis:  Sepsis ([place]) [A41.9] Sepsis, due to unspecified organism, unspecified whether acute organ dysfunction present [facility]) [A41.9] Patient Active Problem List   Diagnosis Date Noted  . Leukocytosis 12/14/2019  . Fever 12/14/2019  . Headache 12/14/2019  . Generalized weakness 12/14/2019  . Sepsis ([place]) 09/27/2018   PCP:  System, Provider Not In Pharmacy:   [place], [place] [facility] [place] [address] Phone: [phone] Fax: [phone]     Social Determinants of Health (SDOH) Interventions    Readmission Risk Interventions No flowsheet data found.

## 2019-12-17 NOTE — Progress Notes (Signed)
RT NOTE:  Pt has home CPAP at beside. Pt refuses to wear tonight.

## 2019-12-17 NOTE — Progress Notes (Signed)
Responded to bed alarm, patient sitting on edge of bed.  Assisted patient to chair, chair alarm set.  Belongings and call light placed within reach.  Reminded patient to call for assistance before getting up.

## 2019-12-17 NOTE — Care Management Important Message (Signed)
Important Message  Patient Details  Name: William Keith MRN: [id number] Date of Birth: [birth year]   Medicare Important Message Given:  Yes     Orbie Pyo 12/17/2019, 4:29 PM

## 2019-12-17 NOTE — Progress Notes (Signed)
Physical Therapy Treatment Patient Details Name: William Keith MRN: [id number] DOB: October 19, 1940 Today's Date: 12/17/2019    History of Present Illness Pt is an 79 y.o. male admitted 12/13/19 after fall at home hitting his head without LOC, pt also endorses generalized weakness and fever. Workup for sepsis criteria. Head CT negative. PMH includes DM, HTN, afib, stroke.    PT Comments    Patient progressing towards physical therapy goals. Patient sitting EOB upon arrival. Patient ambulated 100' with RW and min guard, cues for RW management intermittently. Patient performed functional exercises seated in recliner and standing with focus on B LE strengthening. Patient continues to be limited by generalized weakness, impaired balance, and decreased activity tolerance. Continue to recommend HHPT following discharge to maximize functional independence.     Follow Up Recommendations  Home health PT;Supervision for mobility/OOB (pt may decline)     Equipment Recommendations  None recommended by PT    Recommendations for Other Services       Precautions / Restrictions Precautions Precautions: Fall Restrictions Weight Bearing Restrictions: No    Mobility  Bed Mobility               General bed mobility comments: sitting EOB on arrival  Transfers Overall transfer level: Needs assistance Equipment used: Rolling Eldredge Veldhuizen (2 wheeled) Transfers: Sit to/from Stand Sit to Stand: Min guard            Ambulation/Gait Ambulation/Gait assistance: Min guard Gait Distance (Feet): 100 Feet Assistive device: Rolling Darika Ildefonso (2 wheeled) Gait Pattern/deviations: Step-through pattern;Decreased stride length;Trunk flexed         Stairs             Wheelchair Mobility    Modified Rankin (Stroke Patients Only)       Balance Overall balance assessment: Needs assistance   Sitting balance-Leahy Scale: Good     Standing balance support: No upper extremity supported;During  functional activity Standing balance-Leahy Scale: Fair Standing balance comment: dynamic standing balance with no UE support, reaching for objects in closet                            Cognition Arousal/Alertness: Awake/alert Behavior During Therapy: WFL for tasks assessed/performed Overall Cognitive Status: Within Functional Limits for tasks assessed                                        Exercises General Exercises - Lower Extremity Long Arc Quad: AROM;Both;10 reps;Seated Hip Flexion/Marching: AROM;Both;10 reps;Seated Other Exercises Other Exercises: sit to stand 2 x 5 reps with no UE support    General Comments        Pertinent Vitals/Pain Pain Assessment: Faces Faces Pain Scale: Hurts little more Pain Location: Headache Pain Descriptors / Indicators: Headache Pain Intervention(s): Monitored during session    Home Living                      Prior Function            PT Goals (current goals can now be found in the care plan section) Acute Rehab PT Goals Patient Stated Goal: Feel better and return home PT Goal Formulation: With patient Time For Goal Achievement: 12/28/19 Potential to Achieve Goals: Good Progress towards PT goals: Progressing toward goals    Frequency    Min 3X/week      PT  Plan Current plan remains appropriate    Co-evaluation              AM-PAC PT "6 Clicks" Mobility   Outcome Measure  Help needed turning from your back to your side while in a flat bed without using bedrails?: None Help needed moving from lying on your back to sitting on the side of a flat bed without using bedrails?: None Help needed moving to and from a bed to a chair (including a wheelchair)?: A Little Help needed standing up from a chair using your arms (e.g., wheelchair or bedside chair)?: A Little Help needed to walk in hospital room?: A Little Help needed climbing 3-5 steps with a railing? : A Little 6 Click Score:  20    End of Session Equipment Utilized During Treatment: Gait belt Activity Tolerance: Patient tolerated treatment well Patient left: in chair;with call bell/phone within reach;with chair alarm set Nurse Communication: Mobility status PT Visit Diagnosis: Other abnormalities of gait and mobility (R26.89)     Time: 7001-7494 PT Time Calculation (min) (ACUTE ONLY): 24 min  Charges:  $Therapeutic Exercise: 8-22 mins $Therapeutic Activity: 8-22 mins                     Perrin Maltese, PT, DPT Acute Rehabilitation Services Pager [phone] Office [phone]    Melene Plan Allred 12/17/2019, 10:32 AM

## 2019-12-17 NOTE — Progress Notes (Signed)
PROGRESS NOTE    William Keith  DJS:[id number] DOB: Sep 25, 1940 DOA: 12/13/2019 PCP: System, Provider Not In   Chief Complaint  Patient presents with  . Fall    1600 fall at doctors office, waxes and wines. Lac on forehead, bleeding controlled. Eliqus for afib. A0x4 at this time.    Brief Narrative: Morbidly obese with history of diabetes mellitus, hypertension, hyperlipidemia, A. fib on Eliquis for stroke, OSA, chronic pain syndrome was admitted following a mechanical fall.  Reportedly he tripped and fell.  Patient has been on multiple mind altering medication prior to admission.  He was found to have a small laceration on forehead, CT head was normal, he had fever and there was initial concern for possible sepsis work-up was initiated, placed on IV antibiotics and was admitted.  Subjective: Alert,awake oriented to self, place, people, time Worked with pt temp 100 yesterday  Assessment & Plan:  Febrile episode unclear etiology, no recurrence of fever of 100.  Patient has been on antibiotics, UA no evidence of UTI CT showed no pneumonia, cultures data negative so far.  Covid and flu A/B-, blood culture no growth so far.  Procalcitonin 0.3 previously 2.1.  No leukocytosis.  Will discontinue antibiotics and monitor for next 24 hours off antibiotics.  Patient denies any dysuria or respiratory symptoms or neck stiffness or any abdominal pain.  Fall versus polypharmacy, PT OT encouraged.  Will need home health therapy.  Minimize psychotropic medication  Chronic pain syndrome patient on several psychotropic medication including meclizine, Flexeril, Abilify, Elavil Neurontin, Cymbalta, Wellbutrin, Percocet, Topamax.  Currently Flexeril and Neurontin on hold.  Will need close outpatient follow-up to resume meds closely.  Essential hypertension blood pressure is controlled continue current home meds  CAD no chest pain continue aspirin/statin  History of atrial fibrillation on Eliquis  BPH  continue Flomax  OSA on nightly CPAP  Morbid obesity BMI 33, will benefit with weight loss  Nutrition: Diet Order            Diet heart healthy/carb modified Room service appropriate? Yes; Fluid consistency: Thin  Diet effective now                Body mass index is 33.91 kg/m.   DVT prophylaxis: Eliquis Code Status:   Code Status: Full Code  Family Communication: plan of care discussed with patient at bedside.  Status is: Inpatient Remains inpatient appropriate because:Ongoing diagnostic testing needed not appropriate for outpatient work up and Inpatient level of care appropriate due to severity of illness  Dispo: The patient is from: Home              Anticipated d/c is to: Home w/ HHPT              Anticipated d/c date is: 1 day              Patient currently is not medically stable to d/c. Consultants:see note  Procedures:see note  Culture/Microbiology    Component Value Date/Time   SDES BLOOD LEFT ANTECUBITAL 12/13/2019 2143   SDES BLOOD RIGHT ARM 12/13/2019 2143   Kelly  12/13/2019 2143    BOTTLES DRAWN AEROBIC AND ANAEROBIC Blood Culture adequate volume   SPECREQUEST  12/13/2019 2143    BOTTLES DRAWN AEROBIC AND ANAEROBIC Blood Culture adequate volume   CULT  12/13/2019 2143    NO GROWTH 3 DAYS Performed at [facility], [place] [address] [place], [place] [address]    CULT  12/13/2019 2143  NO GROWTH 3 DAYS Performed at [facility], [place] [address], [place], [place] [address]    REPTSTATUS PENDING 12/13/2019 2143   REPTSTATUS PENDING 12/13/2019 2143    Other culture-see note  Medications: Scheduled Meds: . apixaban  5 mg Oral BID  . ARIPiprazole  5 mg Oral Daily  . aspirin EC  81 mg Oral Daily  . atenolol  50 mg Oral Daily  . atorvastatin  80 mg Oral q1800  . buPROPion  150 mg Oral Daily  . DULoxetine  120 mg Oral Daily  . insulin aspart  0-5 Units Subcutaneous QHS  . insulin aspart  0-9 Units Subcutaneous TID WC  . losartan   25 mg Oral Daily  . tamsulosin  0.4 mg Oral QHS   Continuous Infusions:  Antimicrobials: Anti-infectives (From admission, onward)   Start     Dose/Rate Route Frequency Ordered Stop   12/14/19 2200  cefTRIAXone (ROCEPHIN) 1 g in sodium chloride 0.9 % 100 mL IVPB  Status:  Discontinued        1 g 200 mL/hr over 30 Minutes Intravenous Every 24 hours 12/14/19 0401 12/17/19 1157   12/14/19 0500  azithromycin (ZITHROMAX) 500 mg in sodium chloride 0.9 % 250 mL IVPB  Status:  Discontinued        500 mg 250 mL/hr over 60 Minutes Intravenous Every 24 hours 12/14/19 0401 12/14/19 0405   12/13/19 2200  cefTRIAXone (ROCEPHIN) 2 g in sodium chloride 0.9 % 100 mL IVPB  Status:  Discontinued        2 g 200 mL/hr over 30 Minutes Intravenous Every 24 hours 12/13/19 2151 12/14/19 0405   12/13/19 2200  azithromycin (ZITHROMAX) 500 mg in sodium chloride 0.9 % 250 mL IVPB  Status:  Discontinued        500 mg 250 mL/hr over 60 Minutes Intravenous Every 24 hours 12/13/19 2151 12/17/19 1157     Objective: Vitals: Today's Vitals   12/16/19 1017 12/16/19 1415 12/16/19 2046 12/17/19 0633  BP:  125/60 133/60 131/61  Pulse:  81 85 88  Resp:  18 17 16   Temp:  97.9 F (36.6 C) 100 F (37.8 C) 98.1 F (36.7 C)  TempSrc:   Oral Oral  SpO2:  96% 92% 94%  Weight:      Height:      PainSc: 0-No pain  0-No pain     Intake/Output Summary (Last 24 hours) at 12/17/2019 1157 Last data filed at 12/17/2019 0840 Gross per 24 hour  Intake 610 ml  Output 1800 ml  Net -1190 ml   Filed Weights   12/13/19 2122  Weight: 101.2 kg   Weight change:   Intake/Output from previous day: 12/12 0701 - 12/13 0700 In: 480 [P.O.:480] Out: 2400 [Urine:2400] Intake/Output this shift: Total I/O In: 130 [P.O.:130] Out: -   Examination: General exam: AAOx3 ,NAD, weak appearing. HEENT:Oral mucosa moist, Ear/Nose WNL grossly,dentition normal. Respiratory system: bilaterally clear,no wheezing or crackles,no use of  accessory muscle, non tender. Cardiovascular system: S1 & S2 +, regular, No JVD. Gastrointestinal system: Abdomen soft, NT,ND, BS+. Nervous System:Alert, awake, moving extremities and grossly nonfocal Extremities: No edema, distal peripheral pulses palpable.  Skin: No rashes,no icterus. MSK: Normal muscle bulk,tone, power  Data Reviewed: I have personally reviewed following labs and imaging studies CBC: Recent Labs  Lab 12/13/19 2150 12/14/19 0428 12/15/19 0328 12/16/19 0428 12/17/19 0328  WBC 17.0* 16.4* 13.4* 12.1* 10.2  NEUTROABS 15.2*  --  9.4* 8.5* 6.3  HGB  16.0 13.9 13.5 13.0 13.3  HCT 50.5 42.6 41.5 38.4* 39.9  MCV 90.0 88.8 88.5 85.9 85.8  PLT 213 195 185 185 270   Basic Metabolic Panel: Recent Labs  Lab 12/13/19 2150 12/14/19 0428 12/15/19 0328 12/16/19 0428 12/17/19 0328  NA 135 137 134* 131* 133*  K 4.2 4.1 4.0 4.1 3.9  CL 103 105 103 104 102  CO2 19* 23 23 18* 22  GLUCOSE 191* 116* 120* 122* 102*  BUN 16 13 8 9 9   CREATININE 1.08 1.10 0.96 0.79 0.83  CALCIUM 9.5 8.9 9.0 8.7* 8.9  MG  --   --  1.8  --   --   PHOS  --   --  2.0*  --   --    GFR: Estimated Creatinine Clearance: 81.8 mL/min (by C-G formula based on SCr of 0.83 mg/dL). Liver Function Tests: Recent Labs  Lab 12/13/19 2150 12/14/19 0428  AST 26 19  ALT 25 21  ALKPHOS 106 81  BILITOT 0.8 0.5  PROT 6.5 5.5*  ALBUMIN 3.7 3.0*   No results for input(s): LIPASE, AMYLASE in the last 168 hours. No results for input(s): AMMONIA in the last 168 hours. Coagulation Profile: Recent Labs  Lab 12/13/19 2150 12/14/19 0428  INR 1.1 1.1   Cardiac Enzymes: No results for input(s): CKTOTAL, CKMB, CKMBINDEX, TROPONINI in the last 168 hours. BNP (last 3 results) No results for input(s): PROBNP in the last 8760 hours. HbA1C: No results for input(s): HGBA1C in the last 72 hours. CBG: Recent Labs  Lab 12/16/19 1136 12/16/19 1712 12/16/19 2042 12/17/19 0740 12/17/19 1139  GLUCAP 114* 80  138* 139* 108*   Lipid Profile: No results for input(s): CHOL, HDL, LDLCALC, TRIG, CHOLHDL, LDLDIRECT in the last 72 hours. Thyroid Function Tests: No results for input(s): TSH, T4TOTAL, FREET4, T3FREE, THYROIDAB in the last 72 hours. Anemia Panel: No results for input(s): VITAMINB12, FOLATE, FERRITIN, TIBC, IRON, RETICCTPCT in the last 72 hours. Sepsis Labs: Recent Labs  Lab 12/13/19 2150 12/13/19 2346 12/14/19 0428 12/15/19 0328 12/16/19 1626  PROCALCITON  --   --  2.12  --  0.39  LATICACIDVEN 2.9* 2.0*  --  0.9  --     Recent Results (from the past 240 hour(s))  Blood Culture (routine x 2)     Status: None (Preliminary result)   Collection Time: 12/13/19  9:43 PM   Specimen: BLOOD  Result Value Ref Range Status   Specimen Description BLOOD LEFT ANTECUBITAL  Final   Special Requests   Final    BOTTLES DRAWN AEROBIC AND ANAEROBIC Blood Culture adequate volume   Culture   Final    NO GROWTH 3 DAYS Performed at [facility], [address], [place], [place] [address]    Report Status PENDING  Incomplete  Blood Culture (routine x 2)     Status: None (Preliminary result)   Collection Time: 12/13/19  9:43 PM   Specimen: BLOOD  Result Value Ref Range Status   Specimen Description BLOOD RIGHT ARM  Final   Special Requests   Final    BOTTLES DRAWN AEROBIC AND ANAEROBIC Blood Culture adequate volume   Culture   Final    NO GROWTH 3 DAYS Performed at [facility], [place] [address] [place], [place] [address]    Report Status PENDING  Incomplete  Resp Panel by RT-PCR (Flu A&B, Covid) Nasopharyngeal Swab     Status: None   Collection Time: 12/13/19 10:09 PM  Specimen: Nasopharyngeal Swab; Nasopharyngeal(NP) swabs in vial transport medium  Result Value Ref Range Status   SARS Coronavirus 2 by RT PCR NEGATIVE NEGATIVE Final    Comment: (NOTE) SARS-CoV-2 target nucleic acids are NOT DETECTED.  The SARS-CoV-2 RNA is generally detectable in upper respiratory specimens  during the acute phase of infection. The lowest concentration of SARS-CoV-2 viral copies this assay can detect is 138 copies/mL. A negative result does not preclude SARS-Cov-2 infection and should not be used as the sole basis for treatment or other patient management decisions. A negative result may occur with  improper specimen collection/handling, submission of specimen other than nasopharyngeal swab, presence of viral mutation(s) within the areas targeted by this assay, and inadequate number of viral copies(<138 copies/mL). A negative result must be combined with clinical observations, patient history, and epidemiological information. The expected result is Negative.  Fact Sheet for Patients:  EntrepreneurPulse.com.au  Fact Sheet for Healthcare Providers:  IncredibleEmployment.be  This test is no t yet approved or cleared by the [place] FDA and  has been authorized for detection and/or diagnosis of SARS-CoV-2 by FDA under an Emergency Use Authorization (EUA). This EUA will remain  in effect (meaning this test can be used) for the duration of the COVID-19 declaration under Section 564(b)(1) of the Act, 21 U.S.C.section 360bbb-3(b)(1), unless the authorization is terminated  or revoked sooner.       Influenza A by PCR NEGATIVE NEGATIVE Final   Influenza B by PCR NEGATIVE NEGATIVE Final    Comment: (NOTE) The Xpert Xpress SARS-CoV-2/FLU/RSV plus assay is intended as an aid in the diagnosis of influenza from Nasopharyngeal swab specimens and should not be used as a sole basis for treatment. Nasal washings and aspirates are unacceptable for Xpert Xpress SARS-CoV-2/FLU/RSV testing.  Fact Sheet for Patients: EntrepreneurPulse.com.au  Fact Sheet for Healthcare Providers: IncredibleEmployment.be  This test is not yet approved or cleared by the [place] FDA and has been authorized for detection  and/or diagnosis of SARS-CoV-2 by FDA under an Emergency Use Authorization (EUA). This EUA will remain in effect (meaning this test can be used) for the duration of the COVID-19 declaration under Section 564(b)(1) of the Act, 21 U.S.C. section 360bbb-3(b)(1), unless the authorization is terminated or revoked.  Performed at [facility], [place] [address] [place], [place] [address]      Radiology Studies: No results found.   LOS: 3 days   Antonieta Pert, MD [facility]  12/17/2019, 11:57 AM

## 2019-12-17 NOTE — Progress Notes (Signed)
Patient found towards end of his bed, bed alarm off, pulled off Primo, urine on the floor. Patient stated he got up and walked to other side of his bed.  Reminded patient he was here due to a fall at home and he was to use his call light if he wanted to get up.  Patient stated 'I unplugged it" when asked if he turned off the bed alarm.  Advised patient he should not turn off his bed alarm, it is for his safety, reminded patient again to use call light for assistance when getting up.  Call light, belongings, urinal in reach, over the bed table within reach. Bed alarm set.

## 2019-12-18 MED ORDER — GABAPENTIN 100 MG PO CAPS: 100.0000 mg | ORAL_CAPSULE | Freq: Three times a day (TID) | ORAL | 0 refills | Status: DC

## 2019-12-18 NOTE — Progress Notes (Signed)
Physical Therapy Treatment Patient Details Name: William Keith MRN: [id number] DOB: 11/26/1940 Today's Date: 12/18/2019    History of Present Illness Pt is an 79 y.o. male admitted 12/13/19 after fall at home hitting his head without LOC, pt also endorses generalized weakness and fever. Workup for sepsis criteria. Head CT negative. PMH includes DM, HTN, afib, stroke.    PT Comments    Patient progressing towards physical therapy goals. Patient ambulated 125' with RW and supervision. Patient performed functional exercises in sitting and standing focusing on B LE strengthening and balance. Educated patient on exercises to perform at home to continue strengthening and building endurance to return to PLOF. Patient continues to be limited by decreased activity tolerance, impaired balance, generalized weakness. Continue to recommend HHPT following discharge to maximize functional independence and return to PLOF.     Follow Up Recommendations  Home health PT;Supervision for mobility/OOB     Equipment Recommendations  None recommended by PT    Recommendations for Other Services       Precautions / Restrictions Precautions Precautions: Fall Restrictions Weight Bearing Restrictions: No    Mobility  Bed Mobility Overal bed mobility: Needs Assistance Bed Mobility: Supine to Sit     Supine to sit: Min assist     General bed mobility comments: minA for HHA to elevate trunk  Transfers Overall transfer level: Needs assistance Equipment used: Rolling Jatoya Armbrister (2 wheeled) Transfers: Sit to/from Stand Sit to Stand: Supervision            Ambulation/Gait Ambulation/Gait assistance: Supervision Gait Distance (Feet): 125 Feet Assistive device: Rolling Odella Appelhans (2 wheeled) Gait Pattern/deviations: Step-through pattern;Decreased stride length;Trunk flexed Gait velocity: Decreased       Stairs             Wheelchair Mobility    Modified Rankin (Stroke Patients Only)        Balance Overall balance assessment: Needs assistance Sitting-balance support: No upper extremity supported;Feet supported Sitting balance-Leahy Scale: Good     Standing balance support: No upper extremity supported;During functional activity Standing balance-Leahy Scale: Fair                              Cognition Arousal/Alertness: Awake/alert Behavior During Therapy: WFL for tasks assessed/performed Overall Cognitive Status: Within Functional Limits for tasks assessed                                        Exercises General Exercises - Lower Extremity Long Arc Quad: AROM;Both;20 reps;Seated Hip Flexion/Marching: AROM;Both;20 reps;Standing Mini-Sqauts: 15 reps    General Comments        Pertinent Vitals/Pain Pain Assessment: No/denies pain    Home Living                      Prior Function            PT Goals (current goals can now be found in the care plan section) Acute Rehab PT Goals Patient Stated Goal: Feel better and return home PT Goal Formulation: With patient Time For Goal Achievement: 12/28/19 Potential to Achieve Goals: Good Progress towards PT goals: Progressing toward goals    Frequency    Min 3X/week      PT Plan Current plan remains appropriate    Co-evaluation  AM-PAC PT "6 Clicks" Mobility   Outcome Measure  Help needed turning from your back to your side while in a flat bed without using bedrails?: None Help needed moving from lying on your back to sitting on the side of a flat bed without using bedrails?: A Little Help needed moving to and from a bed to a chair (including a wheelchair)?: A Little Help needed standing up from a chair using your arms (e.g., wheelchair or bedside chair)?: A Little Help needed to walk in hospital room?: A Little Help needed climbing 3-5 steps with a railing? : A Little 6 Click Score: 19    End of Session Equipment Utilized During  Treatment: Gait belt Activity Tolerance: Patient tolerated treatment well Patient left: in chair;with call bell/phone within reach;with chair alarm set Nurse Communication: Mobility status PT Visit Diagnosis: Other abnormalities of gait and mobility (R26.89)     Time: 8979-1504 PT Time Calculation (min) (ACUTE ONLY): 23 min  Charges:  $Therapeutic Exercise: 8-22 mins $Therapeutic Activity: 8-22 mins                     Perrin Maltese, PT, DPT Acute Rehabilitation Services Pager [phone] Office [phone]    William Keith 12/18/2019, 10:33 AM

## 2019-12-18 NOTE — TOC Progression Note (Signed)
Transition of Care [facility]) - Progression Note    Patient Details  Name: William Keith MRN: [id number] Date of Birth: Nov 02, 1940  Transition of Care [facility]) CM/SW Contact  Jacalyn Lefevre Edson Snowball, RN Phone Number: 12/18/2019, 10:41 AM  Clinical Narrative:     Spoke to Education officer, museum at [employer] , [place] [phone]. Joelene Millin requested order for HHPT and face to face be faxed to her at [phone] ( same done), and their in house therapy will provide services.   Expected Discharge Plan: [place]    Expected Discharge Plan and Services Expected Discharge Plan: [place]   Discharge Planning Services: CM Consult Post Acute Care Choice: [place] arrangements for the past 2 months: Apartment Expected Discharge Date: 12/18/19               DME Arranged: N/A         HH Arranged: PT           Social Determinants of Health (SDOH) Interventions    Readmission Risk Interventions No flowsheet data found.

## 2019-12-18 NOTE — Discharge Summary (Addendum)
Physician Discharge Summary  William Keith SFK:[id number] DOB: 11-12-1940 DOA: 12/13/2019  PCP: System, Provider Not In  Admit date: 12/13/2019 Discharge date: 12/18/2019  Admitted From: home Disposition:  hh  Recommendations for Outpatient Follow-up:  1. Follow up with PCP in 1-2 weeks 2. Please obtain BMP/CBC in one week 3. Please follow up on the following pending results:  Home Health:yes  Equipment/Devices: none  Discharge Condition: Stable Code Status:   Code Status: Full Code Diet recommendation:  Diet Order            Diet - low sodium heart healthy           Diet heart healthy/carb modified Room service appropriate? Yes; Fluid consistency: Thin  Diet effective now                  Brief/Interim Summary: 79 year old  OBESE MALE w/ history of diabetes mellitus, hypertension, hyperlipidemia, A. fib on Eliquis for stroke, OSA, chronic pain syndrome was admitted following a mechanical fall.  Reportedly he tripped and fell.  Patient has been on multiple mind altering medication prior to admission.  He was found to have a small laceration on forehead, CT head was normal, he had fever and there was initial concern for possible sepsis work-up was initiated, placed on IV antibiotics and was admitted. Pain control achieved.  Patient remained stable.  Seen by PT OT and has advised home health therapy.  Antibiotics are discontinued patient will monitor off antibiotics and remain afebrile for more than 24 hours, work-up unremarkable for any kind of infection no evidence of UTI or pneumonia and blood culture has been negative.  Will discharge home with instruction to follow-up PCP  Discharge Diagnoses:   Febrile episode unclear etiology, no recurrence of fever after stopping antibiotics.  Sepsis ruled out.  UA no evidence of UTI CT showed no pneumonia, cultures data negative so far.  Covid and flu A/B-, blood culture no growth so far.  Procalcitonin 0.3 previously 2.1.  No  leukocytosis.   Fall versus polypharmacy, PT OT encouraged.  Will need home health therapy.  Minimize psychotropic medication and follow-up with PCP  Chronic pain syndrome patient on several psychotropic medication including meclizine, Flexeril, Abilify, Elavil Neurontin, Cymbalta, Wellbutrin, Percocet, Topamax.  Currently Flexeril and Neurontin on hold.  RESUME neurontin at lower dose ,off elavil. will need close outpatient follow-up to resume meds closely.  Patient has been instructed  Essential hypertension controlled on home meds.    CAD no chest pain continue aspirin/statin  History of atrial fibrillation on Eliquis.  Remains same.  Seen by PT OT.  If he has ongoing fall issue he will need to discuss with his PCP or cardiology regarding safety of his anticoagulation  BPH: Flomax  OSA on nightly CPAP he refused here.  Morbid obesity BMI 33, will benefit with weight loss  Consults:  PT OT  Subjective: Afebrile overnight.  Resting comfortably is alert awake oriented. FEELS Ready for home today.  Discharge Exam: Vitals:   12/17/19 1959 12/18/19 0448  BP: (!) 146/72 (!) 141/68  Pulse: 81 66  Resp: 20 18  Temp: 98.2 F (36.8 C) 98 F (36.7 C)  SpO2: 97% 98%   General: Pt is alert, awake, not in acute distress Cardiovascular: RRR, S1/S2 +, no rubs, no gallops Respiratory: CTA bilaterally, no wheezing, no rhonchi Abdominal: Soft, NT, ND, bowel sounds + Extremities: no edema, no cyanosis  Discharge Instructions  Discharge Instructions    Diet - low sodium heart healthy  Complete by: As directed    Discharge instructions   Complete by: As directed    We have minimized the medication that may make you fall so please follow-up with your PCP within 5 to 7 days to discuss about this medication.  Try to wean it off slowly on additional medication as tolerated.  Please call call MD or return to ER for similar or worsening recurring problem that brought you to  hospital or if any fever,nausea/vomiting,abdominal pain, uncontrolled pain, chest pain,  shortness of breath or any other alarming symptoms.  Please follow-up your doctor as instructed in a week time and call the office for appointment.  Please avoid alcohol, smoking, or any other illicit substance and maintain healthy habits including taking your regular medications as prescribed.  You were cared for by a hospitalist during your hospital stay. If you have any questions about your discharge medications or the care you received while you were in the hospital after you are discharged, you can call the unit and ask to speak with the hospitalist on call if the hospitalist that took care of you is not available.  Once you are discharged, your primary care physician will handle any further medical issues. Please note that NO REFILLS for any discharge medications will be authorized once you are discharged, as it is imperative that you return to your primary care physician (or establish a relationship with a primary care physician if you do not have one) for your aftercare needs so that they can reassess your need for medications and monitor your lab values   Increase activity slowly   Complete by: As directed    No wound care   Complete by: As directed      Allergies as of 12/18/2019      Reactions   Dilaudid [hydromorphone Hcl] Other (See Comments)   hallucination      Medication List    STOP taking these medications   amitriptyline 25 MG tablet Commonly known as: ELAVIL   cyclobenzaprine 10 MG tablet Commonly known as: FLEXERIL   meclizine 25 MG tablet Commonly known as: ANTIVERT   ramipril 10 MG capsule Commonly known as: ALTACE     TAKE these medications   acetaminophen 325 MG tablet Commonly known as: TYLENOL Take 650 mg by mouth every 6 (six) hours as needed for mild pain, fever or headache.   albuterol 108 (90 Base) MCG/ACT inhaler Commonly known as: VENTOLIN HFA Inhale 1  puff into the lungs every 4 (four) hours as needed for wheezing or shortness of breath.   ARIPiprazole 5 MG tablet Commonly known as: ABILIFY Take 1 tablet by mouth daily.   aspirin EC 81 MG tablet Take 81 mg by mouth daily.   atenolol 50 MG tablet Commonly known as: TENORMIN Take 50 mg by mouth daily.   atorvastatin 80 MG tablet Commonly known as: LIPITOR Take 80 mg by mouth daily.   buPROPion 150 MG 12 hr tablet Commonly known as: WELLBUTRIN SR Take 150 mg by mouth daily.   D2000 Ultra Strength 50 MCG (2000 UT) Caps Generic drug: Cholecalciferol Take 2,000 Units by mouth daily.   diclofenac Sodium 1 % Gel Commonly known as: VOLTAREN Apply 2-4 g topically 4 (four) times daily as needed.   DULoxetine 60 MG capsule Commonly known as: CYMBALTA Take 2 capsules by mouth daily.   Eliquis 5 MG Tabs tablet Generic drug: apixaban Take 5 mg by mouth 2 (two) times daily.   fluticasone 50 MCG/ACT nasal  spray Commonly known as: FLONASE Place 2 sprays into the nose 2 (two) times daily as needed for rhinitis.   gabapentin 100 MG capsule Commonly known as: NEURONTIN Take 1 capsule (100 mg total) by mouth 3 (three) times daily for 10 days. What changed:   medication strength  how much to take   losartan 50 MG tablet Commonly known as: COZAAR Take 25 mg by mouth daily.   multivitamin with minerals tablet Take 1 tablet by mouth at bedtime.   nitroGLYCERIN 0.4 MG/SPRAY spray Commonly known as: NITROLINGUAL Place 1 spray under the tongue every 5 (five) minutes x 3 doses as needed for chest pain.   omeprazole 40 MG capsule Commonly known as: PRILOSEC Take 40 mg by mouth daily.   oxyCODONE-acetaminophen 5-325 MG tablet Commonly known as: PERCOCET/ROXICET Take 1 tablet by mouth every 6 (six) hours as needed for moderate pain.   tamsulosin 0.4 MG Caps capsule Commonly known as: FLOMAX Take 0.4 mg by mouth at bedtime.   topiramate 50 MG tablet Commonly known as:  TOPAMAX Take 50 mg by mouth at bedtime.       Follow-up Information    PCP Follow up in 1 week(s).              Allergies  Allergen Reactions  . Dilaudid [Hydromorphone Hcl] Other (See Comments)    hallucination    The results of significant diagnostics from this hospitalization (including imaging, microbiology, ancillary and laboratory) are listed below for reference.    Microbiology: Recent Results (from the past 240 hour(s))  Blood Culture (routine x 2)     Status: None (Preliminary result)   Collection Time: 12/13/19  9:43 PM   Specimen: BLOOD  Result Value Ref Range Status   Specimen Description BLOOD LEFT ANTECUBITAL  Final   Special Requests   Final    BOTTLES DRAWN AEROBIC AND ANAEROBIC Blood Culture adequate volume   Culture   Final    NO GROWTH 3 DAYS Performed at [facility], [address], [place], [place] [address]    Report Status PENDING  Incomplete  Blood Culture (routine x 2)     Status: None (Preliminary result)   Collection Time: 12/13/19  9:43 PM   Specimen: BLOOD  Result Value Ref Range Status   Specimen Description BLOOD RIGHT ARM  Final   Special Requests   Final    BOTTLES DRAWN AEROBIC AND ANAEROBIC Blood Culture adequate volume   Culture   Final    NO GROWTH 3 DAYS Performed at [facility], [place] [address] [place], [place] [address]    Report Status PENDING  Incomplete  Resp Panel by RT-PCR (Flu A&B, Covid) Nasopharyngeal Swab     Status: None   Collection Time: 12/13/19 10:09 PM   Specimen: Nasopharyngeal Swab; Nasopharyngeal(NP) swabs in vial transport medium  Result Value Ref Range Status   SARS Coronavirus 2 by RT PCR NEGATIVE NEGATIVE Final    Comment: (NOTE) SARS-CoV-2 target nucleic acids are NOT DETECTED.  The SARS-CoV-2 RNA is generally detectable in upper respiratory specimens during the acute phase of infection. The lowest concentration of SARS-CoV-2 viral copies this assay can detect is 138 copies/mL. A  negative result does not preclude SARS-Cov-2 infection and should not be used as the sole basis for treatment or other patient management decisions. A negative result may occur with  improper specimen collection/handling, submission of specimen other than nasopharyngeal swab, presence of viral mutation(s) within the areas targeted by  this assay, and inadequate number of viral copies(<138 copies/mL). A negative result must be combined with clinical observations, patient history, and epidemiological information. The expected result is Negative.  Fact Sheet for Patients:  EntrepreneurPulse.com.au  Fact Sheet for Healthcare Providers:  IncredibleEmployment.be  This test is no t yet approved or cleared by the [place] FDA and  has been authorized for detection and/or diagnosis of SARS-CoV-2 by FDA under an Emergency Use Authorization (EUA). This EUA will remain  in effect (meaning this test can be used) for the duration of the COVID-19 declaration under Section 564(b)(1) of the Act, 21 U.S.C.section 360bbb-3(b)(1), unless the authorization is terminated  or revoked sooner.       Influenza A by PCR NEGATIVE NEGATIVE Final   Influenza B by PCR NEGATIVE NEGATIVE Final    Comment: (NOTE) The Xpert Xpress SARS-CoV-2/FLU/RSV plus assay is intended as an aid in the diagnosis of influenza from Nasopharyngeal swab specimens and should not be used as a sole basis for treatment. Nasal washings and aspirates are unacceptable for Xpert Xpress SARS-CoV-2/FLU/RSV testing.  Fact Sheet for Patients: EntrepreneurPulse.com.au  Fact Sheet for Healthcare Providers: IncredibleEmployment.be  This test is not yet approved or cleared by the [place] FDA and has been authorized for detection and/or diagnosis of SARS-CoV-2 by FDA under an Emergency Use Authorization (EUA). This EUA will remain in effect (meaning this test can  be used) for the duration of the COVID-19 declaration under Section 564(b)(1) of the Act, 21 U.S.C. section 360bbb-3(b)(1), unless the authorization is terminated or revoked.  Performed at [facility], [place] [address] [place], [place] [address]     Procedures/Studies: CT Head Wo Contrast  Result Date: 12/13/2019 CLINICAL DATA:  got tripped and fell and hit his head EXAM: CT HEAD WITHOUT CONTRAST TECHNIQUE: Contiguous axial images were obtained from the base of the skull through the vertex without intravenous contrast. COMPARISON:  CT head 09/27/2018, CT head 07/22/2017. FINDINGS: Brain: Cerebral ventricle sizes are concordant with the degree of cerebral volume loss. Patchy and confluent areas of decreased attenuation are noted throughout the deep and periventricular white matter of the cerebral hemispheres bilaterally, compatible with chronic microvascular ischemic disease. 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 No evidence of large-territorial acute infarction. No parenchymal hemorrhage. No mass lesion. No extra-axial collection. No mass effect or midline shift. No hydrocephalus. Basilar cisterns are patent. Vascular: No hyperdense vessel. Skull: No acute fracture or focal lesion. Sinuses/Orbits: Paranasal sinuses and mastoid air cells are clear. Bilateral lens replacement. Otherwise orbits are unremarkable. Other: None. IMPRESSION: No acute intracranial abnormality. Electronically Signed   By: Iven Finn M.D.   On: 12/13/2019 22:29   CT ANGIO CHEST PE W OR WO CONTRAST  Result Date: 12/14/2019 CLINICAL DATA:  High probability for pulmonary embolism. EXAM: CT ANGIOGRAPHY CHEST WITH CONTRAST TECHNIQUE: Multidetector CT imaging of the chest was performed using the standard protocol during bolus administration of intravenous contrast. Multiplanar CT image reconstructions and MIPs were obtained to evaluate the vascular anatomy. CONTRAST:   116mL OMNIPAQUE IOHEXOL 350 MG/ML SOLN COMPARISON:  None. FINDINGS: Cardiovascular: Suboptimal, heterogeneous opacification of the pulmonary arteries with variable density from transient interruption of contrast. No evidence of pulmonary embolism. Normal heart size. No pericardial effusion. Atheromatous calcification of the aorta and coronaries. Mediastinum/Nodes: Negative for adenopathy or mass. Lungs/Pleura: Mild dependent atelectasis. Few lower lobe airways appear partially collapsed, suggesting bronchomalacia. 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 There are associated embolization coils. Dimensions are similar to a 2015 abdominal CT report from [facility]. Left nephrolithiasis. Musculoskeletal: Generalized spondylosis bridging osteophytes. 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 Review of the MIP images confirms the above findings. IMPRESSION: 1. Suboptimal pulmonary artery opacification. No evidence of pulmonary embolism. 2. Mild atelectasis. 3. Aortic Atherosclerosis (ICD10-I70.0).  Coronary atherosclerosis. Electronically Signed   By: Monte Fantasia M.D.   On: 12/14/2019 05:26   DG Chest Port 1 View  Result Date: 12/13/2019 CLINICAL DATA:  Altered mental state EXAM: PORTABLE CHEST 1 VIEW COMPARISON:  09/27/2018 FINDINGS: Surgical hardware in the cervical spine. No focal opacity or pleural effusion. Normal cardiomediastinal silhouette. No pneumothorax IMPRESSION: No active disease. Electronically Signed   By: Donavan Foil M.D.   On: 12/13/2019 22:06    Labs: BNP (last 3 results) No results for input(s): BNP in the last 8760 hours. Basic Metabolic Panel: Recent Labs  Lab 12/13/19 2150 12/14/19 0428 12/15/19 0328 12/16/19 0428 12/17/19 0328  NA 135 137 134* 131* 133*  K 4.2 4.1 4.0 4.1 3.9  CL 103 105 103 104 102  CO2 19* 23 23 18* 22  GLUCOSE 191* 116* 120* 122* 102*  BUN 16 13 8 9  9   CREATININE 1.08 1.10 0.96 0.79 0.83  CALCIUM 9.5 8.9 9.0 8.7* 8.9  MG  --   --  1.8  --   --   PHOS  --   --  2.0*  --   --    Liver Function Tests: Recent Labs  Lab 12/13/19 2150 12/14/19 0428  AST 26 19  ALT 25 21  ALKPHOS 106 81  BILITOT 0.8 0.5  PROT 6.5 5.5*  ALBUMIN 3.7 3.0*   No results for input(s): LIPASE, AMYLASE in the last 168 hours. No results for input(s): AMMONIA in the last 168 hours. CBC: Recent Labs  Lab 12/13/19 2150 12/14/19 0428 12/15/19 0328 12/16/19 0428 12/17/19 0328  WBC 17.0* 16.4* 13.4* 12.1* 10.2  NEUTROABS 15.2*  --  9.4* 8.5* 6.3  HGB 16.0 13.9 13.5 13.0 13.3  HCT 50.5 42.6 41.5 38.4* 39.9  MCV 90.0 88.8 88.5 85.9 85.8  PLT 213 195 185 185 211   Cardiac Enzymes: No results for input(s): CKTOTAL, CKMB, CKMBINDEX, TROPONINI in the last 168 hours. BNP: Invalid input(s): POCBNP CBG: Recent Labs  Lab 12/16/19 2042 12/17/19 0740 12/17/19 1139 12/17/19 1637 12/17/19 2245  GLUCAP 138* 139* 108* 88 94   D-Dimer No results for input(s): DDIMER in the last 72 hours. Hgb A1c No results for input(s): HGBA1C in the last 72 hours. Lipid Profile No results for input(s): CHOL, HDL, LDLCALC, TRIG, CHOLHDL, LDLDIRECT in the last 72 hours. Thyroid function studies No results for input(s): TSH, T4TOTAL, T3FREE, THYROIDAB in the last 72 hours.  Invalid input(s): FREET3 Anemia work up No results for input(s): VITAMINB12, FOLATE, FERRITIN, TIBC, IRON, RETICCTPCT in the last 72 hours. Urinalysis    Component Value Date/Time   COLORURINE YELLOW 12/13/2019 2150   APPEARANCEUR CLEAR 12/13/2019 2150   LABSPEC 1.021 12/13/2019 2150   PHURINE 5.0 12/13/2019 2150   GLUCOSEU NEGATIVE 12/13/2019 2150   HGBUR NEGATIVE 12/13/2019 2150   [place] NEGATIVE 12/13/2019 2150   [place] NEGATIVE 12/13/2019 2150   PROTEINUR NEGATIVE 12/13/2019 2150   NITRITE NEGATIVE 12/13/2019 2150   LEUKOCYTESUR NEGATIVE 12/13/2019 2150   Sepsis Labs Invalid  input(s): PROCALCITONIN,  WBC,  LACTICIDVEN Microbiology Recent Results (from the past 240 hour(s))  Blood Culture (routine x 2)     Status: None (Preliminary result)   Collection Time: 12/13/19  9:43 PM   Specimen: BLOOD  Result Value Ref Range Status   Specimen Description BLOOD LEFT ANTECUBITAL  Final   Special Requests   Final    BOTTLES DRAWN AEROBIC AND ANAEROBIC Blood Culture adequate volume   Culture   Final    NO GROWTH 3 DAYS Performed at [facility], [address], [place], [place] [address]    Report Status PENDING  Incomplete  Blood Culture (routine x 2)     Status: None (Preliminary result)   Collection Time: 12/13/19  9:43 PM   Specimen: BLOOD  Result Value Ref Range Status   Specimen Description BLOOD RIGHT ARM  Final   Special Requests   Final    BOTTLES DRAWN AEROBIC AND ANAEROBIC Blood Culture adequate volume   Culture   Final    NO GROWTH 3 DAYS Performed at [facility], [place] [address], [place], [place] [address]    Report Status PENDING  Incomplete  Resp Panel by RT-PCR (Flu A&B, Covid) Nasopharyngeal Swab     Status: None   Collection Time: 12/13/19 10:09 PM   Specimen: Nasopharyngeal Swab; Nasopharyngeal(NP) swabs in vial transport medium  Result Value Ref Range Status   SARS Coronavirus 2 by RT PCR NEGATIVE NEGATIVE Final    Comment: (NOTE) SARS-CoV-2 target nucleic acids are NOT DETECTED.  The SARS-CoV-2 RNA is generally detectable in upper respiratory specimens during the acute phase of infection. The lowest concentration of SARS-CoV-2 viral copies this assay can detect is 138 copies/mL. A negative result does not preclude SARS-Cov-2 infection and should not be used as the sole basis for treatment or other patient management decisions. A negative result may occur with  improper specimen collection/handling, submission of specimen other than nasopharyngeal swab, presence of viral mutation(s) within the areas targeted by this assay,  and inadequate number of viral copies(<138 copies/mL). A negative result must be combined with clinical observations, patient history, and epidemiological information. The expected result is Negative.  Fact Sheet for Patients:  EntrepreneurPulse.com.au  Fact Sheet for Healthcare Providers:  IncredibleEmployment.be  This test is no t yet approved or cleared by the [place] FDA and  has been authorized for detection and/or diagnosis of SARS-CoV-2 by FDA under an Emergency Use Authorization (EUA). This EUA will remain  in effect (meaning this test can be used) for the duration of the COVID-19 declaration under Section 564(b)(1) of the Act, 21 U.S.C.section 360bbb-3(b)(1), unless the authorization is terminated  or revoked sooner.       Influenza A by PCR NEGATIVE NEGATIVE Final   Influenza B by PCR NEGATIVE NEGATIVE Final    Comment: (NOTE) The Xpert Xpress SARS-CoV-2/FLU/RSV plus assay is intended as an aid in the diagnosis of influenza from Nasopharyngeal swab specimens and should not be used as a sole basis for treatment. Nasal washings and aspirates are unacceptable for Xpert Xpress SARS-CoV-2/FLU/RSV testing.  Fact Sheet for Patients: EntrepreneurPulse.com.au  Fact Sheet for Healthcare Providers: IncredibleEmployment.be  This test is not yet approved or cleared by the [place] FDA and has been authorized for detection and/or diagnosis of SARS-CoV-2 by FDA under an Emergency Use Authorization (EUA). This EUA will remain in effect (meaning this test can be used) for the duration of the COVID-19 declaration under Section 564(b)(1) of the Act, 21 U.S.C. section 360bbb-3(b)(1), unless the authorization is terminated or revoked.  Performed at  [facility], [place] [address], [place], [place] [address]      Time coordinating discharge: 25 minutes  SIGNED: Antonieta Pert, MD  [facility] 12/18/2019, 9:26 AM  If 7PM-7AM, please contact night-coverage [email or web address]

## 2019-12-18 NOTE — Plan of Care (Signed)
  Problem: Education: Goal: Knowledge of General Education information will improve Description Including pain rating scale, medication(s)/side effects and non-pharmacologic comfort measures Outcome: Progressing   

## 2019-12-19 LAB — CULTURE, BLOOD (ROUTINE X 2)
Culture: NO GROWTH
Culture: NO GROWTH
Special Requests: ADEQUATE
Special Requests: ADEQUATE

## 2020-02-07 ENCOUNTER — Other Ambulatory Visit: Payer: Self-pay | Admitting: Internal Medicine

## 2020-02-07 DIAGNOSIS — S0990XS Unspecified injury of head, sequela: Secondary | ICD-10-CM

## 2020-02-26 ENCOUNTER — Ambulatory Visit
Admission: RE | Admit: 2020-02-26 | Discharge: 2020-02-26 | Disposition: A | Discharge status: Home / Self Care | Payer: Medicare Other | Source: Ambulatory Visit | Attending: Internal Medicine | Admitting: Internal Medicine

## 2020-02-26 ENCOUNTER — Other Ambulatory Visit: Payer: Self-pay

## 2020-02-26 DIAGNOSIS — S0990XS Unspecified injury of head, sequela: Secondary | ICD-10-CM | POA: Insufficient documentation

## 2020-05-02 ENCOUNTER — Emergency Department: Payer: Medicare Other

## 2020-05-02 ENCOUNTER — Other Ambulatory Visit: Payer: Self-pay

## 2020-05-02 ENCOUNTER — Emergency Department
Admission: EM | Admit: 2020-05-02 | Discharge: 2020-05-02 | Disposition: A | Discharge status: Home / Self Care | Payer: Medicare Other | Attending: Emergency Medicine | Admitting: Emergency Medicine

## 2020-05-02 DIAGNOSIS — I4891 Unspecified atrial fibrillation: Secondary | ICD-10-CM | POA: Insufficient documentation

## 2020-05-02 DIAGNOSIS — W01198A Fall on same level from slipping, tripping and stumbling with subsequent striking against other object, initial encounter: Secondary | ICD-10-CM | POA: Insufficient documentation

## 2020-05-02 DIAGNOSIS — Y92009 Unspecified place in unspecified non-institutional (private) residence as the place of occurrence of the external cause: Secondary | ICD-10-CM | POA: Diagnosis not present

## 2020-05-02 DIAGNOSIS — W19XXXA Unspecified fall, initial encounter: Secondary | ICD-10-CM

## 2020-05-02 DIAGNOSIS — F17290 Nicotine dependence, other tobacco product, uncomplicated: Secondary | ICD-10-CM | POA: Diagnosis not present

## 2020-05-02 DIAGNOSIS — E119 Type 2 diabetes mellitus without complications: Secondary | ICD-10-CM | POA: Insufficient documentation

## 2020-05-02 DIAGNOSIS — S0990XA Unspecified injury of head, initial encounter: Secondary | ICD-10-CM | POA: Diagnosis present

## 2020-05-02 DIAGNOSIS — S0101XA Laceration without foreign body of scalp, initial encounter: Secondary | ICD-10-CM | POA: Diagnosis not present

## 2020-05-02 DIAGNOSIS — Z7901 Long term (current) use of anticoagulants: Secondary | ICD-10-CM | POA: Diagnosis not present

## 2020-05-02 DIAGNOSIS — Z79899 Other long term (current) drug therapy: Secondary | ICD-10-CM | POA: Insufficient documentation

## 2020-05-02 DIAGNOSIS — Z7982 Long term (current) use of aspirin: Secondary | ICD-10-CM | POA: Diagnosis not present

## 2020-05-02 DIAGNOSIS — I10 Essential (primary) hypertension: Secondary | ICD-10-CM | POA: Insufficient documentation

## 2020-05-02 MED ORDER — OXYCODONE-ACETAMINOPHEN 5-325 MG PO TABS
1.0000 | ORAL_TABLET | Freq: Once | ORAL | Status: AC
  Administered 2020-05-02: 1 via ORAL
  Filled 2020-05-02: qty 1

## 2020-05-02 MED ORDER — BACITRACIN-NEOMYCIN-POLYMYXIN 400-5-5000 EX OINT: TOPICAL_OINTMENT | Freq: Once | CUTANEOUS | Status: AC

## 2020-05-02 MED ORDER — BACITRACIN-NEOMYCIN-POLYMYXIN 400-5-5000 EX OINT
TOPICAL_OINTMENT | CUTANEOUS | Status: AC
  Administered 2020-05-02: 1 via TOPICAL
  Filled 2020-05-02: qty 1

## 2020-05-02 MED ORDER — LIDOCAINE-EPINEPHRINE 2 %-1:100000 IJ SOLN
30.0000 mL | Freq: Once | INTRAMUSCULAR | Status: AC
  Administered 2020-05-02: 30 mL
  Filled 2020-05-02: qty 2

## 2020-05-02 NOTE — ED Notes (Signed)
Wife at bedside. Wife and patient updated on POC. Patient head injury noted to have increased bleeding. Bleeding controlled by dressing.

## 2020-05-02 NOTE — ED Provider Notes (Signed)
..  Laceration Repair  Date/Time: 05/02/2020 5:30 PM Performed by: Blake Divine, MD Authorized by: Blake Divine, MD   Consent:    Consent obtained:  Verbal   Consent given by:  Patient   Risks, benefits, and alternatives were discussed: yes     Risks discussed:  Infection, pain, retained foreign body, tendon damage, vascular damage, poor cosmetic result, poor wound healing, need for additional repair and nerve damage   Alternatives discussed:  No treatment Universal protocol:    Patient identity confirmed:  Verbally with patient and arm band Anesthesia:    Anesthesia method:  Local infiltration   Local anesthetic:  Lidocaine 2% WITH epi Laceration details:    Location:  Scalp   Scalp location:  Occipital   Length (cm):  15 Pre-procedure details:    Preparation:  Patient was prepped and draped in usual sterile fashion and imaging obtained to evaluate for foreign bodies Exploration:    Limited defect created (wound extended): no     Hemostasis achieved with:  Epinephrine and direct pressure   Imaging obtained comment:  CT   Imaging outcome: foreign body not noted     Wound exploration: wound explored through full range of motion and entire depth of wound visualized     Wound extent: no foreign bodies/material noted, no muscle damage noted, no nerve damage noted, no tendon damage noted, no underlying fracture noted and no vascular damage noted     Contaminated: no   Treatment:    Area cleansed with:  Saline   Amount of cleaning:  Standard   Irrigation solution:  Sterile saline   Irrigation method:  Pressure wash and syringe   Visualized foreign bodies/material removed: no     Debridement:  None   Undermining:  None   Scar revision: no   Skin repair:    Repair method:  Staples and sutures   Suture size:  3-0   Suture material:  Nylon   Number of sutures:  1 (Figure of eight)   Number of staples:  17 Approximation:    Approximation:  Close Repair type:    Repair type:   Intermediate Post-procedure details:    Dressing:  Non-adherent dressing and tube gauze   Procedure completion:  Tolerated well, no immediate complications Comments:     Patient with one area of arteriolar bleeding requiring figure of eight stitch.   ----------------------------------------- 5:59 PM on 05/02/2020 -----------------------------------------  Patient evaluated in conjunction with PA Menshew, please see additional note for further documentation.  Patient with mechanical fall backwards striking his head, now with large occipital laceration and significant bleeding, he does take Eliquis.  CT head and C-spine are negative for acute process.  Laceration was repaired with staples but patient had ongoing arteriolar bleeding from left side of the laceration requiring placement of figure-of-eight stitch.  Pressure was held with quick clot after which bleeding stopped.  He is appropriate for discharge home with PCP follow-up for suture removal in 7 to 10 days.   Blake Divine, MD 05/02/20 [phone]

## 2020-05-02 NOTE — ED Notes (Signed)
This RN to bedside to provide patient pain medication (see MAR). Patient noted to have more significant bleeding to head wound. Blood noted to be pooling around the patient's head. Dr. Charna Archer at bedside.

## 2020-05-02 NOTE — ED Triage Notes (Signed)
Patient presents to the ED with EMS. EMS report a fall about 1 hour ago. Patient reports he was working in his wood shop, when he tripped over his feet, stumbling, and hitting his head on some type of metal object. Patient denies LOC. Patient does report he is on blood thinners. Patient arrives to ED alert, oriented x4, with complaints of a headache.

## 2020-05-02 NOTE — ED Notes (Signed)
Patient transported to CT 

## 2020-05-02 NOTE — ED Provider Notes (Incomplete)
[facility] Emergency Department Provider Note  {** REMINDER - THIS NOTE IS NOT A FINAL MEDICAL RECORD UNTIL IT IS SIGNED.  UNTIL THEN, THE CONTENT BELOW MAY REFLECT INFORMATION FROM A DOCUMENTATION TEMPLATE, NOT THE ACTUAL PATIENT VISIT. **} ____________________________________________   None    (approximate)  I have reviewed the triage vital signs and the nursing notes.   HISTORY  Chief Complaint Fall  {**Delete this block, or insert here any limitations to your history or physical exam, such as chronic dementia, altered mental status, severe respiratory distress, intoxication, etc.**}  HPI William Keith is a 80 y.o. male ***        {**SYMPTOM/COMPLAINT  LOCATION (describe anatomically) DURATION (when did it start) TIMING (onset and pattern) SEVERITY (0-10, mild/moderate/severe) QUALITY (description of symptoms) CONTEXT (recent surgery, new meds, activity, etc.) MODIFYINGFACTORS (what makes it better/worse) ASSOCIATEDSYMPTOMS (pertinent positives and negatives)**} Past Medical History:  Diagnosis Date  . Atrial fibrillation ([place])   . Diabetes mellitus without complication ([place])   . Dysrhythmia   . Hypercholesteremia   . Hypertension   . Neck pain   . Stroke [facility])     Patient Active Problem List   Diagnosis Date Noted  . Leukocytosis 12/14/2019  . Fever 12/14/2019  . Headache 12/14/2019  . Generalized weakness 12/14/2019  . Sepsis ([place]) 09/27/2018    Past Surgical History:  Procedure Laterality Date  . NO PAST SURGERIES      Prior to Admission medications   Medication Sig Start Date End Date Taking? Authorizing Provider  acetaminophen (TYLENOL) 325 MG tablet Take 650 mg by mouth every 6 (six) hours as needed for mild pain, fever or headache.    [provider]  albuterol (VENTOLIN HFA) 108 (90 Base) MCG/ACT inhaler Inhale 1 puff into the lungs every 4 (four) hours as needed for wheezing or shortness of breath.     [provider]  ARIPiprazole (ABILIFY) 5 MG tablet Take 1 tablet by mouth daily. 12/02/19   [provider]  aspirin EC 81 MG tablet Take 81 mg by mouth daily.    [provider]  atenolol (TENORMIN) 50 MG tablet Take 50 mg by mouth daily. 11/06/19   [provider]  atorvastatin (LIPITOR) 80 MG tablet Take 80 mg by mouth daily. 08/01/18   [provider]  buPROPion (WELLBUTRIN SR) 150 MG 12 hr tablet Take 150 mg by mouth daily. 07/08/18   [provider]  Cholecalciferol (D2000 ULTRA STRENGTH) 50 MCG (2000 UT) CAPS Take 2,000 Units by mouth daily.    [provider]  DULoxetine (CYMBALTA) 60 MG capsule Take 2 capsules by mouth daily. 10/31/19   [provider]  ELIQUIS 5 MG TABS tablet Take 5 mg by mouth 2 (two) times daily. 07/11/18   [provider]  fluticasone (FLONASE) 50 MCG/ACT nasal spray Place 2 sprays into the nose 2 (two) times daily as needed for rhinitis. 04/13/18 12/13/19  [provider]  gabapentin (NEURONTIN) 100 MG capsule Take 1 capsule (100 mg total) by mouth 3 (three) times daily for 10 days. 12/18/19 12/28/19  Antonieta Pert, MD  losartan (COZAAR) 50 MG tablet Take 25 mg by mouth daily. 08/13/18   [provider]  Multiple Vitamins-Minerals (MULTIVITAMIN WITH MINERALS) tablet Take 1 tablet by mouth at bedtime.    [provider]  nitroGLYCERIN (NITROLINGUAL) 0.4 MG/SPRAY spray Place 1 spray under the tongue every 5 (five) minutes x 3 doses as needed for chest pain.  [provider]  omeprazole (PRILOSEC) 40 MG capsule Take 40 mg by mouth daily. 09/06/18   [provider]  oxyCODONE-acetaminophen (PERCOCET/ROXICET) 5-325 MG tablet Take 1 tablet by mouth every 6 (six) hours as needed for moderate pain.    [provider]  tamsulosin (FLOMAX) 0.4 MG CAPS capsule Take 0.4 mg by mouth at bedtime.    [provider]  topiramate (TOPAMAX) 50 MG tablet Take  50 mg by mouth at bedtime. 06/18/18   [provider]    Allergies Dilaudid [hydromorphone hcl]  History reviewed. No pertinent family history.  Social History Social History   Tobacco Use  . Smoking status: Current Every Day Smoker    Types: Pipe  . Smokeless tobacco: Never Used  Substance Use Topics  . Alcohol use: Not Currently    Review of Systems {** Revise as appropriate then delete this line - Documentation of 10 systems is required  **} Constitutional: No fever/chills Eyes: No visual changes. ENT: No sore throat. Cardiovascular: Denies chest pain. Respiratory: Denies shortness of breath. Gastrointestinal: No abdominal pain.  No nausea, no vomiting.  No diarrhea.  No constipation. Genitourinary: Negative for dysuria. Musculoskeletal: Negative for back pain. Skin: Negative for rash. Neurological: Negative for headaches, focal weakness or numbness. {**Psychiatric:  Endocrine:  Hematological/Lymphatic:  Allergic/Immunilogical: **}  ____________________________________________   PHYSICAL EXAM:  VITAL SIGNS: ED Triage Vitals  Enc Vitals Group     BP 05/02/20 1511 (!) 147/64     Pulse Rate 05/02/20 1511 75     Resp 05/02/20 1511 18     Temp 05/02/20 1511 97.6 F (36.4 C)     Temp Source 05/02/20 1511 Oral     SpO2 05/02/20 1511 95 %     Weight 05/02/20 1504 231 lb 7.7 oz (105 kg)     Height 05/02/20 1504 5\' 8"  (1.727 m)     Head Circumference --      Peak Flow --      Pain Score 05/02/20 1503 7     Pain Loc --      Pain Edu? --      Excl. in [place]? --    {** Revise as appropriate then delete this line - 8 systems required **} Constitutional: Alert and oriented. Well appearing and in no acute distress. Eyes: Conjunctivae are normal. PERRL. EOMI. Head: Atraumatic. Nose: No congestion/rhinnorhea. Mouth/Throat: Mucous membranes are moist.  Oropharynx non-erythematous. Neck: No stridor.  {**No cervical spine tenderness to  palpation.**} {**Hematological/Lymphatic/Immunilogical: No cervical lymphadenopathy. **}Cardiovascular: Normal rate, regular rhythm. Grossly normal heart sounds.  Good peripheral circulation. Respiratory: Normal respiratory effort.  No retractions. Lungs CTAB. Gastrointestinal: Soft and nontender. No distention. No abdominal bruits. No CVA tenderness. {**Genitourinary:  **}Musculoskeletal: No lower extremity tenderness nor edema.  No joint effusions. Neurologic:  Normal speech and language. No gross focal neurologic deficits are appreciated. No gait instability. Skin:  Skin is warm, dry and intact. No rash noted. Psychiatric: Mood and affect are normal. Speech and behavior are normal.  ____________________________________________   LABS (all labs ordered are listed, but only abnormal results are displayed)  Labs Reviewed - No data to display ____________________________________________  EKG  *** ____________________________________________  RADIOLOGY I, Melvenia Needles, personally viewed and evaluated these images (plain radiographs) as part of my medical decision making, as well as reviewing the written report by the radiologist.  ED MD interpretation:  ***  Official radiology report(s): CT Head Wo Contrast  Result Date: 05/02/2020 CLINICAL DATA:  [place] 1  hour ago, hit head, headache EXAM: CT HEAD WITHOUT CONTRAST CT CERVICAL SPINE WITHOUT CONTRAST TECHNIQUE: Multidetector CT imaging of the head and cervical spine was performed following the standard protocol without intravenous contrast. Multiplanar CT image reconstructions of the cervical spine were also generated. COMPARISON:  02/26/2020 FINDINGS: CT HEAD FINDINGS Brain: No acute infarct or hemorrhage. Stable hypodensities in the periventricular white matter consistent with chronic small vessel ischemic changes. 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 Lateral ventricles and midline structures are  unremarkable. No acute extra-axial fluid collections. No mass effect. Vascular: No hyperdense vessel or unexpected calcification. Skull: Left parietal scalp laceration. No underlying fracture. The remainder of the calvarium is unremarkable. Sinuses/Orbits: No acute finding. Other: None. CT CERVICAL SPINE FINDINGS Alignment: Extensive postsurgical changes are seen throughout the cervical spine with multilevel fusion, with resulting mild kyphosis at the cervicothoracic junction. Otherwise alignment is anatomic. Skull base and vertebrae: No acute fracture. No primary bone lesion or focal pathologic process. Soft tissues and spinal canal: No prevertebral fluid or swelling. No visible canal hematoma. Disc levels: Prior posterior fusion spanning C2 through C6. Anterior fusion plate and intra corporal screws spanning C3 through C5. There is bony fusion at C5-6 and C6-7 as well. Mild spondylosis at C2-3. Upper chest: Airway is patent.  Lung apices are clear. Other: Reconstructed images demonstrate no additional findings. IMPRESSION: 1. Left parietal scalp laceration. Otherwise no acute intracranial process. 2. No acute cervical spine fracture. Electronically Signed   By: Randa Ngo M.D.   On: 05/02/2020 16:12   CT Cervical Spine Wo Contrast  Result Date: 05/02/2020 CLINICAL DATA:  [place] 1 hour ago, hit head, headache EXAM: CT HEAD WITHOUT CONTRAST CT CERVICAL SPINE WITHOUT CONTRAST TECHNIQUE: Multidetector CT imaging of the head and cervical spine was performed following the standard protocol without intravenous contrast. Multiplanar CT image reconstructions of the cervical spine were also generated. COMPARISON:  02/26/2020 FINDINGS: CT HEAD FINDINGS Brain: No acute infarct or hemorrhage. Stable hypodensities in the periventricular white matter consistent with chronic small vessel ischemic changes. 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 Lateral ventricles and midline structures are  unremarkable. No acute extra-axial fluid collections. No mass effect. Vascular: No hyperdense vessel or unexpected calcification. Skull: Left parietal scalp laceration. No underlying fracture. The remainder of the calvarium is unremarkable. Sinuses/Orbits: No acute finding. Other: None. CT CERVICAL SPINE FINDINGS Alignment: Extensive postsurgical changes are seen throughout the cervical spine with multilevel fusion, with resulting mild kyphosis at the cervicothoracic junction. Otherwise alignment is anatomic. Skull base and vertebrae: No acute fracture. No primary bone lesion or focal pathologic process. Soft tissues and spinal canal: No prevertebral fluid or swelling. No visible canal hematoma. Disc levels: Prior posterior fusion spanning C2 through C6. Anterior fusion plate and intra corporal screws spanning C3 through C5. There is bony fusion at C5-6 and C6-7 as well. Mild spondylosis at C2-3. Upper chest: Airway is patent.  Lung apices are clear. Other: Reconstructed images demonstrate no additional findings. IMPRESSION: 1. Left parietal scalp laceration. Otherwise no acute intracranial process. 2. No acute cervical spine fracture. Electronically Signed   By: Randa Ngo M.D.   On: 05/02/2020 16:12    ____________________________________________   PROCEDURES  Procedure(s) performed (including Critical Care):  Procedures   ____________________________________________   INITIAL IMPRESSION / ASSESSMENT AND PLAN / ED COURSE  As part of my medical decision making, I reviewed the following data within the electronic MEDICAL RECORD NUMBER {[id number]::"Notes from prior ED visits"," Controlled Substance Database"}        ***  ____________________________________________   FINAL CLINICAL IMPRESSION(S) / ED DIAGNOSES  Final diagnoses:  Fall in home, initial encounter  Minor head injury, initial encounter  Laceration of scalp, initial encounter     ED Discharge Orders    None       *Please note:  Dillian Feig was evaluated in Emergency Department on 05/02/2020 for the symptoms described in the history of present illness. He was evaluated in the context of the global COVID-19 pandemic, which necessitated consideration that the patient might be at risk for infection with the SARS-CoV-2 virus that causes COVID-19. Institutional protocols and algorithms that pertain to the evaluation of patients at risk for COVID-19 are in a state of rapid change based on information released by regulatory bodies including the CDC and federal and state organizations. These policies and algorithms were followed during the patient's care in the ED.  Some ED evaluations and interventions may be delayed as a result of limited staffing during and the pandemic.*   Note:  This document was prepared using Dragon voice recognition software and may include unintentional dictation errors.

## 2020-05-02 NOTE — ED Notes (Signed)
Patient cleansed with wipes. Dried blood removed. Patient repositioned in bed.

## 2020-05-02 NOTE — ED Notes (Signed)
Patient reports falling in his shop while [place]. Patient noted to have avulsion to scalp. No active bleeding noted. + blood thinners.

## 2020-05-02 NOTE — ED Notes (Signed)
ED Provider at bedside. 

## 2020-05-02 NOTE — ED Notes (Signed)
Patient returned to ED from CT.

## 2020-05-02 NOTE — ED Provider Notes (Signed)
[facility] Emergency Department Provider Note   ____________________________________________   Event Date/Time   First MD Initiated Contact with Patient 05/02/20 1815     (approximate)  I have reviewed the triage vital signs and the nursing notes.   HISTORY  Chief Complaint Fall    HPI William Keith is a 80 y.o. male with past medical history of hypertension, hyperlipidemia, diabetes, and atrial fibrillation on Eliquis who presents to the ED following fall.  Patient states that he was working in his wood shop this afternoon when he suddenly lost his balance and fell backwards, striking his head.  He is unsure what he hit his head on but he immediately noticed significant bleeding from the back of his head.  He denies any loss of consciousness but does report significant pain to the back of his head as well as his neck.  He denies any pain in his chest, abdomen, back, or extremities.  He denies any vision changes, speech changes, numbness, or weakness.        Past Medical History:  Diagnosis Date  . Atrial fibrillation ([place])   . Diabetes mellitus without complication ([place])   . Dysrhythmia   . Hypercholesteremia   . Hypertension   . Neck pain   . Stroke [facility])     Patient Active Problem List   Diagnosis Date Noted  . Leukocytosis 12/14/2019  . Fever 12/14/2019  . Headache 12/14/2019  . Generalized weakness 12/14/2019  . Sepsis ([place]) 09/27/2018    Past Surgical History:  Procedure Laterality Date  . NO PAST SURGERIES      Prior to Admission medications   Medication Sig Start Date End Date Taking? Authorizing Provider  acetaminophen (TYLENOL) 325 MG tablet Take 650 mg by mouth every 6 (six) hours as needed for mild pain, fever or headache.    [provider]  albuterol (VENTOLIN HFA) 108 (90 Base) MCG/ACT inhaler Inhale 1 puff into the lungs every 4 (four) hours as needed for wheezing or shortness of breath.    [provider]  ARIPiprazole (ABILIFY) 5 MG tablet Take 1 tablet by mouth daily. 12/02/19   [provider]  aspirin EC 81 MG tablet Take 81 mg by mouth daily.    [provider]  atenolol (TENORMIN) 50 MG tablet Take 50 mg by mouth daily. 11/06/19   [provider]  atorvastatin (LIPITOR) 80 MG tablet Take 80 mg by mouth daily. 08/01/18   [provider]  buPROPion (WELLBUTRIN SR) 150 MG 12 hr tablet Take 150 mg by mouth daily. 07/08/18   [provider]  Cholecalciferol (D2000 ULTRA STRENGTH) 50 MCG (2000 UT) CAPS Take 2,000 Units by mouth daily.    [provider]  DULoxetine (CYMBALTA) 60 MG capsule Take 2 capsules by mouth daily. 10/31/19   [provider]  ELIQUIS 5 MG TABS tablet Take 5 mg by mouth 2 (two) times daily. 07/11/18   [provider]  fluticasone (FLONASE) 50 MCG/ACT nasal spray Place 2 sprays into the nose 2 (two) times daily as needed for rhinitis. 04/13/18 12/13/19  [provider]  gabapentin (NEURONTIN) 100 MG capsule Take 1 capsule (100 mg total) by mouth 3 (three) times daily for 10 days. 12/18/19 12/28/19  Antonieta Pert, MD  losartan (COZAAR) 50 MG tablet Take 25 mg by mouth daily. 08/13/18   [provider]  Multiple Vitamins-Minerals (MULTIVITAMIN WITH MINERALS) tablet Take 1 tablet by mouth at bedtime.    [provider]  nitroGLYCERIN (NITROLINGUAL) 0.4 MG/SPRAY spray Place 1 spray under the tongue every 5 (five) minutes x 3 doses as needed for chest pain.    [provider]  omeprazole (PRILOSEC) 40 MG capsule Take 40 mg by mouth daily. 09/06/18   [provider]  oxyCODONE-acetaminophen (PERCOCET/ROXICET) 5-325 MG tablet Take 1 tablet by mouth every 6 (six) hours as needed for moderate pain.    [provider]  tamsulosin (FLOMAX) 0.4 MG CAPS capsule Take 0.4 mg by mouth at bedtime.    [provider]  topiramate (TOPAMAX) 50 MG tablet Take 50 mg by  mouth at bedtime. 06/18/18   [provider]    Allergies Dilaudid [hydromorphone hcl]  History reviewed. No pertinent family history.  Social History Social History   Tobacco Use  . Smoking status: Current Every Day Smoker    Types: Pipe  . Smokeless tobacco: Never Used  Substance Use Topics  . Alcohol use: Not Currently    Review of Systems  Constitutional: No fever/chills Eyes: No visual changes. ENT: No sore throat. Cardiovascular: Denies chest pain. Respiratory: Denies shortness of breath. Gastrointestinal: No abdominal pain.  No nausea, no vomiting.  No diarrhea.  No constipation. Genitourinary: Negative for dysuria. Musculoskeletal: Negative for back pain.  Positive for neck pain. Skin: Negative for rash. Neurological: Positive for headaches, negative for focal weakness or numbness.  ____________________________________________   PHYSICAL EXAM:  VITAL SIGNS: ED Triage Vitals  Enc Vitals Group     BP 05/02/20 1511 (!) 147/64     Pulse Rate 05/02/20 1511 75     Resp 05/02/20 1511 18     Temp 05/02/20 1511 97.6 F (36.4 C)     Temp Source 05/02/20 1511 Oral     SpO2 05/02/20 1511 95 %     Weight 05/02/20 1504 231 lb 7.7 oz (105 kg)     Height 05/02/20 1504 5\' 8"  (1.727 m)     Head Circumference --      Peak Flow --      Pain Score 05/02/20 1503 7     Pain Loc --      Pain Edu? --      Excl. in [place]? --     Constitutional: Alert and oriented. Eyes: Conjunctivae are normal. Head: Reticular shaped laceration to posterior scalp with ongoing bleeding.  Area of pulsatile bleeding noted to left side of laceration. Nose: No congestion/rhinnorhea. Mouth/Throat: Mucous membranes are moist. Neck: Normal ROM, midline cervical spine tenderness noted. Cardiovascular: Normal rate, regular rhythm. Grossly normal heart sounds. Respiratory: Normal respiratory effort.  No retractions. Lungs CTAB. Gastrointestinal: Soft and nontender. No  distention. Genitourinary: deferred Musculoskeletal: No lower extremity tenderness nor edema. Neurologic:  Normal speech and language. No gross focal neurologic deficits are appreciated. Skin:  Skin is warm, dry and intact. No rash noted. Psychiatric: Mood and affect are normal. Speech and behavior are normal.  ____________________________________________   LABS (all labs ordered are listed, but only abnormal results are displayed)  Labs Reviewed - No data to display   PROCEDURES  Procedure(s) performed (including Critical Care):  Procedures  .Marland KitchenLaceration Repair  Date/Time: 05/02/2020 5:30 PM Performed by: Blake Divine, MD Authorized by: Blake Divine, MD   Consent:    Consent obtained:  Verbal   Consent given by:  Patient   Risks, benefits, and alternatives were discussed: yes     Risks discussed:  Infection, pain, retained foreign body, tendon damage, vascular damage, poor cosmetic result, poor wound healing,  need for additional repair and nerve damage   Alternatives discussed:  No treatment Universal protocol:    Patient identity confirmed:  Verbally with patient and arm band Anesthesia:    Anesthesia method:  Local infiltration   Local anesthetic:  Lidocaine 2% WITH epi Laceration details:    Location:  Scalp   Scalp location:  Occipital   Length (cm):  15 Pre-procedure details:    Preparation:  Patient was prepped and draped in usual sterile fashion and imaging obtained to evaluate for foreign bodies Exploration:    Limited defect created (wound extended): no     Hemostasis achieved with:  Epinephrine and direct pressure   Imaging obtained comment:  CT   Imaging outcome: foreign body not noted     Wound exploration: wound explored through full range of motion and entire depth of wound visualized     Wound extent: no foreign bodies/material noted, no muscle damage noted, no nerve damage noted, no tendon damage noted, no underlying fracture noted and no  vascular damage noted     Contaminated: no   Treatment:    Area cleansed with:  Saline   Amount of cleaning:  Standard   Irrigation solution:  Sterile saline   Irrigation method:  Pressure wash and syringe   Visualized foreign bodies/material removed: no     Debridement:  None   Undermining:  None   Scar revision: no   Skin repair:    Repair method:  Staples and sutures   Suture size:  3-0   Suture material:  Nylon   Number of sutures:  1 (Figure of eight)   Number of staples:  17 Approximation:    Approximation:  Close Repair type:    Repair type:  Intermediate Post-procedure details:    Dressing:  Non-adherent dressing and tube gauze   Procedure completion:  Tolerated well, no immediate complications Comments:     Patient with one area of arteriolar bleeding requiring figure of eight stitch. ____________________________________________   INITIAL IMPRESSION / ASSESSMENT AND PLAN / ED COURSE       80 year old male with past medical history of hypertension, hyperlipidemia, diabetes, and atrial fibrillation on Eliquis who presents to the ED complaining of head injury.  Patient fell backwards and hit his head, now has significant laceration to the occipital portion of his scalp with small area of arteriolar bleeding from the left side of the laceration.  CT head reviewed by me and shows no obvious hemorrhage, negative for acute process per radiology.  CT cervical spine is also negative for acute process.  Wound was anesthetized with lidocaine containing epinephrine, subsequently repaired with staples.  Patient had ongoing arteriolar bleeding from the left side of the laceration requiring figure-of-eight stitch, after which pressure was held with quick clot dressing.  Patient with no further bleeding at this time and antibiotic ointment was applied to the wound.  He is appropriate for discharge home with PCP follow-up and suture removal in 7 to 10 days.  Patient and wife agree with  plan.      ____________________________________________   FINAL CLINICAL IMPRESSION(S) / ED DIAGNOSES  Final diagnoses:  Fall in home, initial encounter  Minor head injury, initial encounter  Laceration of scalp, initial encounter     ED Discharge Orders    None       Note:  This document was prepared using Dragon voice recognition software and may include unintentional dictation errors.   Blake Divine, MD 05/02/20 [phone]

## 2020-05-02 NOTE — ED Notes (Signed)
Blood cleansed from the patient's head and back. Gown applied since patient's shirt was removed.

## 2020-05-10 ENCOUNTER — Other Ambulatory Visit: Payer: Self-pay

## 2020-05-10 ENCOUNTER — Emergency Department: Payer: Medicare Other

## 2020-05-10 ENCOUNTER — Encounter: Payer: Self-pay | Admitting: Internal Medicine

## 2020-05-10 ENCOUNTER — Observation Stay
Admission: EM | Admit: 2020-05-10 | Discharge: 2020-05-11 | Disposition: A | Discharge status: Home / Self Care | Payer: Medicare Other | Attending: Internal Medicine | Admitting: Internal Medicine

## 2020-05-10 DIAGNOSIS — D500 Iron deficiency anemia secondary to blood loss (chronic): Secondary | ICD-10-CM | POA: Diagnosis present

## 2020-05-10 DIAGNOSIS — Z7901 Long term (current) use of anticoagulants: Secondary | ICD-10-CM | POA: Diagnosis not present

## 2020-05-10 DIAGNOSIS — Z79899 Other long term (current) drug therapy: Secondary | ICD-10-CM | POA: Diagnosis not present

## 2020-05-10 DIAGNOSIS — W19XXXA Unspecified fall, initial encounter: Secondary | ICD-10-CM | POA: Diagnosis present

## 2020-05-10 DIAGNOSIS — I4891 Unspecified atrial fibrillation: Secondary | ICD-10-CM | POA: Diagnosis not present

## 2020-05-10 DIAGNOSIS — N400 Enlarged prostate without lower urinary tract symptoms: Secondary | ICD-10-CM | POA: Diagnosis present

## 2020-05-10 DIAGNOSIS — R55 Syncope and collapse: Secondary | ICD-10-CM | POA: Diagnosis not present

## 2020-05-10 DIAGNOSIS — F17200 Nicotine dependence, unspecified, uncomplicated: Secondary | ICD-10-CM | POA: Insufficient documentation

## 2020-05-10 DIAGNOSIS — S0101XA Laceration without foreign body of scalp, initial encounter: Principal | ICD-10-CM | POA: Insufficient documentation

## 2020-05-10 DIAGNOSIS — T148XXA Other injury of unspecified body region, initial encounter: Secondary | ICD-10-CM

## 2020-05-10 DIAGNOSIS — E119 Type 2 diabetes mellitus without complications: Secondary | ICD-10-CM

## 2020-05-10 DIAGNOSIS — Z720 Tobacco use: Secondary | ICD-10-CM | POA: Diagnosis present

## 2020-05-10 DIAGNOSIS — Z7982 Long term (current) use of aspirin: Secondary | ICD-10-CM | POA: Insufficient documentation

## 2020-05-10 DIAGNOSIS — T8189XA Other complications of procedures, not elsewhere classified, initial encounter: Secondary | ICD-10-CM | POA: Diagnosis present

## 2020-05-10 DIAGNOSIS — E7800 Pure hypercholesterolemia, unspecified: Secondary | ICD-10-CM | POA: Diagnosis present

## 2020-05-10 DIAGNOSIS — Z8673 Personal history of transient ischemic attack (TIA), and cerebral infarction without residual deficits: Secondary | ICD-10-CM | POA: Insufficient documentation

## 2020-05-10 DIAGNOSIS — I10 Essential (primary) hypertension: Secondary | ICD-10-CM | POA: Diagnosis not present

## 2020-05-10 DIAGNOSIS — I700 Atherosclerosis of aorta: Secondary | ICD-10-CM | POA: Diagnosis not present

## 2020-05-10 DIAGNOSIS — R58 Hemorrhage, not elsewhere classified: Secondary | ICD-10-CM | POA: Diagnosis present

## 2020-05-10 DIAGNOSIS — Z23 Encounter for immunization: Secondary | ICD-10-CM | POA: Insufficient documentation

## 2020-05-10 DIAGNOSIS — M542 Cervicalgia: Secondary | ICD-10-CM | POA: Diagnosis present

## 2020-05-10 DIAGNOSIS — R42 Dizziness and giddiness: Secondary | ICD-10-CM

## 2020-05-10 DIAGNOSIS — F32A Depression, unspecified: Secondary | ICD-10-CM | POA: Diagnosis present

## 2020-05-10 DIAGNOSIS — D72829 Elevated white blood cell count, unspecified: Secondary | ICD-10-CM | POA: Diagnosis present

## 2020-05-10 DIAGNOSIS — Z20822 Contact with and (suspected) exposure to covid-19: Secondary | ICD-10-CM | POA: Diagnosis not present

## 2020-05-10 DIAGNOSIS — I639 Cerebral infarction, unspecified: Secondary | ICD-10-CM | POA: Diagnosis present

## 2020-05-10 DIAGNOSIS — I480 Paroxysmal atrial fibrillation: Secondary | ICD-10-CM | POA: Diagnosis not present

## 2020-05-10 LAB — PROTIME-INR
INR: 1.1 (ref 0.8–1.2)
Prothrombin Time: 14.6 seconds (ref 11.4–15.2)

## 2020-05-10 LAB — CBC
HCT: 32 % — ABNORMAL LOW (ref 39.0–52.0)
HCT: 31.4 % — ABNORMAL LOW (ref 39.0–52.0)
Hemoglobin: 10.3 g/dL — ABNORMAL LOW (ref 13.0–17.0)
Hemoglobin: 9.9 g/dL — ABNORMAL LOW (ref 13.0–17.0)
MCH: 28.4 pg (ref 26.0–34.0)
MCH: 28.1 pg (ref 26.0–34.0)
MCHC: 32.2 g/dL (ref 30.0–36.0)
MCHC: 31.5 g/dL (ref 30.0–36.0)
MCV: 88.2 fL (ref 80.0–100.0)
MCV: 89.2 fL (ref 80.0–100.0)
Platelets: 216 10*3/uL (ref 150–400)
Platelets: 221 10*3/uL (ref 150–400)
RBC: 3.63 MIL/uL — ABNORMAL LOW (ref 4.22–5.81)
RBC: 3.52 MIL/uL — ABNORMAL LOW (ref 4.22–5.81)
RDW: 14.5 % (ref 11.5–15.5)
RDW: 14.6 % (ref 11.5–15.5)
WBC: 13.4 10*3/uL — ABNORMAL HIGH (ref 4.0–10.5)
WBC: 14.4 10*3/uL — ABNORMAL HIGH (ref 4.0–10.5)
nRBC: 0 % (ref 0.0–0.2)
nRBC: 0 % (ref 0.0–0.2)

## 2020-05-10 LAB — CBC WITH DIFFERENTIAL/PLATELET
Abs Immature Granulocytes: 0.07 10*3/uL (ref 0.00–0.07)
Basophils Absolute: 0.1 10*3/uL (ref 0.0–0.1)
Basophils Relative: 1 %
Eosinophils Absolute: 0.2 10*3/uL (ref 0.0–0.5)
Eosinophils Relative: 1 %
HCT: 36 % — ABNORMAL LOW (ref 39.0–52.0)
Hemoglobin: 11.6 g/dL — ABNORMAL LOW (ref 13.0–17.0)
Immature Granulocytes: 1 %
Lymphocytes Relative: 24 %
Lymphs Abs: 3.4 10*3/uL (ref 0.7–4.0)
MCH: 28.7 pg (ref 26.0–34.0)
MCHC: 32.2 g/dL (ref 30.0–36.0)
MCV: 89.1 fL (ref 80.0–100.0)
Monocytes Absolute: 1.5 10*3/uL — ABNORMAL HIGH (ref 0.1–1.0)
Monocytes Relative: 10 %
Neutro Abs: 8.9 10*3/uL — ABNORMAL HIGH (ref 1.7–7.7)
Neutrophils Relative %: 63 %
Platelets: 256 10*3/uL (ref 150–400)
RBC: 4.04 MIL/uL — ABNORMAL LOW (ref 4.22–5.81)
RDW: 14.6 % (ref 11.5–15.5)
WBC: 14.1 10*3/uL — ABNORMAL HIGH (ref 4.0–10.5)
nRBC: 0 % (ref 0.0–0.2)

## 2020-05-10 LAB — TYPE AND SCREEN
ABO/RH(D): O POS
Antibody Screen: NEGATIVE

## 2020-05-10 LAB — COMPREHENSIVE METABOLIC PANEL
ALT: 18 U/L (ref 0–44)
AST: 19 U/L (ref 15–41)
Albumin: 3 g/dL — ABNORMAL LOW (ref 3.5–5.0)
Alkaline Phosphatase: 79 U/L (ref 38–126)
Anion gap: 7 (ref 5–15)
BUN: 13 mg/dL (ref 8–23)
CO2: 21 mmol/L — ABNORMAL LOW (ref 22–32)
Calcium: 8.4 mg/dL — ABNORMAL LOW (ref 8.9–10.3)
Chloride: 102 mmol/L (ref 98–111)
Creatinine, Ser: 0.87 mg/dL (ref 0.61–1.24)
GFR, Estimated: >60 mL/min (ref >60)
Glucose, Bld: 162 mg/dL — ABNORMAL HIGH (ref 70–99)
Potassium: 4.5 mmol/L (ref 3.5–5.1)
Sodium: 130 mmol/L — ABNORMAL LOW (ref 135–145)
Total Bilirubin: 0.9 mg/dL (ref 0.3–1.2)
Total Protein: 5.5 g/dL — ABNORMAL LOW (ref 6.5–8.1)

## 2020-05-10 LAB — TROPONIN I (HIGH SENSITIVITY)
Troponin I (High Sensitivity): 3 ng/L (ref <18)
Troponin I (High Sensitivity): 3 ng/L (ref <18)

## 2020-05-10 LAB — APTT: aPTT: 28 seconds (ref 24–36)

## 2020-05-10 LAB — RESP PANEL BY RT-PCR (FLU A&B, COVID) ARPGX2
Influenza A by PCR: NEGATIVE
Influenza B by PCR: NEGATIVE
SARS Coronavirus 2 by RT PCR: NEGATIVE

## 2020-05-10 MED ORDER — SODIUM CHLORIDE 0.9 % IV BOLUS
500.0000 mL | Freq: Once | INTRAVENOUS | Status: AC
  Administered 2020-05-10: 500 mL via INTRAVENOUS

## 2020-05-10 MED ORDER — LOSARTAN POTASSIUM 25 MG PO TABS
25.0000 mg | ORAL_TABLET | Freq: Every day | ORAL | Status: DC
  Administered 2020-05-11: 25 mg via ORAL
  Filled 2020-05-10: qty 1

## 2020-05-10 MED ORDER — TOPIRAMATE 25 MG PO TABS
50.0000 mg | ORAL_TABLET | Freq: Every day | ORAL | Status: DC
  Administered 2020-05-10: 20:00:00 50 mg via ORAL
  Filled 2020-05-10 (×2): qty 2

## 2020-05-10 MED ORDER — VITAMIN D3 25 MCG (1000 UNIT) PO TABS
2000.0000 [IU] | ORAL_TABLET | Freq: Every day | ORAL | Status: DC
  Administered 2020-05-10 – 2020-05-11 (×2): 2000 [IU] via ORAL
  Filled 2020-05-10 (×3): qty 2

## 2020-05-10 MED ORDER — VITAMIN A 3 MG (10000 UNIT) PO CAPS
10000.0000 [IU] | ORAL_CAPSULE | Freq: Every day | ORAL | Status: DC
  Administered 2020-05-11: 08:00:00 10000 [IU] via ORAL
  Filled 2020-05-10: qty 1

## 2020-05-10 MED ORDER — AMITRIPTYLINE HCL 25 MG PO TABS
25.0000 mg | ORAL_TABLET | Freq: Every day | ORAL | Status: DC
  Administered 2020-05-10: 20:00:00 25 mg via ORAL
  Filled 2020-05-10: qty 1

## 2020-05-10 MED ORDER — NITROGLYCERIN 0.4 MG SL SUBL: 0.4000 mg | SUBLINGUAL_TABLET | SUBLINGUAL | Status: DC | PRN

## 2020-05-10 MED ORDER — ACETAMINOPHEN 325 MG PO TABS
650.0000 mg | ORAL_TABLET | Freq: Four times a day (QID) | ORAL | Status: DC | PRN
  Administered 2020-05-10: 23:00:00 650 mg via ORAL
  Filled 2020-05-10: qty 2

## 2020-05-10 MED ORDER — CEPHALEXIN 500 MG PO CAPS
500.0000 mg | ORAL_CAPSULE | Freq: Three times a day (TID) | ORAL | Status: DC
  Administered 2020-05-10 – 2020-05-11 (×3): 500 mg via ORAL
  Filled 2020-05-10 (×3): qty 1

## 2020-05-10 MED ORDER — BUPROPION HCL ER (SR) 150 MG PO TB12
150.0000 mg | ORAL_TABLET | Freq: Two times a day (BID) | ORAL | Status: DC
  Administered 2020-05-10 – 2020-05-11 (×2): 150 mg via ORAL
  Filled 2020-05-10 (×3): qty 1

## 2020-05-10 MED ORDER — NITROGLYCERIN 0.4 MG/SPRAY TL SOLN: 1.0000 | Status: DC | PRN

## 2020-05-10 MED ORDER — ATORVASTATIN CALCIUM 20 MG PO TABS
80.0000 mg | ORAL_TABLET | Freq: Every day | ORAL | Status: DC
  Administered 2020-05-11: 80 mg via ORAL
  Filled 2020-05-10: qty 4

## 2020-05-10 MED ORDER — ALBUTEROL SULFATE HFA 108 (90 BASE) MCG/ACT IN AERS
1.0000 | INHALATION_SPRAY | RESPIRATORY_TRACT | Status: DC | PRN
  Filled 2020-05-10: qty 6.7

## 2020-05-10 MED ORDER — SODIUM CHLORIDE 0.9 % IV BOLUS
1000.0000 mL | Freq: Once | INTRAVENOUS | Status: AC
Start: 2020-05-10 — End: 2020-05-10
  Administered 2020-05-10: 1000 mL via INTRAVENOUS

## 2020-05-10 MED ORDER — TETANUS-DIPHTH-ACELL PERTUSSIS 5-2.5-18.5 LF-MCG/0.5 IM SUSY
0.5000 mL | PREFILLED_SYRINGE | Freq: Once | INTRAMUSCULAR | Status: AC
  Administered 2020-05-10: 0.5 mL via INTRAMUSCULAR
  Filled 2020-05-10: qty 0.5

## 2020-05-10 MED ORDER — GABAPENTIN 300 MG PO CAPS
300.0000 mg | ORAL_CAPSULE | Freq: Three times a day (TID) | ORAL | Status: DC
  Administered 2020-05-10 – 2020-05-11 (×2): 300 mg via ORAL
  Filled 2020-05-10 (×2): qty 1

## 2020-05-10 MED ORDER — NICOTINE 21 MG/24HR TD PT24
21.0000 mg | MEDICATED_PATCH | Freq: Every day | TRANSDERMAL | Status: DC
  Administered 2020-05-10: 21 mg via TRANSDERMAL
  Filled 2020-05-10 (×2): qty 1

## 2020-05-10 MED ORDER — ONDANSETRON HCL 4 MG/2ML IJ SOLN: 4.0000 mg | Freq: Three times a day (TID) | INTRAMUSCULAR | Status: DC | PRN

## 2020-05-10 MED ORDER — ARIPIPRAZOLE 5 MG PO TABS
5.0000 mg | ORAL_TABLET | Freq: Every day | ORAL | Status: DC
  Administered 2020-05-11: 08:00:00 5 mg via ORAL
  Filled 2020-05-10: qty 1

## 2020-05-10 MED ORDER — LIDOCAINE-EPINEPHRINE 2 %-1:100000 IJ SOLN
20.0000 mL | Freq: Once | INTRAMUSCULAR | Status: AC
  Administered 2020-05-10: 20 mL

## 2020-05-10 MED ORDER — ADULT MULTIVITAMIN W/MINERALS CH
1.0000 | ORAL_TABLET | Freq: Every day | ORAL | Status: DC
  Administered 2020-05-10: 1 via ORAL
  Filled 2020-05-10: qty 1

## 2020-05-10 MED ORDER — ATENOLOL 50 MG PO TABS
50.0000 mg | ORAL_TABLET | Freq: Every day | ORAL | Status: DC
  Administered 2020-05-11: 50 mg via ORAL
  Filled 2020-05-10: qty 1

## 2020-05-10 MED ORDER — HYDRALAZINE HCL 20 MG/ML IJ SOLN: 5.0000 mg | INTRAMUSCULAR | Status: DC | PRN

## 2020-05-10 MED ORDER — OXYCODONE-ACETAMINOPHEN 5-325 MG PO TABS
1.0000 | ORAL_TABLET | ORAL | Status: DC | PRN
  Administered 2020-05-10 – 2020-05-11 (×4): 1 via ORAL
  Filled 2020-05-10 (×4): qty 1

## 2020-05-10 MED ORDER — PANTOPRAZOLE SODIUM 40 MG PO TBEC
40.0000 mg | DELAYED_RELEASE_TABLET | Freq: Every day | ORAL | Status: DC
  Administered 2020-05-11: 40 mg via ORAL
  Filled 2020-05-10: qty 1

## 2020-05-10 MED ORDER — DULOXETINE HCL 30 MG PO CPEP
120.0000 mg | ORAL_CAPSULE | Freq: Every day | ORAL | Status: DC
  Administered 2020-05-11: 120 mg via ORAL
  Filled 2020-05-10: qty 4

## 2020-05-10 NOTE — ED Notes (Signed)
Joni Fears, MD at bedside suturing wound on head. Wife at bedside and MD stafford updating wife on being admitting

## 2020-05-10 NOTE — ED Notes (Signed)
Patient ambulated with walker by this RN and Sula Soda. Patient dizzy/unsteady. Was able to take a few steps around room.

## 2020-05-10 NOTE — ED Notes (Signed)
Patient given ginger ale. 

## 2020-05-10 NOTE — ED Provider Notes (Signed)
[facility] Emergency Department Provider Note   ____________________________________________   Event Date/Time   First MD Initiated Contact with Patient 05/10/20 [phone]     (approximate)  I have reviewed the triage vital signs and the nursing notes.   HISTORY  Chief Complaint Bleeding   HPI William Keith is a 80 y.o. male brought to the ED via EMS from home with a chief complaint of bleeding head wound.  Patient is on Eliquis for atrial fibrillation.  He was seen in the ED 05/02/2020 for scalp laceration status post fall.  Had a posterior scalp laceration which required 1 suture to control arteriolar bleeding and 17 staples.  Patient states he had staples removed yesterday.  He awoke prior to arrival with blood saturating his pillow; is unsure whether or not he scratched his head during his sleep.  States he got out of the bed, felt dizzy and had a syncopal episode.  EMS reports significant blood loss at the scene, estimates approximately 800 cc.  Reports patient became diaphoretic, pale, dizzy and near syncopal when they sat him up to transfer him to their stretcher.  Also endorses neck pain.  Denies chest pain, shortness of breath, abdominal pain, nausea, vomiting.  States he has been taking Eliquis this whole time since his last fall requiring suture/staple repair.     Past Medical History:  Diagnosis Date  . Atrial fibrillation ([place])   . Diabetes mellitus without complication ([place])   . Dysrhythmia   . Hypercholesteremia   . Hypertension   . Neck pain   . Stroke [facility])     Patient Active Problem List   Diagnosis Date Noted  . Leukocytosis 12/14/2019  . Fever 12/14/2019  . Headache 12/14/2019  . Generalized weakness 12/14/2019  . Sepsis ([place]) 09/27/2018    Past Surgical History:  Procedure Laterality Date  . NO PAST SURGERIES      Prior to Admission medications   Medication Sig Start Date End Date Taking? Authorizing Provider  acetaminophen  (TYLENOL) 325 MG tablet Take 650 mg by mouth every 6 (six) hours as needed for mild pain, fever or headache.    [provider]  albuterol (VENTOLIN HFA) 108 (90 Base) MCG/ACT inhaler Inhale 1 puff into the lungs every 4 (four) hours as needed for wheezing or shortness of breath.    [provider]  ARIPiprazole (ABILIFY) 5 MG tablet Take 1 tablet by mouth daily. 12/02/19   [provider]  aspirin EC 81 MG tablet Take 81 mg by mouth daily.    [provider]  atenolol (TENORMIN) 50 MG tablet Take 50 mg by mouth daily. 11/06/19   [provider]  atorvastatin (LIPITOR) 80 MG tablet Take 80 mg by mouth daily. 08/01/18   [provider]  buPROPion (WELLBUTRIN SR) 150 MG 12 hr tablet Take 150 mg by mouth daily. 07/08/18   [provider]  Cholecalciferol (D2000 ULTRA STRENGTH) 50 MCG (2000 UT) CAPS Take 2,000 Units by mouth daily.    [provider]  DULoxetine (CYMBALTA) 60 MG capsule Take 2 capsules by mouth daily. 10/31/19   [provider]  ELIQUIS 5 MG TABS tablet Take 5 mg by mouth 2 (two) times daily. 07/11/18   [provider]  fluticasone (FLONASE) 50 MCG/ACT nasal spray Place 2 sprays into the nose 2 (two) times daily as needed for rhinitis. 04/13/18 12/13/19  [provider]  gabapentin (NEURONTIN) 100 MG capsule Take 1 capsule (100 mg total) by  mouth 3 (three) times daily for 10 days. 12/18/19 12/28/19  Antonieta Pert, MD  losartan (COZAAR) 50 MG tablet Take 25 mg by mouth daily. 08/13/18   [provider]  Multiple Vitamins-Minerals (MULTIVITAMIN WITH MINERALS) tablet Take 1 tablet by mouth at bedtime.    [provider]  nitroGLYCERIN (NITROLINGUAL) 0.4 MG/SPRAY spray Place 1 spray under the tongue every 5 (five) minutes x 3 doses as needed for chest pain.    [provider]  omeprazole (PRILOSEC) 40 MG capsule Take 40 mg by mouth daily. 09/06/18   [provider]   oxyCODONE-acetaminophen (PERCOCET/ROXICET) 5-325 MG tablet Take 1 tablet by mouth every 6 (six) hours as needed for moderate pain.    [provider]  tamsulosin (FLOMAX) 0.4 MG CAPS capsule Take 0.4 mg by mouth at bedtime.    [provider]  topiramate (TOPAMAX) 50 MG tablet Take 50 mg by mouth at bedtime. 06/18/18   [provider]    Allergies Dilaudid [hydromorphone hcl]  No family history on file.  Social History Social History   Tobacco Use  . Smoking status: Current Every Day Smoker    Types: Pipe  . Smokeless tobacco: Never Used  Substance Use Topics  . Alcohol use: Not Currently    Review of Systems  Constitutional: No fever/chills Eyes: No visual changes. ENT: No sore throat. Cardiovascular: Denies chest pain. Respiratory: Denies shortness of breath. Gastrointestinal: No abdominal pain.  No nausea, no vomiting.  No diarrhea.  No constipation. Genitourinary: Negative for dysuria. Musculoskeletal: Negative for back pain. Skin: Negative for rash. Neurological: Positive for bleeding head wound.  Negative for headaches, focal weakness or numbness.   ____________________________________________   PHYSICAL EXAM:  VITAL SIGNS: ED Triage Vitals  Enc Vitals Group     BP      Pulse      Resp      Temp      Temp src      SpO2      Weight      Height      Head Circumference      Peak Flow      Pain Score      Pain Loc      Pain Edu?      Excl. in [place]?     Constitutional: Alert and oriented.  Ill appearing and in mild acute distress. Eyes: Conjunctivae are normal. PERRL. EOMI. Head: Difficult to examine through clotted blood and matted hair.  No active bleeding currently. Nose: Atraumatic. Mouth/Throat: Mucous membranes are moist.  No dental malocclusion. Neck: No stridor.  No cervical spine tenderness to palpation. Cardiovascular: Normal rate, regular rhythm. Grossly normal heart sounds.  Good peripheral  circulation. Respiratory: Normal respiratory effort.  No retractions. Lungs CTAB. Gastrointestinal: Soft and nontender. No distention. No abdominal bruits. No CVA tenderness. Musculoskeletal: No spinal tenderness to palpation.  Pelvis is stable.  No lower extremity tenderness nor edema.  No joint effusions. Neurologic: Alert and oriented x3.  CN II to XII grossly intact.  Normal speech and language. No gross focal neurologic deficits are appreciated.  Skin:  Skin is pale, diaphoretic and intact. No rash noted. Psychiatric: Mood and affect are normal. Speech and behavior are normal.  ____________________________________________   LABS (all labs ordered are listed, but only abnormal results are displayed)  Labs Reviewed  CBC WITH DIFFERENTIAL/PLATELET - Abnormal; Notable for the following components:      Result Value   WBC 14.1 (*)  RBC 4.04 (*)    Hemoglobin 11.6 (*)    HCT 36.0 (*)    Neutro Abs 8.9 (*)    Monocytes Absolute 1.5 (*)    All other components within normal limits  RESP PANEL BY RT-PCR (FLU A&B, COVID) ARPGX2  COMPREHENSIVE METABOLIC PANEL  PROTIME-INR  TYPE AND SCREEN  TROPONIN I (HIGH SENSITIVITY)   ____________________________________________  EKG  ED ECG REPORT I, Jalexus Brett J, the attending physician, personally viewed and interpreted this ECG.   Date: 05/10/2020  EKG Time: 0635  Rate: 64  Rhythm: normal EKG, normal sinus rhythm  Axis: Normal  Intervals:none  ST&T Change: Nonspecific  ____________________________________________  RADIOLOGY I, Marjean Imperato J, personally viewed and evaluated these images (plain radiographs) as part of my medical decision making, as well as reviewing the written report by the radiologist.  ED MD interpretation: Pending  Official radiology report(s): No results found.  ____________________________________________   PROCEDURES  Procedure(s) performed (including Critical Care):  .1-3 Lead EKG  Interpretation Performed by: Paulette Blanch, MD Authorized by: Paulette Blanch, MD     Interpretation: normal     ECG rate:  64   ECG rate assessment: normal     Rhythm: sinus rhythm     Ectopy: none     Conduction: normal   Comments:     Placed on cardiac monitor to evaluate for arrhythmias     ____________________________________________   INITIAL IMPRESSION / ASSESSMENT AND PLAN / ED COURSE  As part of my medical decision making, I reviewed the following data within the electronic MEDICAL RECORD NUMBER History obtained from family, Nursing notes reviewed and incorporated, Labs reviewed, EKG interpreted, Old chart reviewed, Patient signed out to Dr. Joni Fears and Notes from prior ED visits     80 year old male presenting with bleeding head wound and syncope.  Differential diagnosis includes but is not limited to hemorrhagic shock, acute blood loss anemia, intracranial hemorrhage, orthostasis, etc.  Wound currently not bleeding.  Will obtain urgent CT head and cervical spine.  Obtain lab work including PT/INR and type and screen.  Initiate IV fluid resuscitation.  Clinical Course as of 05/10/20 0703  Sat May 10, 2020  0701 Updated spouse at bedside.  Patient has pinked up and is no longer diaphoretic.  Still no active bleeding; head partially cleaned.  Nursing will cleanse more thoroughly after patient returns from CT scans.  At this time care is transferred to Dr. Joni Fears pending lab and imaging results. [JS]    Clinical Course User Index [JS] Paulette Blanch, MD     ____________________________________________   FINAL CLINICAL IMPRESSION(S) / ED DIAGNOSES  Final diagnoses:  Scalp laceration, initial encounter  Bleeding from wound     ED Discharge Orders    None      *Please note:  William Keith was evaluated in Emergency Department on 05/10/2020 for the symptoms described in the history of present illness. He was evaluated in the context of the global COVID-19 pandemic,  which necessitated consideration that the patient might be at risk for infection with the SARS-CoV-2 virus that causes COVID-19. Institutional protocols and algorithms that pertain to the evaluation of patients at risk for COVID-19 are in a state of rapid change based on information released by regulatory bodies including the CDC and federal and state organizations. These policies and algorithms were followed during the patient's care in the ED.  Some ED evaluations and interventions may be delayed as a result of limited staffing during and the pandemic.*  Note:  This document was prepared using Dragon voice recognition software and may include unintentional dictation errors.   Paulette Blanch, MD 05/10/20 [phone]

## 2020-05-10 NOTE — H&P (Signed)
History and Physical    William Keith [id number] DOB: 1940/01/10 DOA: 05/10/2020  Referring MD/NP/PA:   PCP: Idelle Crouch, MD   Patient coming from:  The patient is coming from home.  At baseline, pt is independent for most of ADL.        Chief Complaint: bleeding from scalp laceration wound  HPI: William Keith is a 80 y.o. male with medical history significant of hypertension, hyperlipidemia, diabetes mellitus, stroke, GERD, depression, atrial fibrillation on Eliquis, BPH, chronic neck pain, tobacco abuse, who presents with bleeding from a scalp laceration wound.  Pt was seen in the ED 05/02/2020 for scalp laceration status post fall.  Had a posterior scalp laceration which required 1 suture to control bleeding and 17 staples.  Patient states he had staples removed yesterday. Pt states that he had bleeding again from the scalp wound this AM. He felt dizzy and lightheadedness.  He fell, injured his head at the same location.  He is not sure if he passed out or not, but states that possibly did not lose consciousness.  No unilateral numbness or tingling to extremities.  No facial droop or slurred speech. EMS reports significant blood loss at the scene, estimates approximately 800 cc.  Patient has chronic neck pain, which has not changed significantly.  Per ED physician, patient had positive orthostatic vital sign in ED.  Denies chest pain, cough, shortness of breath, nausea, vomiting, diarrhea, abdominal pain, symptoms of UTI.  No fever or chills.  Patient states that he has been taking Eliquis this whole time since his last fall.  Last dose of Eliquis was last night.    ED Course: pt was found to have hemoglobin 10.3 (11.6 on 05/10/2020), WBC 13.5, troponin level 3, INR 1.1, renal function okay, blood pressure 118/58, heart rate 58, RR 17.  Patient is placed on MedSurg bed for observation.  CT-C spin: 1. No acute traumatic injury identified in the cervical spine. 2. Stable  postoperative appearance of the cervical spine with stable anterior and posterior hardware and solid arthrodesis from C2 through T1. 3. Aortic Atherosclerosis (ICD10-I70.0).  CT-head: 1. No acute traumatic injury identified in the cervical spine. 2. Stable postoperative appearance of the cervical spine with stable anterior and posterior hardware and solid arthrodesis from C2 through T1. 3. Aortic Atherosclerosis (ICD10-I70.0).    Review of Systems:   General: no fevers, chills, no body weight gain, has fatigue HEENT: no blurry vision, hearing changes or sore throat Respiratory: no dyspnea, coughing, wheezing CV: no chest pain, no palpitations GI: no nausea, vomiting, abdominal pain, diarrhea, constipation GU: no dysuria, burning on urination, increased urinary frequency, hematuria  Ext: no leg edema Neuro: no unilateral weakness, numbness, or tingling, no vision change or hearing loss. Has dizziness and fall Skin: Has scalp laceration and bleeding MSK: No muscle spasm, no deformity, no limitation of range of movement in spin Heme: No easy bruising.  Travel history: No recent long distant travel.  Allergy:  Allergies  Allergen Reactions  . Dilaudid [Hydromorphone Hcl] Other (See Comments)    hallucination    Past Medical History:  Diagnosis Date  . Atrial fibrillation ([place])   . Diabetes mellitus without complication ([place])   . Dysrhythmia   . Hypercholesteremia   . Hypertension   . Neck pain   . Stroke [facility])     Past Surgical History:  Procedure Laterality Date  . NO PAST SURGERIES      Social History:  reports that he has been  smoking pipe. He has never used smokeless tobacco. He reports previous alcohol use. He reports that he does not use drugs.  Family History:  Family History  Problem Relation Age of Onset  . Lung cancer Mother      Prior to Admission medications   Medication Sig Start Date End Date Taking? Authorizing Provider  acetaminophen  (TYLENOL) 325 MG tablet Take 650 mg by mouth every 6 (six) hours as needed for mild pain, fever or headache.    [provider]  albuterol (VENTOLIN HFA) 108 (90 Base) MCG/ACT inhaler Inhale 1 puff into the lungs every 4 (four) hours as needed for wheezing or shortness of breath.    [provider]  ARIPiprazole (ABILIFY) 5 MG tablet Take 1 tablet by mouth daily. 12/02/19   [provider]  aspirin EC 81 MG tablet Take 81 mg by mouth daily.    [provider]  atenolol (TENORMIN) 50 MG tablet Take 50 mg by mouth daily. 11/06/19   [provider]  atorvastatin (LIPITOR) 80 MG tablet Take 80 mg by mouth daily. 08/01/18   [provider]  buPROPion (WELLBUTRIN SR) 150 MG 12 hr tablet Take 150 mg by mouth daily. 07/08/18   [provider]  Cholecalciferol (D2000 ULTRA STRENGTH) 50 MCG (2000 UT) CAPS Take 2,000 Units by mouth daily.    [provider]  DULoxetine (CYMBALTA) 60 MG capsule Take 2 capsules by mouth daily. 10/31/19   [provider]  ELIQUIS 5 MG TABS tablet Take 5 mg by mouth 2 (two) times daily. 07/11/18   [provider]  fluticasone (FLONASE) 50 MCG/ACT nasal spray Place 2 sprays into the nose 2 (two) times daily as needed for rhinitis. 04/13/18 12/13/19  [provider]  gabapentin (NEURONTIN) 100 MG capsule Take 1 capsule (100 mg total) by mouth 3 (three) times daily for 10 days. 12/18/19 12/28/19  Antonieta Pert, MD  losartan (COZAAR) 50 MG tablet Take 25 mg by mouth daily. 08/13/18   [provider]  Multiple Vitamins-Minerals (MULTIVITAMIN WITH MINERALS) tablet Take 1 tablet by mouth at bedtime.    [provider]  nitroGLYCERIN (NITROLINGUAL) 0.4 MG/SPRAY spray Place 1 spray under the tongue every 5 (five) minutes x 3 doses as needed for chest pain.    [provider]  omeprazole (PRILOSEC) 40 MG capsule Take 40 mg by mouth daily. 09/06/18   [provider]   oxyCODONE-acetaminophen (PERCOCET/ROXICET) 5-325 MG tablet Take 1 tablet by mouth every 6 (six) hours as needed for moderate pain.    [provider]  tamsulosin (FLOMAX) 0.4 MG CAPS capsule Take 0.4 mg by mouth at bedtime.    [provider]  topiramate (TOPAMAX) 50 MG tablet Take 50 mg by mouth at bedtime. 06/18/18   [provider]    Physical Exam: Vitals:   05/10/20 1400 05/10/20 1430 05/10/20 1500 05/10/20 1530  BP: 110/60 131/71 (!) 146/67 (!) 152/112  Pulse: 74 74 79 80  Resp: 17 18 20 18   Temp:      TempSrc:      SpO2: 100% 100% 99% 100%  Weight:      Height:       General: Not in acute distress HEENT: Patient has open scalp wound in the top of head, bleeding stopped currently               Eyes: PERRL, EOMI, no scleral icterus.       ENT: No discharge from the ears  and nose, no pharynx injection, no tonsillar enlargement.        Neck: No JVD, no bruit, no mass felt. Heme: No neck lymph node enlargement. Cardiac: S1/S2, RRR, No murmurs, No gallops or rubs. Respiratory: No rales, wheezing, rhonchi or rubs. GI: Soft, nondistended, nontender, no rebound pain, no organomegaly, BS present. GU: No hematuria Ext: No pitting leg edema bilaterally. 1+DP/PT pulse bilaterally. Musculoskeletal: No joint deformities, No joint redness or warmth, no limitation of ROM in spin. Skin: Has scalp laceration Neuro: Alert, oriented X3, cranial nerves II-XII grossly intact, moves all extremities normally. Psych: Patient is not psychotic, no suicidal or hemocidal ideation.  Labs on Admission: I have personally reviewed following labs and imaging studies  CBC: Recent Labs  Lab 05/10/20 0639 05/10/20 0947  WBC 14.1* 13.4*  NEUTROABS 8.9*  --   HGB 11.6* 10.3*  HCT 36.0* 32.0*  MCV 89.1 88.2  PLT 256 740   Basic Metabolic Panel: Recent Labs  Lab 05/10/20 0639  NA 130*  K 4.5  CL 102  CO2 21*  GLUCOSE 162*  BUN 13  CREATININE 0.87  CALCIUM  8.4*   GFR: Estimated Creatinine Clearance: 79.5 mL/min (by C-G formula based on SCr of 0.87 mg/dL). Liver Function Tests: Recent Labs  Lab 05/10/20 0639  AST 19  ALT 18  ALKPHOS 79  BILITOT 0.9  PROT 5.5*  ALBUMIN 3.0*   No results for input(s): LIPASE, AMYLASE in the last 168 hours. No results for input(s): AMMONIA in the last 168 hours. Coagulation Profile: Recent Labs  Lab 05/10/20 0639  INR 1.1   Cardiac Enzymes: No results for input(s): CKTOTAL, CKMB, CKMBINDEX, TROPONINI in the last 168 hours. BNP (last 3 results) No results for input(s): PROBNP in the last 8760 hours. HbA1C: No results for input(s): HGBA1C in the last 72 hours. CBG: No results for input(s): GLUCAP in the last 168 hours. Lipid Profile: No results for input(s): CHOL, HDL, LDLCALC, TRIG, CHOLHDL, LDLDIRECT in the last 72 hours. Thyroid Function Tests: No results for input(s): TSH, T4TOTAL, FREET4, T3FREE, THYROIDAB in the last 72 hours. Anemia Panel: No results for input(s): VITAMINB12, FOLATE, FERRITIN, TIBC, IRON, RETICCTPCT in the last 72 hours. Urine analysis:    Component Value Date/Time   COLORURINE YELLOW 12/13/2019 2150   APPEARANCEUR CLEAR 12/13/2019 2150   LABSPEC 1.021 12/13/2019 2150   PHURINE 5.0 12/13/2019 2150   GLUCOSEU NEGATIVE 12/13/2019 2150   HGBUR NEGATIVE 12/13/2019 2150   [place] NEGATIVE 12/13/2019 2150   KETONESUR NEGATIVE 12/13/2019 2150   PROTEINUR NEGATIVE 12/13/2019 2150   NITRITE NEGATIVE 12/13/2019 2150   LEUKOCYTESUR NEGATIVE 12/13/2019 2150   Sepsis Labs: @LABRCNTIP (procalcitonin:4,lacticidven:4) ) Recent Results (from the past 240 hour(s))  Resp Panel by RT-PCR (Flu A&B, Covid) Nasopharyngeal Swab     Status: None   Collection Time: 05/10/20  6:40 AM   Specimen: Nasopharyngeal Swab; Nasopharyngeal(NP) swabs in vial transport medium  Result Value Ref Range Status   SARS Coronavirus 2 by RT PCR NEGATIVE NEGATIVE Final    Comment: (NOTE) SARS-CoV-2  target nucleic acids are NOT DETECTED.  The SARS-CoV-2 RNA is generally detectable in upper respiratory specimens during the acute phase of infection. The lowest concentration of SARS-CoV-2 viral copies this assay can detect is 138 copies/mL. A negative result does not preclude SARS-Cov-2 infection and should not be used as the sole basis for treatment or other patient management decisions. A negative result may occur with  improper specimen collection/handling, submission of specimen other than nasopharyngeal  swab, presence of viral mutation(s) within the areas targeted by this assay, and inadequate number of viral copies(<138 copies/mL). A negative result must be combined with clinical observations, patient history, and epidemiological information. The expected result is Negative.  Fact Sheet for Patients:  EntrepreneurPulse.com.au  Fact Sheet for Healthcare Providers:  IncredibleEmployment.be  This test is no t yet approved or cleared by the [place] FDA and  has been authorized for detection and/or diagnosis of SARS-CoV-2 by FDA under an Emergency Use Authorization (EUA). This EUA will remain  in effect (meaning this test can be used) for the duration of the COVID-19 declaration under Section 564(b)(1) of the Act, 21 U.S.C.section 360bbb-3(b)(1), unless the authorization is terminated  or revoked sooner.       Influenza A by PCR NEGATIVE NEGATIVE Final   Influenza B by PCR NEGATIVE NEGATIVE Final    Comment: (NOTE) The Xpert Xpress SARS-CoV-2/FLU/RSV plus assay is intended as an aid in the diagnosis of influenza from Nasopharyngeal swab specimens and should not be used as a sole basis for treatment. Nasal washings and aspirates are unacceptable for Xpert Xpress SARS-CoV-2/FLU/RSV testing.  Fact Sheet for Patients: EntrepreneurPulse.com.au  Fact Sheet for Healthcare  Providers: IncredibleEmployment.be  This test is not yet approved or cleared by the [place] FDA and has been authorized for detection and/or diagnosis of SARS-CoV-2 by FDA under an Emergency Use Authorization (EUA). This EUA will remain in effect (meaning this test can be used) for the duration of the COVID-19 declaration under Section 564(b)(1) of the Act, 21 U.S.C. section 360bbb-3(b)(1), unless the authorization is terminated or revoked.  Performed at [facility], [address] [place], [place] [address]      Radiological Exams on Admission: CT Head Wo Contrast  Result Date: 05/10/2020 CLINICAL DATA:  80 year old male on Eliquis for atrial fibrillation. Bleeding from scalp laceration sustained on 05/02/2020 in a fall. Staples removed yesterday. EXAM: CT HEAD WITHOUT CONTRAST TECHNIQUE: Contiguous axial images were obtained from the base of the skull through the vertex without intravenous contrast. COMPARISON:  Head CT 05/02/2020. FINDINGS: Brain: Stable cerebral volume. No midline shift, ventriculomegaly, mass effect, evidence of mass lesion, intracranial hemorrhage or evidence of cortically based acute infarction. Patchy bilateral white matter hypodensity, mild for age is stable. 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 Vascular: Mild Calcified atherosclerosis at the skull base. No suspicious intracranial vascular hyperdensity. Skull: Stable and intact. Sinuses/Orbits: Visualized paranasal sinuses and mastoids are clear. Other: Posterior vertex small scalp hematoma and soft tissue gas communicating with the skin surface on coronal image 48. IMPRESSION: 1. Small posterior vertex scalp hematoma with soft tissue gas communicating with the skin surface. No underlying skull fracture. 2. Stable non contrast CT appearance of the brain with chronic cerebellar infarcts and mild for age white matter changes. Electronically Signed   By: Genevie Ann M.D.    On: 05/10/2020 07:27   CT Cervical Spine Wo Contrast  Result Date: 05/10/2020 CLINICAL DATA:  80 year old male on Eliquis for atrial fibrillation. Bleeding from scalp laceration sustained on 05/02/2020 in a fall. Staples removed yesterday. EXAM: CT CERVICAL SPINE WITHOUT CONTRAST TECHNIQUE: Multidetector CT imaging of the cervical spine was performed without intravenous contrast. Multiplanar CT image reconstructions were also generated. COMPARISON:  Cervical spine CT 05/02/2020. FINDINGS: Alignment: Stable, reversal of lordosis. Skull base and vertebrae: Visualized skull base is intact. No atlanto-occipital dissociation. C1 and C2 appear intact and normally aligned. Degenerative and postoperative details below. No acute osseous abnormality identified.  Soft tissues and spinal canal: No prevertebral fluid or swelling. No visible canal hematoma. No acute finding identified in the noncontrast neck soft tissues. Disc levels: Possible congenital incomplete segmentation of the lower cervical spine, versus chronic degenerative or postoperative interbody ankylosis from C5 through T1. Superimposed ACDF hardware C3 through C5 with solid interbody arthrodesis. Superimposed posterior spinal fusion hardware from C2-C6 with solid posterior element arthrodesis. Superimposed previous posterior cervical decompression C3 and C4. Stable hardware including medial course of the left C2 pedicle/lamina screw across the left lateral recess (series 2, image 29), and medial course of the right C6 screw to the right lateral recess on series 2, image 56. Elsewhere no hardware loosening identified. Upper chest: Visible upper thoracic levels appear intact. Calcified aortic atherosclerosis. Negative lung apices. IMPRESSION: 1. No acute traumatic injury identified in the cervical spine. 2. Stable postoperative appearance of the cervical spine with stable anterior and posterior hardware and solid arthrodesis from C2 through T1. 3. Aortic  Atherosclerosis (ICD10-I70.0). Electronically Signed   By: Genevie Ann M.D.   On: 05/10/2020 07:32     EKG: I have personally reviewed.  Sinus rhythm, QTC 438, right bundle blockade, early R wave progression   Assessment/Plan Principal Problem:   Bleeding from scalp laceration Active Problems:   Leukocytosis   Atrial fibrillation (HCC)   Diabetes mellitus without complication (HCC)   Hypercholesteremia   Hypertension   Stroke [facility])   Neck pain   Dizziness   Depression       Tobacco abuse   Blood loss anemia   Fall   Bleeding from scalp laceration and blood loss anemia: Bleeding has been stopped.  Patient still has open wound, which is sutured up by ED physician. Hgb 11.6 -->10.3  -Placed on MedSurg bed for position -hold Eliquis and ASA -check CBC q8h -check INR.PTT/type screen - Tdap -As needed Percocet and Tylenol for pain -wound care consult  Leukocytosis: WBC 13.4. No fever -will start Keflex empirically 500 mg every 8 hour -Blood culture  Atrial fibrillation (HCC) -Hold Eliquis -Atenolol atenolol  Diet controled diabetes mellitus without complication ([place]): Recent A1c 6.8 on 12/14/2019, well controlled.  Patient is not taking medications.  Blood sugar 162. -No treatment needed now  Hypercholesteremia -Lipitor  Hypertension -As needed IV hydralazine -Atenolol, Cozaar  Stroke (HCC) -Hold aspirin as above -Continue Lipitor  Neck pain: This is chronic issue. CT of c spin showed stable postoperative appearance of the cervical spine with stable anterior and posterior hardware and solid arthrodesis from C2 through T1. -As needed Tylenol and percocet  Dizziness and fall: Most likely due to volume depletion secondary to bleeding.  Patient had positive orthostatic vital sign per ED physician.  No focal neurodeficit on physical examination.  CT scan of head is negative for acute intracranial abnormalities. -PT/OT -IV fluid: 1.5 L normal  saline  Depression -Continue home medications  Tobacco abuse -nicotine patch    DVT ppx: SCD Code Status: Partial code (I discussed with the patient, and explained the meaning of CODE STATUS, patient wants to be partial code, OK for CPR, but no intubation). Family Communication: called his wife Disposition Plan:  Anticipate discharge back to previous environment Consults called:  none Admission status and Level of care: Med-Surg:   for obs   Status is: Observation  The patient remains OBS appropriate and will d/c before 2 midnights.  Dispo: The patient is from: Home              Anticipated d/c  is to: Home              Patient currently is not medically stable to d/c.   Difficult to place patient No          Date of Service 05/10/2020    Ivor Costa [facility]   If 7PM-7AM, please contact night-coverage [email or web address] 05/10/2020, 4:01 PM

## 2020-05-10 NOTE — ED Provider Notes (Signed)
..  Laceration Repair  Date/Time: 05/10/2020 11:54 AM Performed by: Carrie Mew, MD Authorized by: Carrie Mew, MD   Consent:    Consent obtained:  Verbal   Consent given by:  Patient   Risks discussed:  Infection, pain, retained foreign body, poor cosmetic result and poor wound healing Universal protocol:    Patient identity confirmed:  Arm band and verbally with patient Anesthesia:    Anesthesia method:  Local infiltration   Local anesthetic:  Lidocaine 2% WITH epi Laceration details:    Location:  Scalp   Scalp location:  Crown   Length (cm):  12 Pre-procedure details:    Preparation:  Patient was prepped and draped in usual sterile fashion and imaging obtained to evaluate for foreign bodies Exploration:    Hemostasis achieved with:  Direct pressure   Imaging outcome: foreign body not noted     Wound exploration: entire depth of wound visualized     Wound extent: no foreign bodies/material noted, no muscle damage noted, no nerve damage noted, no underlying fracture noted and no vascular damage noted     Contaminated: no   Treatment:    Area cleansed with:  Saline and povidone-iodine   Amount of cleaning:  Extensive   Irrigation solution:  Sterile saline   Irrigation volume:  500   Irrigation method:  Pressure wash   Visualized foreign bodies/material removed: no     Debridement:  Minimal   Undermining:  Extensive   Scar revision: no   Skin repair:    Repair method:  Sutures   Suture size:  3-0   Suture material:  Nylon   Suture technique:  Running   Number of sutures:  30 Approximation:    Approximation:  Close Repair type:    Repair type:  Simple Post-procedure details:    Dressing:  Open (no dressing)   Procedure completion:  Tolerated well, no immediate complications    Clinical Course as of 05/10/20 1153  Sat May 10, 2020  0701 Updated spouse at bedside.  Patient has pinked up and is no longer diaphoretic.  Still no active bleeding; head partially  cleaned.  Nursing will cleanse more thoroughly after patient returns from CT scans.  At this time care is transferred to Dr. Joni Fears pending lab and imaging results. [JS]    Clinical Course User Index [JS] Paulette Blanch, MD    ----------------------------------------- 11:53 AM on 05/10/2020 -----------------------------------------    Repeat CBC shows decrease of hemoglobin from 11-10 over 2-hour interval.  No active bleeding currently.  This is down from baseline hemoglobin of 13.  Despite getting a liter of IV fluids, patient is still lightheaded with standing, and has orthostatic hypotension.  We will need to hospitalize for further observation until symptomatically improved and steady on his feet to be safe at home and able to continue his ADLs.  Does not need a transfusion at this time.  Tetanus updated.       Carrie Mew, MD 05/10/20 [phone]

## 2020-05-10 NOTE — ED Notes (Signed)
Informed RN bed assigned 

## 2020-05-11 DIAGNOSIS — I10 Essential (primary) hypertension: Secondary | ICD-10-CM

## 2020-05-11 DIAGNOSIS — S0101XA Laceration without foreign body of scalp, initial encounter: Secondary | ICD-10-CM | POA: Diagnosis not present

## 2020-05-11 DIAGNOSIS — I480 Paroxysmal atrial fibrillation: Secondary | ICD-10-CM

## 2020-05-11 DIAGNOSIS — R58 Hemorrhage, not elsewhere classified: Secondary | ICD-10-CM

## 2020-05-11 LAB — BASIC METABOLIC PANEL
Anion gap: 5 (ref 5–15)
BUN: 11 mg/dL (ref 8–23)
CO2: 24 mmol/L (ref 22–32)
Calcium: 8.9 mg/dL (ref 8.9–10.3)
Chloride: 105 mmol/L (ref 98–111)
Creatinine, Ser: 0.78 mg/dL (ref 0.61–1.24)
GFR, Estimated: >60 mL/min (ref >60)
Glucose, Bld: 130 mg/dL — ABNORMAL HIGH (ref 70–99)
Potassium: 4.1 mmol/L (ref 3.5–5.1)
Sodium: 134 mmol/L — ABNORMAL LOW (ref 135–145)

## 2020-05-11 LAB — CBC
HCT: 29.9 % — ABNORMAL LOW (ref 39.0–52.0)
Hemoglobin: 9.5 g/dL — ABNORMAL LOW (ref 13.0–17.0)
MCH: 28.4 pg (ref 26.0–34.0)
MCHC: 31.8 g/dL (ref 30.0–36.0)
MCV: 89.3 fL (ref 80.0–100.0)
Platelets: 219 10*3/uL (ref 150–400)
RBC: 3.35 MIL/uL — ABNORMAL LOW (ref 4.22–5.81)
RDW: 15 % (ref 11.5–15.5)
WBC: 13.8 10*3/uL — ABNORMAL HIGH (ref 4.0–10.5)
nRBC: 0 % (ref 0.0–0.2)

## 2020-05-11 MED ORDER — CEPHALEXIN 500 MG PO CAPS: 500.0000 mg | ORAL_CAPSULE | Freq: Three times a day (TID) | ORAL | 0 refills | Status: AC

## 2020-05-11 NOTE — TOC Progression Note (Signed)
Transition of Care [facility]) - Progression Note    Patient Details  Name: William Keith MRN: [id number] Date of Birth: October 08, 1940  [facility]) CM/SW Contact  Izola Price, RN Phone Number: 05/11/2020, 12:12 PM  Clinical Narrative:   Patient readied for discharge when CM notified of [place] orders. Complicated by the fact that patient receives PT at [place] where he lives with spouse, William Keith. HH orders included RN for wound care of laceration from fall that resulted in 800 ml blood loss according to provider DC summary. Concern for safety issues, CM reached out to [facility] regarding more acute 1:1 PT and RN for discharge would care instructions. Spoke with wife as she as leaving with patient and she asked to have [facility] contact her at home tomorrow or Monday. Expressed concerns about fall safety/wound care and whether ALF can provide at level needed at discharge. Relayed this information to Floydene Flock at [facility] who will contact them at home once patient is settled. Patient may refuse service, but CM wanted to make sure it was offered. Simmie Davies RN CM [phone] pm. [phone].          Expected Discharge Plan and Services           Expected Discharge Date: 05/11/20                                     Social Determinants of Health (SDOH) Interventions    Readmission Risk Interventions No flowsheet data found.

## 2020-05-11 NOTE — Discharge Summary (Addendum)
Physician Discharge Summary  Patient ID: William Keith MRN: [id number] DOB/AGE: Mar 13, 1940 80 y.o.  Admit date: 05/10/2020 Discharge date: 05/11/2020  Admission Diagnoses:  Discharge Diagnoses:  Principal Problem:   Bleeding from scalp laceration Active Problems:   Leukocytosis   Atrial fibrillation (HCC)   Diabetes mellitus without complication (HCC)   Hypercholesteremia   Hypertension   Stroke [facility])   Neck pain   Dizziness   Depression       Tobacco abuse   Blood loss anemia   Fall Mild hyponatremia Paroxysmal atrial fibrillation.  Currently in sinus. RBBB.  Discharged Condition: fair  Hospital Course:   Jackey Housey is a 80 y.o. male with medical history significant of hypertension, hyperlipidemia, diabetes mellitus, stroke, GERD, depression, atrial fibrillation on Eliquis, BPH, chronic neck pain, tobacco abuse, who presents with bleeding from a scalp laceration wound. Patient has a steady gait, he did not have syncope episodes.  He had a recent fall on 4/29, he had a sutures placed and removed.  Then he fell again on the same spot, requiring suturing.  He had 800 mL of blood loss during the process.  After the fall, patient was admitted to the hospital for observation.  Hemoglobin has been stable this morning at 9.5.  No additional bleeding from wound. At this point, patient is medically stable to be discharged.  We will set up home PT and home care nurse.  Patient is also advised to follow-up with PCP in 5 days to remove the sutures. Due to active bleeding, I will hold aspirin and Eliquis for now until seen by family doctor.  Consults: None  Significant Diagnostic Studies: CT HEAD WITHOUT CONTRAST  TECHNIQUE: Contiguous axial images were obtained from the base of the skull through the vertex without intravenous contrast.  COMPARISON:  Head CT 05/02/2020.  FINDINGS: Brain: Stable cerebral volume. No midline shift, ventriculomegaly, mass effect, evidence of  mass lesion, intracranial hemorrhage or evidence of cortically based acute infarction. Patchy bilateral white matter hypodensity, mild for age is stable. 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  Vascular: Mild Calcified atherosclerosis at the skull base. No suspicious intracranial vascular hyperdensity.  Skull: Stable and intact.  Sinuses/Orbits: Visualized paranasal sinuses and mastoids are clear.  Other: Posterior vertex small scalp hematoma and soft tissue gas communicating with the skin surface on coronal image 48.  IMPRESSION: 1. Small posterior vertex scalp hematoma with soft tissue gas communicating with the skin surface. No underlying skull fracture. 2. Stable non contrast CT appearance of the brain with chronic cerebellar infarcts and mild for age white matter changes.   Electronically Signed   By: Genevie Ann M.D.   On: 05/10/2020 07:27  CT CERVICAL SPINE WITHOUT CONTRAST  TECHNIQUE: Multidetector CT imaging of the cervical spine was performed without intravenous contrast. Multiplanar CT image reconstructions were also generated.  COMPARISON:  Cervical spine CT 05/02/2020.  FINDINGS: Alignment: Stable, reversal of lordosis.  Skull base and vertebrae: Visualized skull base is intact. No atlanto-occipital dissociation. C1 and C2 appear intact and normally aligned. Degenerative and postoperative details below. No acute osseous abnormality identified.  Soft tissues and spinal canal: No prevertebral fluid or swelling. No visible canal hematoma. No acute finding identified in the noncontrast neck soft tissues.  Disc levels: Possible congenital incomplete segmentation of the lower cervical spine, versus chronic degenerative or postoperative interbody ankylosis from C5 through T1.  Superimposed ACDF hardware C3 through C5 with solid interbody arthrodesis.  Superimposed posterior spinal fusion hardware from C2-C6 with  solid posterior element arthrodesis.  Superimposed previous posterior cervical decompression C3 and C4.  Stable hardware including medial course of the left C2 pedicle/lamina screw across the left lateral recess (series 2, image 29), and medial course of the right C6 screw to the right lateral recess on series 2, image 56. Elsewhere no hardware loosening identified.  Upper chest: Visible upper thoracic levels appear intact. Calcified aortic atherosclerosis. Negative lung apices.  IMPRESSION: 1. No acute traumatic injury identified in the cervical spine. 2. Stable postoperative appearance of the cervical spine with stable anterior and posterior hardware and solid arthrodesis from C2 through T1. 3. Aortic Atherosclerosis (ICD10-I70.0).   Electronically Signed   By: Genevie Ann M.D.   On: 05/10/2020 07:32   Treatments: Observation  Discharge Exam: Blood pressure (!) 147/62, pulse 74, temperature 98.1 F (36.7 C), resp. rate 17, height 5\' 8"  (1.727 m), weight 105 kg, SpO2 100 %. General appearance: alert and cooperative Resp: clear to auscultation bilaterally Cardio: regular rate and rhythm, S1, S2 normal, no murmur, click, rub or gallop GI: soft, non-tender; bowel sounds normal; no masses,  no organomegaly Extremities: extremities normal, atraumatic, no cyanosis or edema  Posterior scalp laceration with sutures.  Disposition: Discharge disposition: 01-Home or Self Care       Discharge Instructions    Diet - low sodium heart healthy   Complete by: As directed    Discharge wound care:   Complete by: As directed    Keep clean, follow with visiting RN, remove suture in 5 days after visiting PCP,   Increase activity slowly   Complete by: As directed      Allergies as of 05/11/2020      Reactions   Dilaudid [hydromorphone Hcl] Other (See Comments)   hallucination      Medication List    STOP taking these medications   aspirin EC 81 MG tablet   Eliquis 5 MG  Tabs tablet Generic drug: apixaban   oxyCODONE 5 MG immediate release tablet Commonly known as: Oxy IR/ROXICODONE     TAKE these medications   acetaminophen 325 MG tablet Commonly known as: TYLENOL Take 650 mg by mouth every 6 (six) hours as needed for mild pain, fever or headache.   albuterol 108 (90 Base) MCG/ACT inhaler Commonly known as: VENTOLIN HFA Inhale 1 puff into the lungs every 4 (four) hours as needed for wheezing or shortness of breath.   amitriptyline 25 MG tablet Commonly known as: ELAVIL Take 25 mg by mouth at bedtime.   ARIPiprazole 5 MG tablet Commonly known as: ABILIFY Take 1 tablet by mouth daily.   atenolol 50 MG tablet Commonly known as: TENORMIN Take 50 mg by mouth daily.   atorvastatin 80 MG tablet Commonly known as: LIPITOR Take 80 mg by mouth daily.   buPROPion 150 MG 12 hr tablet Commonly known as: WELLBUTRIN SR Take 150 mg by mouth 2 (two) times daily.   cephALEXin 500 MG capsule Commonly known as: KEFLEX Take 1 capsule (500 mg total) by mouth every 8 (eight) hours for 5 days.   D2000 Ultra Strength 50 MCG (2000 UT) Caps Generic drug: Cholecalciferol Take 2,000 Units by mouth daily.   DULoxetine 60 MG capsule Commonly known as: CYMBALTA Take 2 capsules by mouth daily.   gabapentin 300 MG capsule Commonly known as: NEURONTIN Take 300 mg by mouth 3 (three) times daily. What changed: Another medication with the same name was removed. Continue taking this medication, and follow the directions you see here.  losartan 50 MG tablet Commonly known as: COZAAR Take 25 mg by mouth daily.   multivitamin with minerals tablet Take 1 tablet by mouth at bedtime.   nitroGLYCERIN 0.4 MG/SPRAY spray Commonly known as: NITROLINGUAL Place 1 spray under the tongue every 5 (five) minutes x 3 doses as needed for chest pain.   omeprazole 40 MG capsule Commonly known as: PRILOSEC Take 40 mg by mouth daily.   oxyCODONE-acetaminophen 5-325 MG  tablet Commonly known as: PERCOCET/ROXICET Take 1 tablet by mouth every 6 (six) hours as needed for moderate pain.   tamsulosin 0.4 MG Caps capsule Commonly known as: FLOMAX Take 0.4 mg by mouth at bedtime.   topiramate 50 MG tablet Commonly known as: TOPAMAX Take 50 mg by mouth at bedtime.   vitamin A 3 MG (10000 UNITS) capsule Take 10,000 Units by mouth daily.            Discharge Care Instructions  (From admission, onward)         Start     Ordered   05/11/20 0000  Discharge wound care:       Comments: Keep clean, follow with visiting RN, remove suture in 5 days after visiting PCP,   05/11/20 0926          Follow-up Information    Idelle Crouch, MD Follow up in 5 day(s).   Specialty: Internal Medicine Contact information: [place] [address] [phone]               Signed: Sharen Hones 05/11/2020, 9:27 AM

## 2020-05-11 NOTE — Plan of Care (Signed)
Shift Summary: Pt orientedx4, VSS, remains on RA. Pain addressed with PRN Percocet and tylenol per MAR. Small amount drainage from head wound, hair washed and dried blood removed with care around sutures. Ambulated to bathroom several times with use of walker, mild dizziness noted moving from lying to sitting, but pt reports much improved from prior. NSR on telemetry. Fall/safety precautions in place, rounding performed, needs/concerns addressed during shift.   Problem: Education: Goal: Knowledge of General Education information will improve Description: Including pain rating scale, medication(s)/side effects and non-pharmacologic comfort measures 05/11/2020 0439 by Jocelyn Lamer, RN Outcome: Progressing 05/10/2020 2117 by Jocelyn Lamer, RN Outcome: Progressing   Problem: Health Behavior/Discharge Planning: Goal: Ability to manage health-related needs will improve 05/11/2020 0439 by Jocelyn Lamer, RN Outcome: Progressing 05/10/2020 2117 by Jocelyn Lamer, RN Outcome: Progressing   Problem: Clinical Measurements: Goal: Ability to maintain clinical measurements within normal limits will improve 05/11/2020 0439 by Jocelyn Lamer, RN Outcome: Progressing 05/10/2020 2117 by Jocelyn Lamer, RN Outcome: Progressing Goal: Will remain free from infection 05/11/2020 0439 by Jocelyn Lamer, RN Outcome: Progressing 05/10/2020 2117 by Jocelyn Lamer, RN Outcome: Progressing Goal: Diagnostic test results will improve 05/11/2020 0439 by Jocelyn Lamer, RN Outcome: Progressing 05/10/2020 2117 by Jocelyn Lamer, RN Outcome: Progressing Goal: Respiratory complications will improve 05/11/2020 0439 by Jocelyn Lamer, RN Outcome: Progressing 05/10/2020 2117 by Jocelyn Lamer, RN Outcome: Progressing Goal: Cardiovascular complication will be avoided 05/11/2020 0439 by Jocelyn Lamer, RN Outcome: Progressing 05/10/2020 2117 by Jocelyn Lamer, RN Outcome: Progressing   Problem: Activity: Goal: Risk for  activity intolerance will decrease 05/11/2020 0439 by Jocelyn Lamer, RN Outcome: Progressing 05/10/2020 2117 by Jocelyn Lamer, RN Outcome: Progressing   Problem: Nutrition: Goal: Adequate nutrition will be maintained 05/11/2020 0439 by Jocelyn Lamer, RN Outcome: Progressing 05/10/2020 2117 by Jocelyn Lamer, RN Outcome: Progressing   Problem: Coping: Goal: Level of anxiety will decrease 05/11/2020 0439 by Jocelyn Lamer, RN Outcome: Progressing 05/10/2020 2117 by Jocelyn Lamer, RN Outcome: Progressing   Problem: Elimination: Goal: Will not experience complications related to bowel motility 05/11/2020 0439 by Jocelyn Lamer, RN Outcome: Progressing 05/10/2020 2117 by Jocelyn Lamer, RN Outcome: Progressing Goal: Will not experience complications related to urinary retention 05/11/2020 0439 by Jocelyn Lamer, RN Outcome: Progressing 05/10/2020 2117 by Jocelyn Lamer, RN Outcome: Progressing   Problem: Pain Managment: Goal: General experience of comfort will improve 05/11/2020 0439 by Jocelyn Lamer, RN Outcome: Progressing 05/10/2020 2117 by Jocelyn Lamer, RN Outcome: Progressing   Problem: Safety: Goal: Ability to remain free from injury will improve 05/11/2020 0439 by Jocelyn Lamer, RN Outcome: Progressing 05/10/2020 2117 by Jocelyn Lamer, RN Outcome: Progressing   Problem: Skin Integrity: Goal: Risk for impaired skin integrity will decrease 05/11/2020 0439 by Jocelyn Lamer, RN Outcome: Progressing 05/10/2020 2117 by Jocelyn Lamer, RN Outcome: Progressing

## 2020-05-11 NOTE — Evaluation (Signed)
Physical Therapy Evaluation Patient Details Name: Alexiz Sustaita MRN: [id number] DOB: July 05, 1940 Today's Date: 05/11/2020   History of Present Illness  presented to ER secondary to fall; admitted for management of scalp laceration, blood loss anemia.  Clinical Impression  Upon evaluation, patient alert and oriented; follows commands and agreeable to participation with session. Hopeful for discharge later today.  Denies pain at this time; no active bleeding noted from scalp lac during session.  Bilat UE/LE strength and ROM grossly symmetrical and WFL; no focal weakness appreciated.  Noted limitations in cervical rotation and extension, maintains forward head, rounded shoulder posturing; significant history of cervical surgeries, endorses posture is baseline for him.  Able to complete bed mobility with min assist; sit/stand, basic transfers and gait (200') with RW, cga/min assist.  Demonstrates reciprocal stepping pattern with fair/good step height/length; fair cadence and overall gait speed (10' walk time, 9-10 seconds). Mild sway with head turns/dynamic gait components, but self-corrects without therapist intervention.  Does endorse very mild dizziness that resolves with accommodation to position Would benefit from skilled PT to address above deficits and promote optimal return to PLOF.; Recommend transition to HHPT upon discharge from acute hospitalization.     Follow Up Recommendations Home health PT (patient requesting resumption of services at [facility])    Equipment Recommendations       Recommendations for Other Services       Precautions / Restrictions Precautions Precautions: Fall Restrictions Weight Bearing Restrictions: No      Mobility  Bed Mobility Overal bed mobility: Needs Assistance Bed Mobility: Supine to Sit     Supine to sit: Min assist     General bed mobility comments: assist for truncal elevation; spontaneously reaches for therapist to assist     Transfers Overall transfer level: Needs assistance Equipment used: Rolling walker (2 wheeled) Transfers: Sit to/from Stand Sit to Stand: Min guard         General transfer comment: cuing for hand placement to prevent pulling on RW  Ambulation/Gait Ambulation/Gait assistance: Min guard Gait Distance (Feet): 200 Feet Assistive device: Rolling walker (2 wheeled)   Gait velocity: 10' walk time, 8-9 seconds   General Gait Details: reciprocal stepping pattern with fair/good step height/length; fair cadence and overall gait speed. Mild sway with head turns/dynamic gait components, but self-corrects without therapist intervention.  Does endorse very mild dizziness that resolves with accommodation to position  Stairs            Wheelchair Mobility    Modified Rankin (Stroke Patients Only)       Balance Overall balance assessment: Needs assistance Sitting-balance support: Feet supported;No upper extremity supported Sitting balance-Leahy Scale: Good     Standing balance support: Bilateral upper extremity supported Standing balance-Leahy Scale: Fair                               Pertinent Vitals/Pain Pain Assessment: No/denies pain    Home Living Family/patient expects to be discharged to:: Private residence Living Arrangements: Spouse/significant other Available Help at Discharge: Family;Available 24 hours/day Type of Home: Independent living facility Home Access: Level entry     Home Layout: One level Home Equipment: Walker - 4 wheels;Other (comment);Shower seat;Grab bars - toilet;Grab bars - tub/shower      Prior Function Level of Independence: Independent with assistive device(s)         Comments: Mod indep with ADLs, household mobilization without assist device; rollator for community distances.  Does  endorse at least 3 falls in previous six months, "because of my clumsy feet"     Hand Dominance        Extremity/Trunk Assessment    Upper Extremity Assessment Upper Extremity Assessment: Overall WFL for tasks assessed    Lower Extremity Assessment Lower Extremity Assessment: Overall WFL for tasks assessed       Communication   Communication: No difficulties  Cognition Arousal/Alertness: Awake/alert Behavior During Therapy: WFL for tasks assessed/performed Overall Cognitive Status: Within Functional Limits for tasks assessed                                        General Comments      Exercises Other Exercises Other Exercises: Orthostatic assessment (see vitals flowsheet for results).  Does demonstrate moderate drop in SBP from supine to sit, but appears to stabilize with further transition to upright.  Tolerates gait trial without any worsening of subjective symptoms, noted improvement in BP after gait trial (BP 10756). Other Exercises: Did review strategies for management of dizziness/orthostasis with position change and general home safety modifications to allow for incremental breaks if symptoms arose; patient voiced understanding of all information.   Assessment/Plan    PT Assessment Patient needs continued PT services  PT Problem List Decreased activity tolerance;Decreased balance;Decreased mobility       PT Treatment Interventions DME instruction;Gait training;Functional mobility training;Therapeutic activities;Therapeutic exercise;Balance training;Patient/family education    PT Goals (Current goals can be found in the Care Plan section)  Acute Rehab PT Goals Patient Stated Goal: to get back home PT Goal Formulation: With patient Time For Goal Achievement: 05/25/20 Potential to Achieve Goals: Good    Frequency Min 2X/week   Barriers to discharge        Co-evaluation               AM-PAC PT "6 Clicks" Mobility  Outcome Measure Help needed turning from your back to your side while in a flat bed without using bedrails?: None Help needed moving from lying on your back to  sitting on the side of a flat bed without using bedrails?: A Little Help needed moving to and from a bed to a chair (including a wheelchair)?: A Little Help needed standing up from a chair using your arms (e.g., wheelchair or bedside chair)?: A Little Help needed to walk in hospital room?: A Little   6 Click Score: 16    End of Session Equipment Utilized During Treatment: Gait belt Activity Tolerance: Patient tolerated treatment well Patient left: in chair;with call bell/phone within reach;with chair alarm set Nurse Communication: Mobility status PT Visit Diagnosis: History of falling (Z91.81);Difficulty in walking, not elsewhere classified (R26.2)    Time: 3016-0109 PT Time Calculation (min) (ACUTE ONLY): 20 min   Charges:   PT Evaluation $PT Eval Moderate Complexity: 1 Mod PT Treatments $Therapeutic Activity: 8-22 mins        Alveena Taira H. Owens Shark, PT, DPT, NCS 05/11/20, 11:24 AM [phone]

## 2020-05-11 NOTE — Progress Notes (Signed)
Pt being discharged home, discharge instructions reviewed with pt and wife, states understanding, pt with no complaints 

## 2020-05-11 NOTE — TOC Transition Note (Signed)
Transition of Care [facility]) - CM/SW Discharge Note   Patient Details  Name: [name] MRN: [id number] Date of Birth: [birth year]  [facility] CM/SW Contact:  Izola Price, RN Phone Number: 05/11/2020, 12:20 PM   Clinical Narrative:  Patient being discharged with spouse. [place] to reach out to spouse/patient Monday regarding services after spouse has a chance to see what ALF is able to do for wound care also ordered in [facility] orders via RN. Safety concerns addressed with spouse and more acute PT services to transition home due to severity of laceration/fall. She will discuss with patient and then speak to [facility]. Corene Cornea has patient information and permissions from spouse to contact her at home. Simmie Davies RN CM      Final next level of care: Home w Home Health Services Barriers to Discharge: Barriers Resolved   Patient Goals and CMS Choice        Discharge Placement                       Discharge Plan and Services                DME Arranged: N/A DME Agency: NA       HH Arranged: RN,PT (Patient receives some PT at ALF, but discussed with spouse need for more acute PT to transition back. SHe will speak to [facility] on Monday.) [place]: [place] ([facility]) Date [place]: 05/11/20 Time [place]: 1220 Representative spoke with at [place]: [place] ([facility]) Interventions     Readmission Risk Interventions No flowsheet data found.

## 2020-05-12 ENCOUNTER — Emergency Department: Payer: Medicare Other

## 2020-05-12 ENCOUNTER — Emergency Department
Admission: EM | Admit: 2020-05-12 | Discharge: 2020-05-12 | Disposition: A | Discharge status: Home / Self Care | Payer: Medicare Other | Attending: Emergency Medicine | Admitting: Emergency Medicine

## 2020-05-12 ENCOUNTER — Other Ambulatory Visit: Payer: Self-pay

## 2020-05-12 DIAGNOSIS — I959 Hypotension, unspecified: Secondary | ICD-10-CM | POA: Insufficient documentation

## 2020-05-12 DIAGNOSIS — Z7901 Long term (current) use of anticoagulants: Secondary | ICD-10-CM | POA: Diagnosis not present

## 2020-05-12 DIAGNOSIS — R0602 Shortness of breath: Secondary | ICD-10-CM

## 2020-05-12 DIAGNOSIS — R42 Dizziness and giddiness: Secondary | ICD-10-CM

## 2020-05-12 DIAGNOSIS — F17290 Nicotine dependence, other tobacco product, uncomplicated: Secondary | ICD-10-CM | POA: Diagnosis not present

## 2020-05-12 DIAGNOSIS — Z79899 Other long term (current) drug therapy: Secondary | ICD-10-CM | POA: Insufficient documentation

## 2020-05-12 DIAGNOSIS — E119 Type 2 diabetes mellitus without complications: Secondary | ICD-10-CM | POA: Insufficient documentation

## 2020-05-12 DIAGNOSIS — I4891 Unspecified atrial fibrillation: Secondary | ICD-10-CM | POA: Insufficient documentation

## 2020-05-12 DIAGNOSIS — I10 Essential (primary) hypertension: Secondary | ICD-10-CM | POA: Diagnosis not present

## 2020-05-12 DIAGNOSIS — E860 Dehydration: Secondary | ICD-10-CM | POA: Diagnosis not present

## 2020-05-12 LAB — URINALYSIS, COMPLETE (UACMP) WITH MICROSCOPIC
Bacteria, UA: NONE SEEN
Bilirubin Urine: NEGATIVE
Glucose, UA: NEGATIVE mg/dL
Hgb urine dipstick: NEGATIVE
Ketones, ur: NEGATIVE mg/dL
Leukocytes,Ua: NEGATIVE
Nitrite: NEGATIVE
Protein, ur: NEGATIVE mg/dL
Specific Gravity, Urine: 1.004 — ABNORMAL LOW (ref 1.005–1.030)
Squamous Epithelial / HPF: NONE SEEN (ref 0–5)
pH: 6 (ref 5.0–8.0)

## 2020-05-12 LAB — TROPONIN I (HIGH SENSITIVITY)
Troponin I (High Sensitivity): 3 ng/L (ref <18)
Troponin I (High Sensitivity): 2 ng/L (ref <18)

## 2020-05-12 LAB — BASIC METABOLIC PANEL
Anion gap: 8 (ref 5–15)
BUN: 12 mg/dL (ref 8–23)
CO2: 23 mmol/L (ref 22–32)
Calcium: 9.1 mg/dL (ref 8.9–10.3)
Chloride: 99 mmol/L (ref 98–111)
Creatinine, Ser: 0.92 mg/dL (ref 0.61–1.24)
GFR, Estimated: >60 mL/min (ref >60)
Glucose, Bld: 148 mg/dL — ABNORMAL HIGH (ref 70–99)
Potassium: 4.3 mmol/L (ref 3.5–5.1)
Sodium: 130 mmol/L — ABNORMAL LOW (ref 135–145)

## 2020-05-12 LAB — CBC
HCT: 30.5 % — ABNORMAL LOW (ref 39.0–52.0)
Hemoglobin: 9.7 g/dL — ABNORMAL LOW (ref 13.0–17.0)
MCH: 28.3 pg (ref 26.0–34.0)
MCHC: 31.8 g/dL (ref 30.0–36.0)
MCV: 88.9 fL (ref 80.0–100.0)
Platelets: 256 10*3/uL (ref 150–400)
RBC: 3.43 MIL/uL — ABNORMAL LOW (ref 4.22–5.81)
RDW: 14.6 % (ref 11.5–15.5)
WBC: 14 10*3/uL — ABNORMAL HIGH (ref 4.0–10.5)
nRBC: 0 % (ref 0.0–0.2)

## 2020-05-12 LAB — LACTIC ACID, PLASMA
Lactic Acid, Venous: 1.3 mmol/L (ref 0.5–1.9)
Lactic Acid, Venous: 1.3 mmol/L (ref 0.5–1.9)

## 2020-05-12 LAB — CBG MONITORING, ED: Glucose-Capillary: 119 mg/dL — ABNORMAL HIGH (ref 70–99)

## 2020-05-12 MED ORDER — SODIUM CHLORIDE 0.9 % IV BOLUS
1000.0000 mL | Freq: Once | INTRAVENOUS | Status: AC
  Administered 2020-05-12: 1000 mL via INTRAVENOUS

## 2020-05-12 NOTE — Discharge Instructions (Addendum)
Your labs today were okay.  Please increase your water intake and stop taking your blood pressure medicine for now. Please follow up with your primary care doctor to continue monitoring your symptoms.

## 2020-05-12 NOTE — ED Provider Notes (Signed)
[facility] Emergency Department Provider Note  ____________________________________________  Time seen: Approximately 4:19 PM  I have reviewed the triage vital signs and the nursing notes.   HISTORY  Chief Complaint Hypotension and Dizziness    HPI William Keith is a 80 y.o. male with a history of atrial fibrillation diabetes hypertension hyperlipidemia who is brought to the ED due to dizziness and low blood pressure.  Reportedly at his home the home care nurse today found him to have a blood pressure of about 70/30 today.  Patient denies any pain or shortness of breath.  No vomiting or diarrhea.  Reports has been eating and drinking normally.  Patient was admitted to the hospital 2 days ago for low blood pressure and dizziness with standing in the setting of acute blood loss from a scalp wound.  The wound was closed with running Ethilon sutures  but had a potentially nonviable flap component.  No recurrent fall or trauma.  Denies headache.  He has continued to hold his Eliquis and reports checking with his own doctor who agrees that it would be okay to discontinue it for a few months while his scalp wound heals.     Past Medical History:  Diagnosis Date  . Atrial fibrillation ([place])   . Diabetes mellitus without complication ([place])   . Dysrhythmia   . Hypercholesteremia   . Hypertension   . Neck pain   . Stroke [facility])      Patient Active Problem List   Diagnosis Date Noted  . Bleeding from scalp laceration 05/10/2020  . Blood loss anemia 05/10/2020  . Fall 05/10/2020  . Atrial fibrillation ([place])   . Diabetes mellitus without complication ([place])   . Hypercholesteremia   . Hypertension   . Stroke ([place])   . Neck pain   . Dizziness   . Depression   .     Marland Kitchen Tobacco abuse   . Leukocytosis 12/14/2019  . Fever 12/14/2019  . Headache 12/14/2019  . Generalized weakness 12/14/2019  . Sepsis ([place]) 09/27/2018     Past Surgical History:   Procedure Laterality Date  . NO PAST SURGERIES       Prior to Admission medications   Medication Sig Start Date End Date Taking? Authorizing Provider  amitriptyline (ELAVIL) 25 MG tablet Take 25 mg by mouth at bedtime. 04/15/20  Yes [provider]  ARIPiprazole (ABILIFY) 5 MG tablet Take 1 tablet by mouth daily. 12/02/19  Yes [provider]  atenolol (TENORMIN) 50 MG tablet Take 50 mg by mouth daily. 11/06/19  Yes [provider]  atorvastatin (LIPITOR) 80 MG tablet Take 80 mg by mouth daily. 08/01/18  Yes [provider]  buPROPion (WELLBUTRIN SR) 150 MG 12 hr tablet Take 150 mg by mouth 2 (two) times daily. 07/08/18  Yes [provider]  cephALEXin (KEFLEX) 500 MG capsule Take 1 capsule (500 mg total) by mouth every 8 (eight) hours for 5 days. 05/11/20 05/16/20 Yes Sharen Hones, MD  Cholecalciferol (D2000 ULTRA STRENGTH) 50 MCG (2000 UT) CAPS Take 2,000 Units by mouth daily.   Yes [provider]  DULoxetine (CYMBALTA) 60 MG capsule Take 2 capsules by mouth daily. 10/31/19  Yes [provider]  gabapentin (NEURONTIN) 300 MG capsule Take 300 mg by mouth 3 (three) times daily. 04/16/20  Yes [provider]  losartan (COZAAR) 50 MG tablet Take 50 mg by mouth daily. 08/13/18  Yes [provider]  omeprazole (PRILOSEC) 40 MG capsule Take 40 mg  by mouth daily. 09/06/18  Yes [provider]  tamsulosin (FLOMAX) 0.4 MG CAPS capsule Take 0.4 mg by mouth at bedtime.   Yes [provider]  topiramate (TOPAMAX) 50 MG tablet Take 50 mg by mouth at bedtime. 06/18/18  Yes [provider]  vitamin A 3 MG (10000 UNITS) capsule Take 10,000 Units by mouth daily.   Yes [provider]  acetaminophen (TYLENOL) 325 MG tablet Take 650 mg by mouth every 6 (six) hours as needed for mild pain, fever or headache. Patient not taking: Reported on 05/12/2020    [provider]  apixaban (ELIQUIS) 5 MG TABS  tablet Take 1 tablet by mouth 2 (two) times daily. 04/30/20   [provider]  ezetimibe (ZETIA) 10 MG tablet Take 1 tablet by mouth daily. Patient not taking: No sig reported 09/07/17   [provider]  Multiple Vitamins-Minerals (MULTIVITAMIN WITH MINERALS) tablet Take 1 tablet by mouth at bedtime. Patient not taking: Reported on 05/12/2020    [provider]  nitroGLYCERIN (NITROLINGUAL) 0.4 MG/SPRAY spray Place 1 spray under the tongue every 5 (five) minutes x 3 doses as needed for chest pain. Patient not taking: Reported on 05/12/2020    [provider]  oxyCODONE-acetaminophen (PERCOCET/ROXICET) 5-325 MG tablet Take 1 tablet by mouth every 6 (six) hours as needed for moderate pain. Patient not taking: Reported on 05/12/2020    [provider]     Allergies Dilaudid [hydromorphone hcl]   Family History  Problem Relation Age of Onset  . Lung cancer Mother     Social History Social History   Tobacco Use  . Smoking status: Current Every Day Smoker    Types: Pipe  . Smokeless tobacco: Never Used  Substance Use Topics  . Alcohol use: Not Currently  . Drug use: Never    Review of Systems  Constitutional:   No fever or chills.  ENT:   No sore throat. No rhinorrhea. Cardiovascular:   No chest pain or syncope. Respiratory:   No dyspnea or cough. Gastrointestinal:   Negative for abdominal pain, vomiting and diarrhea.  Musculoskeletal:   Negative for focal pain or swelling All other systems reviewed and are negative except as documented above in ROS and HPI.  ____________________________________________   PHYSICAL EXAM:  VITAL SIGNS: ED Triage Vitals  Enc Vitals Group     BP 05/12/20 1426 (!) 99/52     Pulse Rate 05/12/20 1426 68     Resp 05/12/20 1426 19     Temp 05/12/20 1426 (!) 97.5 F (36.4 C)     Temp Source 05/12/20 1426 Oral     SpO2 05/12/20 1426 100 %     Weight 05/12/20 1427 230 lb (104.3 kg)     Height 05/12/20 1427  5\' 8"  (1.727 m)     Head Circumference --      Peak Flow --      Pain Score 05/12/20 1427 0     Pain Loc --      Pain Edu? --      Excl. in [place]? --     Vital signs reviewed, nursing assessments reviewed.   Constitutional:   Alert and oriented. Non-toxic appearance. Eyes:   Conjunctivae are normal. EOMI. PERRL. ENT      Head:   Normocephalic with apical scalp wound as previously seen.  Sutures are in place, it is hemostatic.  There is fluctuance under the flaps indicative of subcutaneous hematoma.  No inflammatory changes to suggest  infection. The flap portion that was previously discolored and possibly nonviable now shows itself to be black and desiccated, necrotic.     Marland Kitchen      Nose:   Normal.      Mouth/Throat: Dry mucous membranes      Neck:   No meningismus. Full ROM. Hematological/Lymphatic/Immunilogical:   No cervical lymphadenopathy. Cardiovascular:   RRR. Symmetric bilateral radial and DP pulses.  No murmurs. Cap refill less than 2 seconds. Respiratory:   Normal respiratory effort without tachypnea/retractions. Breath sounds are clear and equal bilaterally. No wheezes/rales/rhonchi. Gastrointestinal:   Soft and nontender. Non distended. There is no CVA tenderness.  No rebound, rigidity, or guarding. Genitourinary:   deferred Musculoskeletal:   Normal range of motion in all extremities. No joint effusions.  No lower extremity tenderness.  No edema. Neurologic:   Normal speech and language.  Motor grossly intact. No acute focal neurologic deficits are appreciated.  Skin:    Skin is warm, dry and intact. No rash noted.  No petechiae, purpura, or bullae.  ____________________________________________    LABS (pertinent positives/negatives) (all labs ordered are listed, but only abnormal results are displayed) Labs Reviewed  BASIC METABOLIC PANEL - Abnormal; Notable for the following components:      Result Value   Sodium 130 (*)    Glucose, Bld 148 (*)    All other  components within normal limits  CBC - Abnormal; Notable for the following components:   WBC 14.0 (*)    RBC 3.43 (*)    Hemoglobin 9.7 (*)    HCT 30.5 (*)    All other components within normal limits  URINALYSIS, COMPLETE (UACMP) WITH MICROSCOPIC - Abnormal; Notable for the following components:   Color, Urine STRAW (*)    APPearance CLEAR (*)    Specific Gravity, Urine 1.004 (*)    All other components within normal limits  CBG MONITORING, ED - Abnormal; Notable for the following components:   Glucose-Capillary 119 (*)    All other components within normal limits  LACTIC ACID, PLASMA  LACTIC ACID, PLASMA  TROPONIN I (HIGH SENSITIVITY)  TROPONIN I (HIGH SENSITIVITY)   ____________________________________________   EKG  Interpreted by me Sinus rhythm rate of 71, normal axis.  Right bundle branch block with isolated T wave inversion in V2, nonspecific.  No ST's.  No regional ischemic findings.  ____________________________________________    RADIOLOGY  DG Chest Port 1 View  Result Date: 05/12/2020 CLINICAL DATA:  Hypotension and dizziness starting today EXAM: PORTABLE CHEST 1 VIEW COMPARISON:  Portable exam 1456 hours compared to 12/13/2019 FINDINGS: Normal heart size, mediastinal contours, and pulmonary vascularity. Increased opacity at medial LEFT lung base question atelectasis versus consolidation. Remaining lungs clear. No pleural effusion or pneumothorax. Prior cervical spine fusion. IMPRESSION: Question atelectasis versus consolidation LEFT lower lobe. Electronically Signed   By: Lavonia Dana M.D.   On: 05/12/2020 15:21   DG Abd Portable 2 Views  Result Date: 05/12/2020 CLINICAL DATA:  Abdominal distension. EXAM: PORTABLE ABDOMEN - 2 VIEW COMPARISON:  09/27/2018 FINDINGS: There is no bowel dilation to suggest obstruction. No free air. Soft tissues poorly defined, grossly unremarkable. No acute skeletal abnormality. IMPRESSION: 1. No acute findings. No bowel dilation to  suggest obstruction. No free air. Electronically Signed   By: Lajean Manes M.D.   On: 05/12/2020 16:29    ____________________________________________   PROCEDURES Procedures  ____________________________________________  DIFFERENTIAL DIAGNOSIS   Dehydration, autonomic insufficiency, electrolyte abnormality, AKI  CLINICAL IMPRESSION / ASSESSMENT AND PLAN /  ED COURSE  Medications ordered in the ED: Medications  sodium chloride 0.9 % bolus 1,000 mL (0 mLs Intravenous Stopped 05/12/20 1616)    Pertinent labs & imaging results that were available during my care of the patient were reviewed by me and considered in my medical decision making (see chart for details).  William Keith was evaluated in Emergency Department on 05/12/2020 for the symptoms described in the history of present illness. He was evaluated in the context of the global COVID-19 pandemic, which necessitated consideration that the patient might be at risk for infection with the SARS-CoV-2 virus that causes COVID-19. Institutional protocols and algorithms that pertain to the evaluation of patients at risk for COVID-19 are in a state of rapid change based on information released by regulatory bodies including the CDC and federal and state organizations. These policies and algorithms were followed during the patient's care in the ED.   Patient presents with dizziness today, found to have low blood pressure at home.  On arrival to the ED blood pressure is on the low end of normal after fluids is increased to about 130/80.  Patient is completely asymptomatic at this time and never had any cardiac symptoms or other worrisome symptoms today.  Will check orthostatics.  He does not have symptoms of bowel obstruction but with hypotension, I recommended CT scan.  Patient and spouse are hesitant to do a CT scan today, so I will obtain an abdominal x-ray.  The necrotic flap remains in place and provides a wound covering, but will be  expected to dehisce when sutures are removed.  I recommended to them to leave the sutures in place for 2 weeks.  They are traveling to [place] at that time, so they would likely leave the sutures in longer, and plan to follow-up with dermatology or plastic surgery.  Patient is not concerned with worse cosmetic output from sutures staying in too long, and ensuring adequate wound closure and healing is his priority right now.  Clinical Course as of 05/12/20 1729  Mon May 12, 2020  1727 After IV fluid bolus, orthostatic vital signs are normal, symptoms resolved.  2 view abdominal x-ray is normal.  Urinalysis is normal.  Stable for discharge. [PS]    Clinical Course User Index [PS] Carrie Mew, MD     ____________________________________________   FINAL CLINICAL IMPRESSION(S) / ED DIAGNOSES    Final diagnoses:  Dehydration  Dizziness     ED Discharge Orders    None      Portions of this note were generated with dragon dictation software. Dictation errors may occur despite best attempts at proofreading.   Carrie Mew, MD 05/12/20 1730

## 2020-05-12 NOTE — ED Triage Notes (Signed)
Pt to ED POV for hypotension and dizziness that started today.  Hx HTN, pt appears to be poor historian. Alert and oriented.  Denies pain   Pt states recently d/c for bleeding from head lac

## 2020-05-12 NOTE — ED Notes (Signed)
Pt diaphoretic and shaking. CBG checked. Dr Tamala Julian made aware

## 2020-05-13 ENCOUNTER — Other Ambulatory Visit: Payer: Self-pay

## 2020-05-13 ENCOUNTER — Emergency Department: Payer: Medicare Other

## 2020-05-13 ENCOUNTER — Emergency Department
Admission: EM | Admit: 2020-05-13 | Discharge: 2020-05-13 | Disposition: A | Discharge status: Home / Self Care | Payer: Medicare Other | Attending: Emergency Medicine | Admitting: Emergency Medicine

## 2020-05-13 DIAGNOSIS — R42 Dizziness and giddiness: Secondary | ICD-10-CM | POA: Diagnosis not present

## 2020-05-13 DIAGNOSIS — I10 Essential (primary) hypertension: Secondary | ICD-10-CM | POA: Insufficient documentation

## 2020-05-13 DIAGNOSIS — E119 Type 2 diabetes mellitus without complications: Secondary | ICD-10-CM | POA: Insufficient documentation

## 2020-05-13 DIAGNOSIS — R55 Syncope and collapse: Secondary | ICD-10-CM

## 2020-05-13 DIAGNOSIS — Z7901 Long term (current) use of anticoagulants: Secondary | ICD-10-CM | POA: Diagnosis not present

## 2020-05-13 DIAGNOSIS — Z79899 Other long term (current) drug therapy: Secondary | ICD-10-CM | POA: Diagnosis not present

## 2020-05-13 DIAGNOSIS — I959 Hypotension, unspecified: Secondary | ICD-10-CM | POA: Insufficient documentation

## 2020-05-13 DIAGNOSIS — R531 Weakness: Secondary | ICD-10-CM | POA: Diagnosis not present

## 2020-05-13 DIAGNOSIS — F17290 Nicotine dependence, other tobacco product, uncomplicated: Secondary | ICD-10-CM | POA: Diagnosis not present

## 2020-05-13 LAB — URINALYSIS, COMPLETE (UACMP) WITH MICROSCOPIC
Bacteria, UA: NONE SEEN
Bilirubin Urine: NEGATIVE
Glucose, UA: NEGATIVE mg/dL
Hgb urine dipstick: NEGATIVE
Ketones, ur: NEGATIVE mg/dL
Leukocytes,Ua: NEGATIVE
Nitrite: NEGATIVE
Protein, ur: NEGATIVE mg/dL
Specific Gravity, Urine: 1.015 (ref 1.005–1.030)
pH: 5 (ref 5.0–8.0)

## 2020-05-13 LAB — BLOOD CULTURE ID PANEL (REFLEXED) - BCID2

## 2020-05-13 LAB — BASIC METABOLIC PANEL
Anion gap: 6 (ref 5–15)
BUN: 12 mg/dL (ref 8–23)
CO2: 24 mmol/L (ref 22–32)
Calcium: 8.7 mg/dL — ABNORMAL LOW (ref 8.9–10.3)
Chloride: 101 mmol/L (ref 98–111)
Creatinine, Ser: 0.89 mg/dL (ref 0.61–1.24)
GFR, Estimated: >60 mL/min (ref >60)
Glucose, Bld: 119 mg/dL — ABNORMAL HIGH (ref 70–99)
Potassium: 4.4 mmol/L (ref 3.5–5.1)
Sodium: 131 mmol/L — ABNORMAL LOW (ref 135–145)

## 2020-05-13 LAB — CBC
HCT: 26.3 % — ABNORMAL LOW (ref 39.0–52.0)
Hemoglobin: 8.6 g/dL — ABNORMAL LOW (ref 13.0–17.0)
MCH: 29 pg (ref 26.0–34.0)
MCHC: 32.7 g/dL (ref 30.0–36.0)
MCV: 88.6 fL (ref 80.0–100.0)
Platelets: 240 10*3/uL (ref 150–400)
RBC: 2.97 MIL/uL — ABNORMAL LOW (ref 4.22–5.81)
RDW: 14.8 % (ref 11.5–15.5)
WBC: 11.3 10*3/uL — ABNORMAL HIGH (ref 4.0–10.5)
nRBC: 0 % (ref 0.0–0.2)

## 2020-05-13 MED ORDER — SODIUM CHLORIDE 0.9 % IV BOLUS
1000.0000 mL | Freq: Once | INTRAVENOUS | Status: AC
  Administered 2020-05-13: 1000 mL via INTRAVENOUS

## 2020-05-13 NOTE — Progress Notes (Signed)
PHARMACY - PHYSICIAN COMMUNICATION CRITICAL VALUE ALERT - BLOOD CULTURE IDENTIFICATION (BCID)  Willis Holquin is an 80 y.o. male who presented to [facility] on 05/10/2020 with a chief complaint of scalp laceration   Assessment:  Staph species in 1 of 4 bottles, no resistance detected.   Most likely a contaminant.  D/C'd from [facility] on 5/8 .  (include suspected source if known)  Name of physician (or Provider) Contacted: Zhang, B. Randol Kern  Current antibiotics:  Cephalexin 500 mg PO Q8H   Changes to prescribed antibiotics recommended:  Patient is on recommended antibiotics - No changes needed  No results found for this or any previous visit.  Shon Mansouri D 05/13/2020  1:31 AM

## 2020-05-13 NOTE — ED Provider Notes (Signed)
[facility] Emergency Department Provider Note  ____________________________________________   Event Date/Time   First MD Initiated Contact with Patient 05/13/20 1547     (approximate)  I have reviewed the triage vital signs and the nursing notes.   HISTORY  Chief Complaint Hypotension and Near Syncope   HPI William Keith is a 80 y.o. male who presents to the ER for evaluation of hypotension, dizziness and near syncope. Patient was seen yesterday and evaluated for same. He was recently seen for an incident in which he hit his head, was started on new medication keflex. When evaluated yesterday, he was given 1L of fluid and felt improvement. Remainder of work up at that time was unremarkable. Today, he states he awoke feeling well, went to the river as is his customary routine, and when he returned to his home around 11 AM felt weakness in his legs, dizziness, near syncope sensation. This has persisted since that time. He endorses shortness of breath that was also present yesterday.  He states he has had some oozing of his wound, as noted by blood on his pillow at night, but denies any consistent bleeding.  He denies fevers, chest pain, abdominal pain, dysuria, or other complaint.  Of note, he did forget to hold his blood pressure medications last night as he was previously instructed to do.  He did not take blood pressure medicine this morning.        Past Medical History:  Diagnosis Date  . Atrial fibrillation ([place])   . Diabetes mellitus without complication ([place])   . Dysrhythmia   . Hypercholesteremia   . Hypertension   . Neck pain   . Stroke [facility])     Patient Active Problem List   Diagnosis Date Noted  . Bleeding from scalp laceration 05/10/2020  . Blood loss anemia 05/10/2020  . Fall 05/10/2020  . Atrial fibrillation ([place])   . Diabetes mellitus without complication ([place])   . Hypercholesteremia   . Hypertension   . Stroke ([place])   . Neck pain    . Dizziness   . Depression   .     Marland Kitchen Tobacco abuse   . Leukocytosis 12/14/2019  . Fever 12/14/2019  . Headache 12/14/2019  . Generalized weakness 12/14/2019  . Sepsis ([place]) 09/27/2018    Past Surgical History:  Procedure Laterality Date  . NO PAST SURGERIES      Prior to Admission medications   Medication Sig Start Date End Date Taking? Authorizing Provider  acetaminophen (TYLENOL) 325 MG tablet Take 650 mg by mouth every 6 (six) hours as needed for mild pain, fever or headache. Patient not taking: Reported on 05/12/2020    [provider]  amitriptyline (ELAVIL) 25 MG tablet Take 25 mg by mouth at bedtime. 04/15/20   [provider]  apixaban (ELIQUIS) 5 MG TABS tablet Take 1 tablet by mouth 2 (two) times daily. 04/30/20   [provider]  ARIPiprazole (ABILIFY) 5 MG tablet Take 1 tablet by mouth daily. 12/02/19   [provider]  atenolol (TENORMIN) 50 MG tablet Take 50 mg by mouth daily. 11/06/19   [provider]  atorvastatin (LIPITOR) 80 MG tablet Take 80 mg by mouth daily. 08/01/18   [provider]  buPROPion (WELLBUTRIN SR) 150 MG 12 hr tablet Take 150 mg by mouth 2 (two) times daily. 07/08/18   [provider]  cephALEXin (KEFLEX) 500 MG capsule Take 1 capsule (500 mg total) by mouth every 8 (eight) hours  for 5 days. 05/11/20 05/16/20  Sharen Hones, MD  Cholecalciferol (D2000 ULTRA STRENGTH) 50 MCG (2000 UT) CAPS Take 2,000 Units by mouth daily.    [provider]  DULoxetine (CYMBALTA) 60 MG capsule Take 2 capsules by mouth daily. 10/31/19   [provider]  ezetimibe (ZETIA) 10 MG tablet Take 1 tablet by mouth daily. Patient not taking: No sig reported 09/07/17   [provider]  gabapentin (NEURONTIN) 300 MG capsule Take 300 mg by mouth 3 (three) times daily. 04/16/20   [provider]  losartan (COZAAR) 50 MG tablet Take 50 mg by mouth daily. 08/13/18   [provider]   Multiple Vitamins-Minerals (MULTIVITAMIN WITH MINERALS) tablet Take 1 tablet by mouth at bedtime. Patient not taking: Reported on 05/12/2020    [provider]  nitroGLYCERIN (NITROLINGUAL) 0.4 MG/SPRAY spray Place 1 spray under the tongue every 5 (five) minutes x 3 doses as needed for chest pain. Patient not taking: Reported on 05/12/2020    [provider]  omeprazole (PRILOSEC) 40 MG capsule Take 40 mg by mouth daily. 09/06/18   [provider]  oxyCODONE-acetaminophen (PERCOCET/ROXICET) 5-325 MG tablet Take 1 tablet by mouth every 6 (six) hours as needed for moderate pain. Patient not taking: Reported on 05/12/2020    [provider]  tamsulosin (FLOMAX) 0.4 MG CAPS capsule Take 0.4 mg by mouth at bedtime.    [provider]  topiramate (TOPAMAX) 50 MG tablet Take 50 mg by mouth at bedtime. 06/18/18   [provider]  vitamin A 3 MG (10000 UNITS) capsule Take 10,000 Units by mouth daily.    [provider]    Allergies Dilaudid [hydromorphone hcl]  Family History  Problem Relation Age of Onset  . Lung cancer Mother     Social History Social History   Tobacco Use  . Smoking status: Current Every Day Smoker    Types: Pipe  . Smokeless tobacco: Never Used  Substance Use Topics  . Alcohol use: Not Currently  . Drug use: Never    Review of Systems Constitutional: +presyncopal feeling, No fever/chills Eyes: No visual changes. ENT: No sore throat. Cardiovascular: Denies chest pain. Respiratory: + shortness of breath. Gastrointestinal: No abdominal pain.  No nausea, no vomiting.  No diarrhea.  No constipation. Genitourinary: Negative for dysuria. Musculoskeletal: Negative for back pain. Skin: + Healing head wound, negative for rash. Neurological: +dizziness,  Negative for headaches, focal weakness or numbness.   ____________________________________________   PHYSICAL EXAM:  VITAL SIGNS: ED Triage Vitals  Enc  Vitals Group     BP 05/13/20 1542 (!) 121/47     Pulse Rate 05/13/20 1542 78     Resp 05/13/20 1542 18     Temp 05/13/20 1542 98.8 F (37.1 C)     Temp Source 05/13/20 1542 Oral     SpO2 05/13/20 1542 99 %     Weight 05/13/20 1543 220 lb (99.8 kg)     Height 05/13/20 1543 5\' 8"  (1.727 m)     Head Circumference --      Peak Flow --      Pain Score 05/13/20 1543 0     Pain Loc --      Pain Edu? --      Excl. in [place]? --    Constitutional: Alert and oriented. Well appearing and in no acute distress. Eyes: Conjunctivae are normal. PERRL. EOMI. Head: Sutured wound on the top of his head with necrotic center.  Sutures remain  in place with no active bleeding. Nose: No congestion/rhinnorhea. Mouth/Throat: Mucous membranes are moist.  Oropharynx non-erythematous. Neck: No stridor.   Cardiovascular: Normal rate, regular rhythm. Grossly normal heart sounds.  Good peripheral circulation.  No pitting edema. Respiratory: Normal respiratory effort.  No retractions. Lungs CTAB. Gastrointestinal: Soft and nontender. No distention. No abdominal bruits. No CVA tenderness.  Rectal exam reveals brown stool, guaiac negative. Musculoskeletal: No lower extremity tenderness nor edema.  No joint effusions. Neurologic:  Normal speech and language.  Cranial nerves II through XII grossly intact.  No gross focal neurologic deficits are appreciated. No gait instability. Skin:  Skin is warm, dry and intact except head wound as described above. No rash noted. Psychiatric: Mood and affect are normal. Speech and behavior are normal.  ____________________________________________   LABS (all labs ordered are listed, but only abnormal results are displayed)  Labs Reviewed  URINALYSIS, COMPLETE (UACMP) WITH MICROSCOPIC - Abnormal; Notable for the following components:      Result Value   Color, Urine YELLOW (*)    APPearance HAZY (*)    All other components within normal limits  CBC - Abnormal; Notable for the  following components:   WBC 11.3 (*)    RBC 2.97 (*)    Hemoglobin 8.6 (*)    HCT 26.3 (*)    All other components within normal limits  BASIC METABOLIC PANEL - Abnormal; Notable for the following components:   Sodium 131 (*)    Glucose, Bld 119 (*)    Calcium 8.7 (*)    All other components within normal limits   ____________________________________________  EKG  EKG with normal sinus rhythm with rate of 77 bpm.  Evidence of right bundle branch block.  No ST elevations or depressions.  No evidence of acute ischemia. ____________________________________________  RADIOLOGY I, Marlana Salvage, personally viewed and evaluated these images (plain radiographs) as part of my medical decision making, as well as reviewing the written report by the radiologist.  ED provider interpretation: No acute focal pneumonia.  Official radiology report(s): DG Chest Portable 1 View  Result Date: 05/13/2020 CLINICAL DATA:  Shortness of breath EXAM: PORTABLE CHEST 1 VIEW COMPARISON:  May 12, 2020 FINDINGS: There has been interval clearing of apparent atelectasis from the left base. Currently no edema or airspace opacity. Heart size and pulmonary vascularity are normal. No adenopathy. No bone lesions. IMPRESSION: Lungs clear.  Cardiac silhouette within normal limits. Electronically Signed   By: Lowella Grip III M.D.   On: 05/13/2020 16:54   ____________________________________________   INITIAL IMPRESSION / ASSESSMENT AND PLAN / ED COURSE  As part of my medical decision making, I reviewed the following data within the [place] notes reviewed and incorporated, Labs reviewed, Radiograph reviewed, Evaluated by EM attending Dr. Charna Archer and Notes from prior ED visits        Patient is an 80 year old male who presents to the emergency department for recurrence of symptoms of brief syncopal sensation, dizziness when standing.  He was seen and treated yesterday for similar, and  previously recently treated for a head injury.  Yesterday he received a liter of fluids, was feeling improved and was discharged home where he was doing well until about 11 AM.  He of note did take his blood pressure medication last night accidentally, as he was supposed to be holding this.  In triage, patient's pressure is 121/47 sitting, remainder vitals unremarkable.  Orthostatics were obtained and the patient drops to 89/42 when standing,  and was symptomatic.  Physical exam is grossly unremarkable for acute change, as described above.  Labs reviewed indicate mild hyponatremia 131, hemoglobin 8.6 compared to 9.7 yesterday, remainder of labs grossly unremarkable.  Patient denies any known significant source of bleeding, he is guaiac negative on exam.  Hemoglobin decrease possibly secondary to hydration with IV fluids yesterday and decreased hemoconcentration.  X-ray obtained today demonstrates no focal abnormality, there was concern on yesterday's x-ray of atelectasis versus early infiltrate, this is not appreciated on today's exam.  EKG with normal sinus rhythm with a right bundle branch block with no evidence of acute ischemia or arrhythmia.  Patient was treated with 1 L of IV fluids, repeat vitals are improved with a sitting pressure of 147/52 and standing becomes 116/45.  He does still endorse some dizziness with the orthostatic changes.  Given that the patient has recently been seen multiple times for the same complaint with difficulty with orthostatics, patient was offered admission.  After discussion with both myself and Dr. Charna Archer, the patient and wife choose to go home with close PCP follow-up.  Recommend follow-up at the end of this week or early next week for recheck of labs, strict return precautions were discussed with the patient and his wife and they are amenable with plan.  Patient stable at this time for outpatient management.      ____________________________________________   FINAL  CLINICAL IMPRESSION(S) / ED DIAGNOSES  Final diagnoses:  Near syncope  Weakness     ED Discharge Orders    None      *Please note:  Zuri Bradway was evaluated in Emergency Department on 05/13/2020 for the symptoms described in the history of present illness. He was evaluated in the context of the global COVID-19 pandemic, which necessitated consideration that the patient might be at risk for infection with the SARS-CoV-2 virus that causes COVID-19. Institutional protocols and algorithms that pertain to the evaluation of patients at risk for COVID-19 are in a state of rapid change based on information released by regulatory bodies including the CDC and federal and state organizations. These policies and algorithms were followed during the patient's care in the ED.  Some ED evaluations and interventions may be delayed as a result of limited staffing during and the pandemic.*   Note:  This document was prepared using Dragon voice recognition software and may include unintentional dictation errors.   Marlana Salvage, PA 05/13/20 Yevette Edwards    Blake Divine, MD 05/13/20 [phone]

## 2020-05-13 NOTE — ED Notes (Signed)
Pt complains of numbness below the waist and that his legs gave out on him; has SOB and low BP

## 2020-05-13 NOTE — ED Triage Notes (Signed)
Pt arrives via POV with wife. Hx of being a poor historian. Pt seen yesterday for hypotension and dizziness. Pt returned today with feelings of same. At home bp of 102/52. Dizziness increases when standing. BP currently 121/47 HR 78 in triage. Pt a&o x 4. NAD noted at this time.

## 2020-05-13 NOTE — Discharge Instructions (Addendum)
Continue to hold your blood pressure medications and do not take them until you follow-up with your primary care physician.  Schedule follow-up with them late this week or early next week.  Please return to the emergency department if you experience any worsening of symptoms.

## 2020-05-15 LAB — CULTURE, BLOOD (ROUTINE X 2)
Culture: NO GROWTH
Special Requests: ADEQUATE

## 2020-05-28 DIAGNOSIS — G250 Essential tremor: Secondary | ICD-10-CM | POA: Insufficient documentation

## 2020-06-05 DIAGNOSIS — S0100XA Unspecified open wound of scalp, initial encounter: Secondary | ICD-10-CM | POA: Insufficient documentation

## 2020-06-05 DIAGNOSIS — L98492 Non-pressure chronic ulcer of skin of other sites with fat layer exposed: Secondary | ICD-10-CM | POA: Insufficient documentation

## 2020-06-05 DIAGNOSIS — E119 Type 2 diabetes mellitus without complications: Secondary | ICD-10-CM | POA: Insufficient documentation

## 2020-06-10 ENCOUNTER — Encounter: Payer: Self-pay | Admitting: Nurse Practitioner

## 2020-06-10 ENCOUNTER — Inpatient Hospital Stay: Payer: Medicare Other | Attending: Nurse Practitioner | Admitting: Nurse Practitioner

## 2020-06-10 ENCOUNTER — Inpatient Hospital Stay: Payer: Medicare Other

## 2020-06-10 VITALS — BP 112/53 | HR 88 | Temp 97.8°F | Resp 20 | Wt 226.3 lb

## 2020-06-10 DIAGNOSIS — M2550 Pain in unspecified joint: Secondary | ICD-10-CM | POA: Insufficient documentation

## 2020-06-10 DIAGNOSIS — D649 Anemia, unspecified: Secondary | ICD-10-CM

## 2020-06-10 DIAGNOSIS — R296 Repeated falls: Secondary | ICD-10-CM | POA: Diagnosis not present

## 2020-06-10 DIAGNOSIS — R42 Dizziness and giddiness: Secondary | ICD-10-CM

## 2020-06-10 DIAGNOSIS — F32A Depression, unspecified: Secondary | ICD-10-CM | POA: Diagnosis not present

## 2020-06-10 DIAGNOSIS — Z801 Family history of malignant neoplasm of trachea, bronchus and lung: Secondary | ICD-10-CM | POA: Insufficient documentation

## 2020-06-10 DIAGNOSIS — R5383 Other fatigue: Secondary | ICD-10-CM | POA: Insufficient documentation

## 2020-06-10 DIAGNOSIS — E669 Obesity, unspecified: Secondary | ICD-10-CM | POA: Diagnosis not present

## 2020-06-10 DIAGNOSIS — M7918 Myalgia, other site: Secondary | ICD-10-CM | POA: Insufficient documentation

## 2020-06-10 DIAGNOSIS — I951 Orthostatic hypotension: Secondary | ICD-10-CM | POA: Insufficient documentation

## 2020-06-10 DIAGNOSIS — Z993 Dependence on wheelchair: Secondary | ICD-10-CM | POA: Diagnosis not present

## 2020-06-10 DIAGNOSIS — Z6834 Body mass index (BMI) 34.0-34.9, adult: Secondary | ICD-10-CM | POA: Insufficient documentation

## 2020-06-10 DIAGNOSIS — E538 Deficiency of other specified B group vitamins: Secondary | ICD-10-CM | POA: Diagnosis not present

## 2020-06-10 DIAGNOSIS — M961 Postlaminectomy syndrome, not elsewhere classified: Secondary | ICD-10-CM | POA: Insufficient documentation

## 2020-06-10 DIAGNOSIS — D6489 Other specified anemias: Secondary | ICD-10-CM

## 2020-06-10 LAB — CBC WITH DIFFERENTIAL/PLATELET
Abs Immature Granulocytes: 0.13 10*3/uL — ABNORMAL HIGH (ref 0.00–0.07)
Basophils Absolute: 0.1 10*3/uL (ref 0.0–0.1)
Basophils Relative: 1 %
Eosinophils Absolute: 0.1 10*3/uL (ref 0.0–0.5)
Eosinophils Relative: 1 %
HCT: 35 % — ABNORMAL LOW (ref 39.0–52.0)
Hemoglobin: 10.7 g/dL — ABNORMAL LOW (ref 13.0–17.0)
Immature Granulocytes: 1 %
Lymphocytes Relative: 19 %
Lymphs Abs: 2 10*3/uL (ref 0.7–4.0)
MCH: 27 pg (ref 26.0–34.0)
MCHC: 30.6 g/dL (ref 30.0–36.0)
MCV: 88.2 fL (ref 80.0–100.0)
Monocytes Absolute: 1.2 10*3/uL — ABNORMAL HIGH (ref 0.1–1.0)
Monocytes Relative: 11 %
Neutro Abs: 7.2 10*3/uL (ref 1.7–7.7)
Neutrophils Relative %: 67 %
Platelets: 338 10*3/uL (ref 150–400)
RBC: 3.97 MIL/uL — ABNORMAL LOW (ref 4.22–5.81)
RDW: 16.8 % — ABNORMAL HIGH (ref 11.5–15.5)
WBC: 10.8 10*3/uL — ABNORMAL HIGH (ref 4.0–10.5)
nRBC: 0 % (ref 0.0–0.2)

## 2020-06-10 LAB — IRON AND TIBC
Iron: 72 ug/dL (ref 45–182)
Saturation Ratios: 18 % (ref 17.9–39.5)
TIBC: 396 ug/dL (ref 250–450)
UIBC: 324 ug/dL

## 2020-06-10 LAB — COMPREHENSIVE METABOLIC PANEL
ALT: 26 U/L (ref 0–44)
AST: 19 U/L (ref 15–41)
Albumin: 3.8 g/dL (ref 3.5–5.0)
Alkaline Phosphatase: 106 U/L (ref 38–126)
Anion gap: 9 (ref 5–15)
BUN: 14 mg/dL (ref 8–23)
CO2: 20 mmol/L — ABNORMAL LOW (ref 22–32)
Calcium: 9.1 mg/dL (ref 8.9–10.3)
Chloride: 104 mmol/L (ref 98–111)
Creatinine, Ser: 1.01 mg/dL (ref 0.61–1.24)
GFR, Estimated: >60 mL/min (ref >60)
Glucose, Bld: 114 mg/dL — ABNORMAL HIGH (ref 70–99)
Potassium: 4 mmol/L (ref 3.5–5.1)
Sodium: 133 mmol/L — ABNORMAL LOW (ref 135–145)
Total Bilirubin: 0.4 mg/dL (ref 0.3–1.2)
Total Protein: 6.7 g/dL (ref 6.5–8.1)

## 2020-06-10 LAB — FOLATE: Folate: 7.4 ng/mL (ref >5.9)

## 2020-06-10 LAB — RETICULOCYTES: RBC.: 3.45 MIL/uL — ABNORMAL LOW (ref 4.22–5.81)

## 2020-06-10 LAB — LACTATE DEHYDROGENASE: LDH: 87 U/L — ABNORMAL LOW (ref 98–192)

## 2020-06-10 LAB — SEDIMENTATION RATE: Sed Rate: 36 mm/hr — ABNORMAL HIGH (ref 0–20)

## 2020-06-10 LAB — VITAMIN B12: Vitamin B-12: 224 pg/mL (ref 180–914)

## 2020-06-10 LAB — FERRITIN: Ferritin: 70 ng/mL (ref 24–336)

## 2020-06-10 LAB — DAT, POLYSPECIFIC AHG (ARMC ONLY): Polyspecific AHG test: NEGATIVE

## 2020-06-10 NOTE — Progress Notes (Signed)
Patient wife states he fell end at the end of April and lost a quart of blood. Patient is dizzy, tired and short of breath.

## 2020-06-10 NOTE — Progress Notes (Signed)
[place] at [facility] [address] [place], [place] [address] [phone] (phone) [phone] (fax)  Clinic Day:  06/10/2020  Referring physician: Idelle Crouch, MD   CHIEF COMPLAINT:  CC: Anemia  HISTORY OF PRESENT ILLNESS:  William Keith is a 80 y.o. male with a history of A. 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 Doy Hutching for assessment and management of anemia.   Patient has history of hypotension, near syncope and was seen in ER. Symptoms briefly improved after IV fluids then he fell again and suffered scalp wound with bleeding which required suture. Hemoglobin was noted to have dropped from 9.7 to 8.6 thought to be related to IV fluids, decreased hemoconcentration. Stool guaiac was negative. He had positive orthostatic vital signs.   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  IgM-K confirmed in 08/2013.  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 CT was negative for metastatic bone lesions.  No lymphadenopathy.  Repeat IgM was not elevated.  UPEP was negative.  No M spike.  JAK2 was negative.  Serum EPO was 14 (2-13 mIU/mL).  He underwent a bone marrow biopsy on 09/05/2013.  Bone marrow was mildly hypercellular however, no other morphologic features suggestive of a mild proliferative neoplasm  Patient and his wife complain of leg weakness and balance problems for many years. Dizziness when changing positions and falls is new. Patient is unaware of history of  anemia and wife felt it had been related to blood lost after he suffered head wound. He is no longer on eliquis. He denies fevers, chills, recent infections. Denies abnormal weight loss. Appetite is good. Denies cough, chest pain, or hemoptysis. Denies rash or bruising. No nausea, vomiting, constipation, or diarrhea. No urinary complaints. No other specific complaints today.   REVIEW OF SYSTEMS:  Review of Systems  Constitutional:  Positive for fatigue. Negative for appetite change, chills and unexpected weight change.  HENT:   Negative for mouth sores, sore throat, trouble swallowing and voice change.   Respiratory:  Negative for chest tightness and shortness of breath.   Cardiovascular:  Negative for leg swelling.  Gastrointestinal:  Negative for abdominal pain, blood in stool, constipation, diarrhea, nausea and vomiting.  Genitourinary:  Negative for bladder incontinence, dysuria and hematuria.   Musculoskeletal:  Positive for arthralgias. Negative for flank pain and neck stiffness.       Weakness of legs  Skin:  Negative for itching, rash and wound.  Neurological:  Positive for headaches (due to scalp wound). Negative for dizziness, light-headedness and numbness.  Hematological:  Negative for adenopathy. Does not bruise/bleed easily.  Psychiatric/Behavioral:  Positive for depression. Negative for confusion and sleep disturbance. The patient is not nervous/anxious.    VITALS:  Blood pressure (!) 112/53, pulse 88, temperature 97.8 F (36.6 C), resp. rate 20, weight 226 lb 4.8 oz (102.6 kg), SpO2 100 %.  Wt Readings from Last 3 Encounters:  06/10/20 226 lb 4.8 oz (102.6 kg)  05/13/20 220 lb (99.8 kg)  05/12/20 230 lb (104.3 kg)    Body mass index is 34.41 kg/m.  Performance status (ECOG): 2 - Symptomatic, <50% confined to bed  PHYSICAL EXAM:  Physical Exam Vitals reviewed.  Constitutional:      Appearance: He is obese.     Comments: Accompanied by wife; fatigued appearing  HENT:      Head: Normocephalic and atraumatic.  Eyes:     Conjunctiva/sclera: Conjunctivae normal.  Cardiovascular:     Rate and Rhythm: Normal rate and regular rhythm.     Heart sounds: Normal heart sounds.  Pulmonary:     Effort: Pulmonary effort is normal. No respiratory distress.     Breath sounds: Normal breath sounds.  Abdominal:     General: Abdomen is flat. There is no distension.     Tenderness: There is no abdominal tenderness. There is no guarding.  Musculoskeletal:        General: No deformity. Normal range of motion.     Comments: In wheelchair  Lymphadenopathy:     Cervical: No cervical adenopathy.  Skin:    General: Skin is warm and dry.     Findings: No bruising.  Neurological:     General: No focal deficit present.     Mental Status: He is alert and oriented to person, place, and time.  Psychiatric:        Mood and Affect: Mood normal.        Behavior: Behavior normal.    LABS:   CBC Latest Ref Rng & Units 05/13/2020 05/12/2020 05/11/2020  WBC 4.0 - 10.5 K/uL 11.3(H) 14.0(H) 13.8(H)  Hemoglobin 13.0 - 17.0 g/dL 8.6(L) 9.7(L) 9.5(L)  Hematocrit 39.0 - 52.0 % 26.3(L) 30.5(L) 29.9(L)  Platelets 150 - 400 K/uL 240 256 219   CMP Latest Ref Rng & Units 05/13/2020 05/12/2020 05/11/2020  Glucose 70 - 99 mg/dL 119(H) 148(H) 130(H)  BUN 8 - 23 mg/dL 12 12 11   Creatinine 0.61 - 1.24 mg/dL 0.89 0.92 0.78  Sodium 135 - 145 mmol/L 131(L) 130(L) 134(L)  Potassium 3.5 - 5.1 mmol/L 4.4 4.3 4.1  Chloride 98 - 111 mmol/L 101 99 105  CO2 22 - 32 mmol/L 24 23 24   Calcium 8.9 - 10.3 mg/dL 8.7(L) 9.1 8.9  Total Protein 6.5 - 8.1 g/dL - - -  Total Bilirubin 0.3 - 1.2 mg/dL - - -  Alkaline Phos 38 - 126 U/L - - -  AST 15 - 41 U/L - - -  ALT 0 - 44 U/L - - -   No results found for: CEA1 / No results found for: CEA1 No results found for: PSA1 No results found for: NLZ767 No results found for: HAL937  No results found for: TOTALPROTELP, ALBUMINELP, A1GS, A2GS, BETS, BETA2SER, GAMS,  MSPIKE, SPEI No results found for: TIBC, FERRITIN, IRONPCTSAT No results found for: LDH  STUDIES:  DG Chest Portable 1 View  Result Date: 05/13/2020 CLINICAL DATA:  Shortness of breath EXAM: PORTABLE CHEST 1 VIEW COMPARISON:  May 12, 2020 FINDINGS: There has been interval clearing of apparent atelectasis from the left base. Currently no edema or airspace opacity. Heart size and pulmonary vascularity are normal. No adenopathy. No bone lesions. IMPRESSION: Lungs clear.  Cardiac silhouette within normal limits. Electronically Signed   By: Lowella Grip III M.D.   On: 05/13/2020 16:54   DG Chest Port 1 View  Result Date: 05/12/2020 CLINICAL DATA:  Hypotension and dizziness starting today EXAM: PORTABLE CHEST 1 VIEW COMPARISON:  Portable exam 1456 hours compared to 12/13/2019 FINDINGS: Normal heart size, mediastinal contours, and pulmonary vascularity. Increased opacity at  medial LEFT lung base question atelectasis versus consolidation. Remaining lungs clear. No pleural effusion or pneumothorax. Prior cervical spine fusion. IMPRESSION: Question atelectasis versus consolidation LEFT lower lobe. Electronically Signed   By: Lavonia Dana M.D.   On: 05/12/2020 15:21   DG Abd Portable 2 Views  Result Date: 05/12/2020 CLINICAL DATA:  Abdominal distension. EXAM: PORTABLE ABDOMEN - 2 VIEW COMPARISON:  09/27/2018 FINDINGS: There is no bowel dilation to suggest obstruction. No free air. Soft tissues poorly defined, grossly unremarkable. No acute skeletal abnormality. IMPRESSION: 1. No acute findings. No bowel dilation to suggest obstruction. No free air. Electronically Signed   By: Lajean Manes M.D.   On: 05/12/2020 16:29     HISTORY:   Past Medical History:  Diagnosis Date   Atrial fibrillation ([place])    Diabetes mellitus without complication ([place])    Dysrhythmia    Hypercholesteremia    Hypertension    Neck pain    Stroke [facility])     Past Surgical History:  Procedure Laterality Date   NO PAST  SURGERIES      Family History  Problem Relation Age of Onset   Lung cancer Mother     Social History:  reports that he has been smoking pipe. He has never used smokeless tobacco. He reports previous alcohol use. He reports that he does not use drugs.The patient is accompanied by his wife today.  Allergies:  Allergies  Allergen Reactions   Dilaudid [Hydromorphone Hcl] Other (See Comments)    hallucination    Current Medications: Current Outpatient Medications  Medication Sig Dispense Refill   ARIPiprazole (ABILIFY) 5 MG tablet Take 1 tablet by mouth daily.     atenolol (TENORMIN) 50 MG tablet Take 50 mg by mouth daily.     buPROPion (WELLBUTRIN SR) 150 MG 12 hr tablet Take 150 mg by mouth 2 (two) times daily.     Cholecalciferol (D2000 ULTRA STRENGTH) 50 MCG (2000 UT) CAPS Take 2,000 Units by mouth daily.     DULoxetine (CYMBALTA) 60 MG capsule Take 2 capsules by mouth daily.     ezetimibe (ZETIA) 10 MG tablet Take 1 tablet by mouth daily.     gabapentin (NEURONTIN) 300 MG capsule Take 300 mg by mouth 3 (three) times daily.     Multiple Vitamins-Minerals (MULTIVITAMIN WITH MINERALS) tablet Take 1 tablet by mouth at bedtime.     nitroGLYCERIN (NITROLINGUAL) 0.4 MG/SPRAY spray Place 1 spray under the tongue every 5 (five) minutes x 3 doses as needed for chest pain.     omeprazole (PRILOSEC) 40 MG capsule Take 40 mg by mouth daily.     oxyCODONE-acetaminophen (PERCOCET/ROXICET) 5-325 MG tablet Take 1 tablet by mouth every 6 (six) hours as needed for moderate pain.     tamsulosin (FLOMAX) 0.4 MG CAPS capsule Take 0.4 mg by mouth at bedtime.     topiramate (TOPAMAX) 50 MG tablet Take 50 mg by mouth at bedtime.     vitamin A 3 MG (10000 UNITS) capsule Take 10,000 Units by mouth daily.     acetaminophen (TYLENOL) 325 MG tablet Take 650 mg by mouth every 6 (six) hours as needed for mild pain, fever or headache. (Patient not taking: No sig reported)     amitriptyline (ELAVIL) 25 MG tablet  Take 25 mg by mouth at bedtime.     apixaban (ELIQUIS) 5 MG TABS tablet Take 1 tablet by mouth 2 (two) times daily. (Patient not taking: Reported on 06/10/2020)     atorvastatin (LIPITOR)  80 MG tablet Take 80 mg by mouth daily. (Patient not taking: Reported on 06/10/2020)     losartan (COZAAR) 50 MG tablet Take 50 mg by mouth daily. (Patient not taking: Reported on 06/10/2020)     No current facility-administered medications for this visit.     ASSESSMENT & PLAN:   Assessment:  William Keith is a 80 y.o. male who presents as new patient for evaluation and management of anemia.   1.  Anemia- etiology unclear. Hemoglobin roughly normal from 2020 to 2022. Dipped to 11.6 in May 2022, down trended to 8.6 May 13, 2020. Hemoglobin 10.5 on last check, normocytic, hypochromic. Denies obvious bleeding. Eliquis has been discontinued. Symptomatic with fatigue and weakness though I suspect may be related to medical comorbidities and medications (cardiac). Labs today: cbc, cmp, hgb fractionation cascade, b12, folate, ferritin, iron, ana, protein electrophoresis, DAT, haptoglobin, EPO, LDH, sed rate, stool guaiac, reticulocytes.  Given recent history of blood loss question if possible iron deficiency. Given drop in hemoglobin will have him return to clinic in one week for discussion of results and possible iron. Will add venofer to treatment plan based on patient's insurance.   2. Orthostasis - recommend patient follow up with cardiology  3. Falls- recurrent falls. Fall precautions reviewed.   Plan:  RTC in 1 week for lab, MD, discussion of results and possible venofer  I discussed the assessment and treatment plan with the patient.  The patient was provided an opportunity to ask questions and all were answered.  The patient agreed with the plan and demonstrated an understanding of the instructions.  The patient was advised to call back if the symptoms worsen or if the condition fails to improve as  anticipated.  Thank you for the opportunity to participate in the care of this very pleasant patient.   I provided 40 minutes of face-to-face time during this this encounter and > 50% was spent counseling as documented under my assessment and plan.   Verlon Au, NP  CC: Dr. Doy Hutching

## 2020-06-11 ENCOUNTER — Telehealth: Payer: Self-pay | Admitting: Nurse Practitioner

## 2020-06-11 LAB — PROTEIN ELECTROPHORESIS, SERUM
A/G Ratio: 1.3 (ref 0.7–1.7)
Albumin ELP: 3.4 g/dL (ref 2.9–4.4)
Alpha-1-Globulin: 0.2 g/dL (ref 0.0–0.4)
Alpha-2-Globulin: 0.9 g/dL (ref 0.4–1.0)
Beta Globulin: 0.9 g/dL (ref 0.7–1.3)
Gamma Globulin: 0.7 g/dL (ref 0.4–1.8)
Globulin, Total: 2.7 g/dL (ref 2.2–3.9)
M-Spike, %: 0.2 g/dL — ABNORMAL HIGH
Total Protein ELP: 6.1 g/dL (ref 6.0–8.5)

## 2020-06-11 LAB — HAPTOGLOBIN: Haptoglobin: 207 mg/dL (ref 34–355)

## 2020-06-11 LAB — ANA W/REFLEX: Anti Nuclear Antibody (ANA): NEGATIVE

## 2020-06-11 LAB — ERYTHROPOIETIN: Erythropoietin: 41.6 m[IU]/mL — ABNORMAL HIGH (ref 2.6–18.5)

## 2020-06-11 NOTE — Telephone Encounter (Signed)
Left VM with patient to reschedule his appt on 6/14 per his request via [place].

## 2020-06-12 LAB — HGB FRACTIONATION CASCADE
Hgb A2: 2.4 % (ref 1.8–3.2)
Hgb A: 97.6 % (ref 96.4–98.8)
Hgb F: 0 % (ref 0.0–2.0)
Hgb S: 0 %

## 2020-06-16 ENCOUNTER — Encounter: Payer: Self-pay | Admitting: Nurse Practitioner

## 2020-06-16 DIAGNOSIS — D649 Anemia, unspecified: Secondary | ICD-10-CM | POA: Insufficient documentation

## 2020-06-17 ENCOUNTER — Inpatient Hospital Stay: Payer: Medicare Other | Admitting: Nurse Practitioner

## 2020-06-17 ENCOUNTER — Inpatient Hospital Stay: Payer: Medicare Other

## 2020-06-18 LAB — KAPPA/LAMBDA LIGHT CHAINS
Kappa free light chain: 37.8 mg/L — ABNORMAL HIGH (ref 3.3–19.4)
Kappa, lambda light chain ratio: 1.85 — ABNORMAL HIGH (ref 0.26–1.65)
Lambda free light chains: 20.4 mg/L (ref 5.7–26.3)

## 2020-06-19 ENCOUNTER — Other Ambulatory Visit: Payer: Self-pay

## 2020-06-19 ENCOUNTER — Other Ambulatory Visit: Payer: Self-pay | Admitting: *Deleted

## 2020-06-19 ENCOUNTER — Inpatient Hospital Stay (HOSPITAL_BASED_OUTPATIENT_CLINIC_OR_DEPARTMENT_OTHER): Payer: Medicare Other | Admitting: Nurse Practitioner

## 2020-06-19 ENCOUNTER — Ambulatory Visit: Payer: Medicare Other | Admitting: Nurse Practitioner

## 2020-06-19 ENCOUNTER — Inpatient Hospital Stay: Payer: Medicare Other

## 2020-06-19 ENCOUNTER — Other Ambulatory Visit: Payer: Self-pay | Admitting: Nurse Practitioner

## 2020-06-19 ENCOUNTER — Telehealth: Payer: Self-pay | Admitting: *Deleted

## 2020-06-19 DIAGNOSIS — D472 Monoclonal gammopathy: Secondary | ICD-10-CM

## 2020-06-19 DIAGNOSIS — D6489 Other specified anemias: Secondary | ICD-10-CM

## 2020-06-19 DIAGNOSIS — D649 Anemia, unspecified: Secondary | ICD-10-CM | POA: Diagnosis not present

## 2020-06-19 LAB — IMMUNOGLOBULINS A/E/G/M, SERUM
IgA: 141 mg/dL (ref 61–437)
IgE (Immunoglobulin E), Serum: 84 IU/mL (ref 6–495)
IgG (Immunoglobin G), Serum: 483 mg/dL — ABNORMAL LOW (ref 603–1613)
IgM (Immunoglobulin M), Srm: 262 mg/dL — ABNORMAL HIGH (ref 15–143)

## 2020-06-19 LAB — OCCULT BLOOD X 1 CARD TO LAB, STOOL
Fecal Occult Bld: NEGATIVE
Fecal Occult Bld: NEGATIVE
Fecal Occult Bld: NEGATIVE

## 2020-06-19 NOTE — Telephone Encounter (Signed)
Faxed ct guided bone marrow worksheet to specialty scheduling per v/o Lauren.  Pt to f/u for bone marrow bx results in approx. 2 weeks

## 2020-06-19 NOTE — Progress Notes (Signed)
Progress Note [facility] at [facility] [address] [place], [place] [address] [phone] (phone) [phone] (fax)  Virtual Visit Progress Note  I connected with William Keith on 06/19/20 at 11:30 AM EDT by video enabled telemedicine visit and verified that I am speaking with the correct person using two identifiers.   I discussed the limitations, risks, security and privacy concerns of performing an evaluation and management service by telemedicine and the availability of in-person appointments. I also discussed with the patient that there may be a patient responsible charge related to this service. The patient expressed understanding and agreed to proceed.   Other persons participating in the visit and their role in the encounter: Gwinda Passe, patient's wife   Patient's location: home  Provider's location: clinic   Chief Complaint: anemia  Clinic Day:  06/19/2020  Referring physician: Idelle Crouch, MD   CHIEF COMPLAINT:  CC: Anemia  HISTORY OF PRESENT ILLNESS:  William Keith is a 80 y.o. male with a history of A. 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 Doy Hutching for assessment and management of anemia.   Patient has history of hypotension, near syncope and was seen in ER. Symptoms briefly improved after IV fluids then he fell again and suffered scalp wound with bleeding which required suture. Hemoglobin was noted to have dropped from 9.7 to 8.6 thought to be related to IV fluids, decreased hemoconcentration. Stool guaiac was negative. He had positive orthostatic vital signs.   He previously saw hematology at [facility] in 2015 and was diagnosed with a monoclonal IgM kappa 0.09 g/dL and  possible lambda light chains and serum in June 2015.  IgM-K confirmed in 08/2013.  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 CT was negative for metastatic bone lesions.  No lymphadenopathy.  Repeat IgM was not elevated.  UPEP was negative.  No M spike.  JAK2 was negative.  Serum EPO was 14 (2-13 mIU/mL).  He underwent a bone marrow biopsy on 09/05/2013.  Bone marrow was mildly hypercellular however, no other morphologic features suggestive of a mild proliferative neoplasm.   Patient and his wife complain of leg weakness and balance problems for many years. Dizziness when changing positions and falls is new. Patient is unaware of history of anemia and wife felt it had been related to blood lost after he suffered head wound. He is no longer on eliquis.    Interval History:   Patient returns to clinic for discussion of results. He is accompanied by his wife . Continues to feel tired. Has aches and pains. Feels that his legs are weak. No fevers or chills. No additional bleeding or falls.    REVIEW OF SYSTEMS:  Review of Systems  Constitutional:  Positive for fatigue. Negative for appetite change, chills and unexpected weight change.  HENT:   Negative for mouth sores, sore throat, trouble swallowing and voice change.   Respiratory:  Negative for chest tightness and shortness of breath.   Cardiovascular:  Negative for leg swelling.  Gastrointestinal:  Negative for abdominal pain, blood in stool, constipation, diarrhea, nausea and vomiting.  Genitourinary:  Negative for bladder incontinence, dysuria and hematuria.   Musculoskeletal:  Positive for arthralgias. Negative for flank pain  and neck stiffness.       Weakness of legs  Skin:  Negative for itching, rash and wound.  Neurological:  Negative for dizziness, headaches, light-headedness and numbness.  Hematological:  Negative for adenopathy.  Does not bruise/bleed easily.  Psychiatric/Behavioral:  Positive for depression. Negative for confusion and sleep disturbance. The patient is not nervous/anxious.    VITALS:  There were no vitals taken for this visit.  Wt Readings from Last 3 Encounters:  06/10/20 226 lb 4.8 oz (102.6 kg)  05/13/20 220 lb (99.8 kg)  05/12/20 230 lb (104.3 kg)    There is no height or weight on file to calculate BMI.  Performance status (ECOG): 2 - Symptomatic, <50% confined to bed  PHYSICAL EXAM:  Physical Exam Constitutional:      Appearance: He is not ill-appearing.     Comments: Accompanied by wife  Pulmonary:     Effort: Pulmonary effort is normal. No respiratory distress.  Skin:    Coloration: Skin is not pale.  Neurological:     Mental Status: He is alert and oriented to person, place, and time.  Psychiatric:        Mood and Affect: Mood normal.        Behavior: Behavior normal.    LABS:   CBC Latest Ref Rng & Units 06/10/2020 05/13/2020 05/12/2020  WBC 4.0 - 10.5 K/uL 10.8(H) 11.3(H) 14.0(H)  Hemoglobin 13.0 - 17.0 g/dL 10.7(L) 8.6(L) 9.7(L)  Hematocrit 39.0 - 52.0 % 35.0(L) 26.3(L) 30.5(L)  Platelets 150 - 400 K/uL 338 240 256   CMP Latest Ref Rng & Units 06/10/2020 05/13/2020 05/12/2020  Glucose 70 - 99 mg/dL 114(H) 119(H) 148(H)  BUN 8 - 23 mg/dL _0 Creatinine 0.61 - 1.24 mg/dL 1.01 0.89 0.92  Sodium 135 - 145 mmol/L 133(L) 131(L) 130(L)  Potassium 3.5 - 5.1 mmol/L 4.0 4.4 4.3  Chloride 98 - 111 mmol/L 104 101 99  CO2 22 - 32 mmol/L 20(L) 24 23  Calcium 8.9 - 10.3 mg/dL 9.1 8.7(L) 9.1  Total Protein 6.5 - 8.1 g/dL 6.7 - -  Total Bilirubin 0.3 - 1.2 mg/dL 0.4 - -  Alkaline Phos 38 - 126 U/L 106 - -  AST 15 - 41 U/L 19 - -  ALT 0 - 44 U/L 26 - -   No results found for: CEA1 / No results found for: CEA1 No results found for: PSA1 No results found for: VQM086 No results found for: PYP950  Lab Results  Component Value Date   TOTALPROTELP 6.1 06/10/2020   ALBUMINELP 3.4  06/10/2020   A1GS 0.2 06/10/2020   A2GS 0.9 06/10/2020   BETS 0.9 06/10/2020   GAMS 0.7 06/10/2020   MSPIKE 0.2 (H) 06/10/2020   SPEI Comment 06/10/2020   Lab Results  Component Value Date   TIBC 396 06/10/2020   FERRITIN 70 06/10/2020   IRONPCTSAT 18 06/10/2020   Lab Results  Component Value Date   LDH 87 (L) 06/10/2020    STUDIES:  No results found.    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 He has never used smokeless tobacco. He reports previous alcohol use. He reports that he does not use drugs.The patient is accompanied by his wife today.  Allergies:  Allergies  Allergen Reactions   Dilaudid [Hydromorphone Hcl] Other (See Comments)    hallucination    Current Medications: Current Outpatient Medications  Medication Sig Dispense Refill   acetaminophen (TYLENOL) 325 MG tablet Take 650 mg by mouth every 6 (six) hours as needed for mild pain, fever or headache. (Patient not taking: No sig reported)     amitriptyline (ELAVIL) 25 MG tablet Take 25 mg by mouth at bedtime.     ARIPiprazole (ABILIFY) 5 MG tablet Take 1 tablet by mouth daily.     atenolol (TENORMIN) 50 MG tablet Take 50 mg by mouth daily.     atorvastatin (LIPITOR) 80 MG tablet Take 80 mg by mouth daily. (Patient not taking: Reported on 06/10/2020)     buPROPion (WELLBUTRIN SR) 150 MG 12 hr tablet Take 150 mg by mouth 2 (two) times daily.     Cholecalciferol (D2000 ULTRA STRENGTH) 50 MCG (2000 UT) CAPS Take 2,000 Units by mouth daily.     desonide (DESOWEN)  0.05 % cream Apply topically.     DULoxetine (CYMBALTA) 60 MG capsule Take 2 capsules by mouth daily.     ezetimibe (ZETIA) 10 MG tablet Take 1 tablet by mouth daily.     gabapentin (NEURONTIN) 300 MG capsule Take 300 mg by mouth 3 (three) times daily.     losartan (COZAAR) 50 MG tablet Take 50 mg by mouth daily. (Patient not taking: Reported on 06/10/2020)     modafinil (PROVIGIL) 200 MG tablet Take by mouth.     Multiple Vitamins-Minerals (MULTIVITAMIN WITH MINERALS) tablet Take 1 tablet by mouth at bedtime.     nitroGLYCERIN (NITROLINGUAL) 0.4 MG/SPRAY spray Place 1 spray under the tongue every 5 (five) minutes x 3 doses as needed for chest pain.     omeprazole (PRILOSEC) 40 MG capsule Take 40 mg by mouth daily.     oxyCODONE-acetaminophen (PERCOCET/ROXICET) 5-325 MG tablet Take 1 tablet by mouth every 6 (six) hours as needed for moderate pain.     tamsulosin (FLOMAX) 0.4 MG CAPS capsule Take 0.4 mg by mouth at bedtime.     topiramate (TOPAMAX) 50 MG tablet Take 50 mg by mouth at bedtime.     vitamin A 3 MG (10000 UNITS) capsule Take 10,000 Units by mouth daily.     No current facility-administered medications for this visit.     ASSESSMENT & PLAN:   Assessment:  William Keith is a 80 y.o. male with anemia, who returns to clinic for discussion of results  1.  Anemia- etiology unclear- initial workup. Hemoglobin 10.7 which is overall down from past few years. WBC elevated. M spike present 0.2%. Kappa lamda light chain ratio elevated at 1.85. EPO elevated appropriately. LDH decreased. Iron saturation low normal, ferritin normal. No indication for Iron today. Sed rate elevated. Calcium normal. Creatinine normal. Chronic arthralgias. Based on results, recommend checking MM/MGUS labs today. Also recommend a bone marrow biopsy to evaluate for possible MDS. Case discussed with Dr. Rogue Bussing who contributed to plan of care. Discussed potential risks of bone marrow biopsy including but not limited  to, bleeding, infection, and pain. Patient wishes to proceed.  2. Orthostasis - improved. He is holding BP  medication per cardiology  3. Falls- recurrent falls. No interval falls  Plan:  Lab today Bone Marrow biopsy See Dr. Rogue Bussing 2-3 weeks post BMB for results   I discussed the assessment and treatment plan with the patient. The patient was provided an opportunity to ask questions and all were answered. The patient agreed with the plan and demonstrated an understanding of the instructions.   The patient was advised to call back or seek an in-person evaluation if the symptoms worsen or if the condition fails to improve as anticipated.   I spent 20 minutes face-to-face video visit time dedicated to the care of this patient on the date of this encounter to include pre-visit review of <specify which records>, face-to-face time with the patient, and post visit ordering of testing/documentation.   Thank you for the opportunity to participate in the care of this very pleasant patient.   Verlon Au, NP  CC: Dr. Doy Hutching

## 2020-06-20 ENCOUNTER — Telehealth: Payer: Self-pay | Admitting: *Deleted

## 2020-06-20 LAB — KAPPA/LAMBDA LIGHT CHAINS
Kappa free light chain: 33.6 mg/L — ABNORMAL HIGH (ref 3.3–19.4)
Kappa, lambda light chain ratio: 1.85 — ABNORMAL HIGH (ref 0.26–1.65)
Lambda free light chains: 18.2 mg/L (ref 5.7–26.3)

## 2020-06-20 LAB — MISC LABCORP TEST (SEND OUT): Labcorp test code: 14300

## 2020-06-20 NOTE — Telephone Encounter (Signed)
Contacted patient. Spoke with pt's wife. 06/20/20. - CT Bone Marrow Biopsy Tues 06/24/20 @8 :30a  Arrive @7 :30am. Reviewed instructions with wife. Pt will remain NPO 6-8 hours prior to procedure.

## 2020-06-23 ENCOUNTER — Encounter: Payer: Self-pay | Admitting: Nurse Practitioner

## 2020-06-23 ENCOUNTER — Other Ambulatory Visit: Payer: Self-pay | Admitting: Student

## 2020-06-23 LAB — MULTIPLE MYELOMA PANEL, SERUM
Albumin SerPl Elph-Mcnc: 3.6 g/dL (ref 2.9–4.4)
Albumin/Glob SerPl: 1.5 (ref 0.7–1.7)
Alpha 1: 0.2 g/dL (ref 0.0–0.4)
Alpha2 Glob SerPl Elph-Mcnc: 0.8 g/dL (ref 0.4–1.0)
B-Globulin SerPl Elph-Mcnc: 0.8 g/dL (ref 0.7–1.3)
Gamma Glob SerPl Elph-Mcnc: 0.6 g/dL (ref 0.4–1.8)
Globulin, Total: 2.5 g/dL (ref 2.2–3.9)
IgA: 125 mg/dL (ref 61–437)
IgG (Immunoglobin G), Serum: 438 mg/dL — ABNORMAL LOW (ref 603–1613)
IgM (Immunoglobulin M), Srm: 230 mg/dL — ABNORMAL HIGH (ref 15–143)
M Protein SerPl Elph-Mcnc: 0.2 g/dL — ABNORMAL HIGH
Total Protein ELP: 6.1 g/dL (ref 6.0–8.5)

## 2020-06-23 NOTE — H&P (Signed)
Chief Complaint: Patient was seen in consultation today for bone marrow biopsy with aspiration   Referring Physician(s): Crenshaw G  Supervising Physician: Corrie Mckusick  Patient Status: [facility] - Out-pt  History of Present Illness: William Keith is an 80 y.o. male with a medical history significant for DM, HTN, atrial fibrillation s/p ablation (no blood thinners), stroke, depression, obesity, OSA and MGUS. He also has a history of sensorimotor polyneuropathy with recurrent falls. 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 In the past few months the patient has been experiencing increased fatigue and arthralgias. Recent lab work is positive for worsening anemia, elevated WBC, presence of M spike and kappa lambda light chain ratio elevation.   Interventional Radiology has been asked to evaluate this patient for an image-guided bone marrow biopsy with aspiration for further work up of possible MDS.    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 Authorizing Provider  amitriptyline (ELAVIL) 25 MG tablet Take 25 mg by mouth at bedtime. 04/15/20   [provider]  ARIPiprazole (ABILIFY) 5 MG tablet Take 1 tablet by mouth daily. 12/02/19   [provider]  atenolol (TENORMIN) 50 MG tablet Take 50 mg by mouth daily. 11/06/19   [provider]  atorvastatin (LIPITOR) 80 MG tablet Take 80 mg by mouth daily. Patient not  taking: Reported on 06/10/2020 08/01/18   [provider]  buPROPion (WELLBUTRIN SR) 150 MG 12 hr tablet Take 150 mg by mouth 2 (two) times daily. 07/08/18   [provider]  Cholecalciferol (D2000 ULTRA STRENGTH) 50 MCG (2000 UT) CAPS Take 2,000 Units by mouth daily.    [provider]  desonide (DESOWEN) 0.05 % cream Apply topically. 05/07/13   [provider]  DULoxetine (CYMBALTA) 60 MG capsule Take 2 capsules by mouth daily. 10/31/19   [provider]  ezetimibe (ZETIA) 10 MG tablet Take 1 tablet by mouth daily. 09/07/17   [provider]  gabapentin (NEURONTIN) 300 MG capsule Take 300 mg by mouth 3 (three) times daily. 04/16/20   [provider]  modafinil (PROVIGIL) 200 MG tablet Take by mouth.    [provider]  Multiple Vitamins-Minerals (MULTIVITAMIN WITH MINERALS) tablet Take 1 tablet by mouth at bedtime.    [provider]  nitroGLYCERIN (NITROLINGUAL) 0.4 MG/SPRAY spray Place 1 spray under the tongue every 5 (five) minutes x 3 doses as needed for chest pain.    [provider]  omeprazole (PRILOSEC) 40 MG capsule Take 40 mg by mouth daily. 09/06/18   [provider]  oxyCODONE-acetaminophen (PERCOCET/ROXICET) 5-325 MG tablet Take 1 tablet by mouth every 6 (six) hours as needed for moderate pain.    [provider]  tamsulosin (FLOMAX) 0.4 MG CAPS capsule Take 0.4 mg by mouth at bedtime.    [provider]  topiramate (TOPAMAX) 50 MG tablet Take 50 mg by mouth at bedtime. 06/18/18   [provider]  vitamin A 3 MG (10000 UNITS) capsule Take 10,000 Units by mouth daily.    [provider]     Family History  Problem Relation Age of Onset   Lung cancer Mother     Social History   Socioeconomic History   Marital status: Married    Spouse name: Not on file   Number of children: Not on file   Years of education: Not on file   Highest education level: Not on  file  Occupational History   Not on file  Tobacco Use   Smoking status: Every Day    Pack years: 0.00    Types: Pipe   Smokeless tobacco: Never  Substance and Sexual Activity   Alcohol use: Not Currently   Drug use: Never   Sexual activity: Not on file  Other Topics Concern   Not on file  Social History Narrative   Not on file   Social Determinants of Health   Financial Resource Strain: Not on file  Food Insecurity: Not on file  Transportation Needs: Not on file  Physical Activity: Not on file  Stress: Not on file  Social Connections: Not on file    Review of Systems: A 12 point ROS discussed and pertinent positives are indicated in the HPI above.  All other systems are negative.  Review of Systems  Constitutional:  Positive for fatigue. Negative for appetite change.  Respiratory:  Negative for cough and shortness of breath.   Cardiovascular:  Negative for chest pain and leg swelling.  Gastrointestinal:  Negative for abdominal pain, diarrhea, nausea and vomiting.  Skin:  Positive for rash.       Neck area - nervous rash per patient; self-inflicted.   Neurological:  Negative for dizziness and headaches.   Vital Signs: BP (!) 159/81   Pulse 98   Temp 97.8 F (36.6 C) (Oral)   Resp 20   Ht 5' 8"  (1.727 m)   Wt 220 lb (99.8 kg)   SpO2 98%   BMI 33.45 kg/m   Physical Exam Constitutional:      General: He is not in acute distress.    Appearance: He is obese.  HENT:     Mouth/Throat:     Mouth: Mucous membranes are moist.     Pharynx: Oropharynx is clear.  Neck:     Comments: Diffuse redness/dryness to neck area that patient states is from his nervous habit of rubbing the area when he is anxious.  Cardiovascular:     Rate and Rhythm: Normal rate and regular rhythm.     Pulses: Normal pulses.     Heart sounds: Normal heart sounds.  Pulmonary:     Effort: Pulmonary effort is normal.     Breath sounds: Normal breath sounds.  Abdominal:     General: Abdomen is  protuberant. Bowel sounds are normal.     Palpations: Abdomen is soft.  Musculoskeletal:        General: Normal range of motion.  Skin:    General: Skin is warm and dry.  Neurological:     Mental Status: He is alert and oriented to person, place, and time.    Imaging: No results found.  Labs:  CBC: Recent Labs    05/12/20 1433 05/13/20 1610 06/10/20 1202 06/24/20 0750  WBC 14.0* 11.3* 10.8* 10.1  HGB 9.7* 8.6* 10.7* 12.0*  HCT 30.5* 26.3* 35.0* 38.9*  PLT 256 240 338 317    COAGS: Recent Labs    12/13/19  2150 12/14/19 0428 05/10/20 0639 05/10/20 1102  INR 1.1 1.1 1.1  --   APTT 28  --   --  28    BMP: Recent Labs    05/11/20 0152 05/12/20 1433 05/13/20 1610 06/10/20 1202  NA 134* 130* 131* 133*  K 4.1 4.3 4.4 4.0  CL 105 99 101 104  CO2 24 23 24  20*  GLUCOSE 130* 148* 119* 114*  BUN 11 12 12 14   CALCIUM 8.9 9.1 8.7* 9.1  CREATININE 0.78 0.92 0.89 1.01  GFRNONAA >60 >60 >60 >60    LIVER FUNCTION TESTS: Recent Labs    12/13/19 2150 12/14/19 0428 05/10/20 0639 06/10/20 1202  BILITOT 0.8 0.5 0.9 0.4  AST 26 19 19 19   ALT 25 21 18 26   ALKPHOS 106 81 79 106  PROT 6.5 5.5* 5.5* 6.7  ALBUMIN 3.7 3.0* 3.0* 3.8    TUMOR MARKERS: No results for input(s): AFPTM, CEA, CA199, CHROMGRNA in the last 8760 hours.  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   Risks and benefits of a bone marrow biopsy were discussed with the patient and/or patient's family including, but not limited to bleeding, infection, damage to adjacent structures or low yield requiring additional tests.   All of the questions were answered and there is agreement to proceed. He has been NPO. Labs and vitals have been reviewed.   Consent signed and in chart.  Thank you for this interesting  consult.  I greatly enjoyed meeting Amor Packard and look forward to participating in their care.  A copy of this report was sent to the requesting provider on this date.  Electronically Signed: Soyla Dryer, AGACNP-BC [phone] 06/24/2020, 8:26 AM   I spent a total of  30 Minutes   in face to face in clinical consultation, greater than 50% of which was counseling/coordinating care for bone marrow biopsy with aspiration.

## 2020-06-24 ENCOUNTER — Other Ambulatory Visit: Payer: Self-pay

## 2020-06-24 ENCOUNTER — Ambulatory Visit
Admission: RE | Admit: 2020-06-24 | Discharge: 2020-06-24 | Disposition: A | Discharge status: Home / Self Care | Payer: Medicare Other | Source: Ambulatory Visit | Attending: Nurse Practitioner | Admitting: Nurse Practitioner

## 2020-06-24 DIAGNOSIS — F32A Depression, unspecified: Secondary | ICD-10-CM | POA: Insufficient documentation

## 2020-06-24 DIAGNOSIS — Z79899 Other long term (current) drug therapy: Secondary | ICD-10-CM | POA: Insufficient documentation

## 2020-06-24 DIAGNOSIS — D469 Myelodysplastic syndrome, unspecified: Secondary | ICD-10-CM | POA: Diagnosis not present

## 2020-06-24 DIAGNOSIS — Z801 Family history of malignant neoplasm of trachea, bronchus and lung: Secondary | ICD-10-CM | POA: Diagnosis not present

## 2020-06-24 DIAGNOSIS — E669 Obesity, unspecified: Secondary | ICD-10-CM | POA: Diagnosis not present

## 2020-06-24 DIAGNOSIS — Z8673 Personal history of transient ischemic attack (TIA), and cerebral infarction without residual deficits: Secondary | ICD-10-CM | POA: Insufficient documentation

## 2020-06-24 DIAGNOSIS — D649 Anemia, unspecified: Secondary | ICD-10-CM | POA: Diagnosis present

## 2020-06-24 DIAGNOSIS — G4733 Obstructive sleep apnea (adult) (pediatric): Secondary | ICD-10-CM | POA: Diagnosis not present

## 2020-06-24 DIAGNOSIS — F17290 Nicotine dependence, other tobacco product, uncomplicated: Secondary | ICD-10-CM | POA: Insufficient documentation

## 2020-06-24 DIAGNOSIS — Z6833 Body mass index (BMI) 33.0-33.9, adult: Secondary | ICD-10-CM | POA: Diagnosis not present

## 2020-06-24 DIAGNOSIS — R296 Repeated falls: Secondary | ICD-10-CM | POA: Insufficient documentation

## 2020-06-24 DIAGNOSIS — D472 Monoclonal gammopathy: Secondary | ICD-10-CM | POA: Insufficient documentation

## 2020-06-24 DIAGNOSIS — E119 Type 2 diabetes mellitus without complications: Secondary | ICD-10-CM | POA: Diagnosis not present

## 2020-06-24 DIAGNOSIS — I10 Essential (primary) hypertension: Secondary | ICD-10-CM | POA: Diagnosis not present

## 2020-06-24 DIAGNOSIS — G608 Other hereditary and idiopathic neuropathies: Secondary | ICD-10-CM | POA: Diagnosis not present

## 2020-06-24 DIAGNOSIS — Z9181 History of falling: Secondary | ICD-10-CM | POA: Insufficient documentation

## 2020-06-24 LAB — CBC WITH DIFFERENTIAL/PLATELET
Abs Immature Granulocytes: 0.03 10*3/uL (ref 0.00–0.07)
Basophils Absolute: 0.1 10*3/uL (ref 0.0–0.1)
Basophils Relative: 1 %
Eosinophils Absolute: 0.2 10*3/uL (ref 0.0–0.5)
Eosinophils Relative: 2 %
HCT: 38.9 % — ABNORMAL LOW (ref 39.0–52.0)
Hemoglobin: 12 g/dL — ABNORMAL LOW (ref 13.0–17.0)
Immature Granulocytes: 0 %
Lymphocytes Relative: 24 %
Lymphs Abs: 2.4 10*3/uL (ref 0.7–4.0)
MCH: 26.8 pg (ref 26.0–34.0)
MCHC: 30.8 g/dL (ref 30.0–36.0)
MCV: 87 fL (ref 80.0–100.0)
Monocytes Absolute: 1.2 10*3/uL — ABNORMAL HIGH (ref 0.1–1.0)
Monocytes Relative: 12 %
Neutro Abs: 6.2 10*3/uL (ref 1.7–7.7)
Neutrophils Relative %: 61 %
Platelets: 317 10*3/uL (ref 150–400)
RBC: 4.47 MIL/uL (ref 4.22–5.81)
RDW: 16.2 % — ABNORMAL HIGH (ref 11.5–15.5)
WBC: 10.1 10*3/uL (ref 4.0–10.5)
nRBC: 0 % (ref 0.0–0.2)

## 2020-06-24 MED ORDER — FENTANYL CITRATE (PF) 100 MCG/2ML IJ SOLN
INTRAMUSCULAR | Status: AC
  Filled 2020-06-24: qty 2

## 2020-06-24 MED ORDER — HEPARIN SOD (PORK) LOCK FLUSH 100 UNIT/ML IV SOLN
INTRAVENOUS | Status: AC
  Filled 2020-06-24: qty 5

## 2020-06-24 MED ORDER — SODIUM CHLORIDE 0.9 % IV SOLN: INTRAVENOUS | Status: DC

## 2020-06-24 MED ORDER — FENTANYL CITRATE (PF) 100 MCG/2ML IJ SOLN
INTRAMUSCULAR | Status: AC | PRN
  Administered 2020-06-24 (×2): 25 ug via INTRAVENOUS

## 2020-06-24 MED ORDER — MIDAZOLAM HCL 2 MG/2ML IJ SOLN
INTRAMUSCULAR | Status: AC
  Filled 2020-06-24: qty 2

## 2020-06-24 MED ORDER — MIDAZOLAM HCL 2 MG/2ML IJ SOLN
INTRAMUSCULAR | Status: AC | PRN
  Administered 2020-06-24 (×2): 0.5 mg via INTRAVENOUS

## 2020-06-24 NOTE — Procedures (Signed)
Interventional Radiology Procedure Note  Procedure: CT guided aspirate and core biopsy of right posterior iliac bone Complications: None Recommendations: - Bedrest supine x 1 hrs - OTC's PRN  Pain - Follow biopsy results  Signed,  Urias Sheek S. Joshawa Dubin, DO    

## 2020-06-24 NOTE — Progress Notes (Signed)
Patient clinically stable post BMB per Dr Earleen Newport, tolerated well. Received Versed 1 mg along with Fentanyl 50 mcg IV for procedure. Denies complaints at this time. Report given to Fransico Michael RN post procedure in specials.

## 2020-06-26 LAB — SURGICAL PATHOLOGY

## 2020-07-02 ENCOUNTER — Encounter (HOSPITAL_COMMUNITY): Payer: Self-pay | Admitting: Internal Medicine

## 2020-07-10 ENCOUNTER — Other Ambulatory Visit: Payer: Self-pay

## 2020-07-10 ENCOUNTER — Inpatient Hospital Stay: Payer: Medicare Other | Attending: Internal Medicine | Admitting: Internal Medicine

## 2020-07-10 DIAGNOSIS — E119 Type 2 diabetes mellitus without complications: Secondary | ICD-10-CM | POA: Diagnosis not present

## 2020-07-10 DIAGNOSIS — Z79899 Other long term (current) drug therapy: Secondary | ICD-10-CM | POA: Diagnosis not present

## 2020-07-10 DIAGNOSIS — I4891 Unspecified atrial fibrillation: Secondary | ICD-10-CM | POA: Insufficient documentation

## 2020-07-10 DIAGNOSIS — D472 Monoclonal gammopathy: Secondary | ICD-10-CM | POA: Insufficient documentation

## 2020-07-10 DIAGNOSIS — D649 Anemia, unspecified: Secondary | ICD-10-CM | POA: Diagnosis present

## 2020-07-10 DIAGNOSIS — E611 Iron deficiency: Secondary | ICD-10-CM | POA: Diagnosis not present

## 2020-07-10 DIAGNOSIS — Z8673 Personal history of transient ischemic attack (TIA), and cerebral infarction without residual deficits: Secondary | ICD-10-CM | POA: Diagnosis not present

## 2020-07-10 NOTE — Assessment & Plan Note (Addendum)
#  Anemia- etiology unclear- initial workup. Hemoglobin 10.7 which is overall down from past few years. WBC elevated. M spike present 0.2%. Kappa lamda light chain ratio elevated at 1.85. EPO elevated appropriately.  Iron saturation low normal, ferritin normal- June 2022- Bone marrow Bx-negative for any obvious MDS/leukemia/significant plasma cell dyscrasia; The bone marrow is hypercellular for age with trilineage hematopoiesis and nonspecific myeloid changes. No increase in blastic cells  identified. Iron stores are markedly decreased/absent [see below]; 2% plasma cells noted however polytypic in nature [see below] flow cytometry of predominant T cells.  #Given the marked iron deficiency of the bone marrow-recommend iron supplementation.  Given the recent hemoglobin on 12; I think is reasonable to give a trial of oral iron.  If not improved at next visit would recommend IV iron infusion.  Patient daughter in agreement.  #Etiology: Unclear we will consider further evaluation next visit.   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  However June 2022 bone marrow biopsy 2% plasma cells noted/again polytypic.  I discussed the importance of M protein and the rare risk of transformation to multiple myeloma.  Patient is extremely low risk of transformation to myeloma.  # DISPOSITION: # follow in 3 months-MD; labs- cbc/bmp/iron studies/ferritin; possible venofer- Dr.B

## 2020-07-10 NOTE — Progress Notes (Signed)
[name] NOTE  Patient Care Team: William Crouch, MD as PCP - General (Internal Medicine)  CHIEF COMPLAINTS/PURPOSE OF CONSULTATION:  Anemia   # June 2022-bone marrow biopsy-[William Burns, NP]hypercellular but no evidence of dyspoiesis or blasts; low iron content noted.  2% plasma cells-polytypic July 2022-p.o. iron  #June 2022-MGUS M protein 0.2 g.   #A. Fib;  Oncology History   No history exists.     HISTORY OF PRESENTING ILLNESS:  William Keith 80 y.o.  male with multiple medical problems including atrial fibrillation history of stroke-in anemia is here to review the results of his bone marrow biopsy.  Patient denies any blood in stools or black or stools.  Complains of mild to moderate fatigue.  Otherwise denies any chest pain or shortness of breath or cough.   Review of Systems  Constitutional:  Positive for malaise/fatigue and weight loss. Negative for chills, diaphoresis and fever.  HENT:  Negative for nosebleeds and sore throat.   Eyes:  Negative for double vision.  Respiratory:  Positive for shortness of breath. Negative for cough, hemoptysis, sputum production and wheezing.   Cardiovascular:  Negative for chest pain, palpitations, orthopnea and leg swelling.  Gastrointestinal:  Negative for abdominal pain, blood in stool, constipation, diarrhea, heartburn, melena, nausea and vomiting.  Genitourinary:  Negative for dysuria, frequency and urgency.  Musculoskeletal:  Positive for back pain and joint pain.  Skin: Negative.  Negative for itching and rash.  Neurological:  Negative for dizziness, tingling, focal weakness, weakness and headaches.  Endo/Heme/Allergies:  Does not bruise/bleed easily.  Psychiatric/Behavioral:  Negative for depression. The patient is not nervous/anxious and does not have insomnia.     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   Diabetes  mellitus without complication ([facility])    Dysrhythmia    Hypercholesteremia    Hypertension    Neck pain    Postconcussion syndrome 09/01/2018   Stroke [facility])     SURGICAL HISTORY: Past Surgical History:  Procedure Laterality Date   NO PAST SURGERIES      SOCIAL HISTORY: Social History   Socioeconomic History   Marital status: Married    Spouse name: Not on file   Number of children: Not on file   Years of education: Not on file   Highest education level: Not on file  Occupational History   Not on file  Tobacco Use   Smoking status: Every Day    Pack years: 0.00    Types: Pipe   Smokeless tobacco: Never  Substance and Sexual Activity   Alcohol use: Not Currently   Drug use: Never   Sexual activity: Not on file  Other Topics Concern   Not on file  Social History Narrative   Not on file   Social Determinants of Health   Financial Resource Strain: Not on file  Food Insecurity: Not on file  Transportation Needs: Not on file  Physical Activity: Not on file  Stress: Not on file  Social Connections: Not on file  Intimate Partner Violence: Not on file    FAMILY HISTORY: Family History  Problem Relation Age of Onset   Lung cancer Mother     ALLERGIES:  is allergic to dilaudid [hydromorphone hcl].  MEDICATIONS:  Current Outpatient Medications  Medication Sig Dispense Refill   amitriptyline (ELAVIL) 25 MG tablet Take 25 mg by mouth at bedtime.     ARIPiprazole (ABILIFY) 5  MG tablet Take 1 tablet by mouth daily.     atorvastatin (LIPITOR) 80 MG tablet Take 80 mg by mouth daily.     buPROPion (WELLBUTRIN SR) 150 MG 12 hr tablet Take 150 mg by mouth 2 (two) times daily.     Cholecalciferol (D2000 ULTRA STRENGTH) 50 MCG (2000 UT) CAPS Take 2,000 Units by mouth daily.     gabapentin (NEURONTIN) 300 MG capsule Take 300 mg by mouth 3 (three) times daily.     Multiple Vitamins-Minerals (MULTIVITAMIN WITH MINERALS) tablet Take 1 tablet by mouth at bedtime.     omeprazole  (PRILOSEC) 40 MG capsule Take 40 mg by mouth daily.     oxyCODONE-acetaminophen (PERCOCET/ROXICET) 5-325 MG tablet Take 1 tablet by mouth every 6 (six) hours as needed for moderate pain.     tamsulosin (FLOMAX) 0.4 MG CAPS capsule Take 0.4 mg by mouth at bedtime.     topiramate (TOPAMAX) 50 MG tablet Take 50 mg by mouth at bedtime.     atenolol (TENORMIN) 50 MG tablet Take 50 mg by mouth daily.     desonide (DESOWEN) 0.05 % cream Apply topically.     DULoxetine (CYMBALTA) 60 MG capsule Take 2 capsules by mouth daily.     ezetimibe (ZETIA) 10 MG tablet Take 1 tablet by mouth daily.     modafinil (PROVIGIL) 200 MG tablet Take by mouth.     nitroGLYCERIN (NITROLINGUAL) 0.4 MG/SPRAY spray Place 1 spray under the tongue every 5 (five) minutes x 3 doses as needed for chest pain.     vitamin A 3 MG (10000 UNITS) capsule Take 10,000 Units by mouth daily.     No current facility-administered medications for this visit.      Marland Kitchen  PHYSICAL EXAMINATION: ECOG PERFORMANCE STATUS: 2 - Symptomatic, <50% confined to bed  Vitals:   07/10/20 1035  BP: 126/72  Pulse: 97  Resp: 20  Temp: 98.7 F (37.1 C)  SpO2: 97%   Filed Weights   07/10/20 1035  Weight: 224 lb 8 oz (101.8 kg)    Physical Exam Vitals and nursing note reviewed.  Constitutional:      Comments: Accompanied by daughter. In a wheelchair.   HENT:     Head: Normocephalic and atraumatic.     Mouth/Throat:     Pharynx: Oropharynx is clear.  Eyes:     Extraocular Movements: Extraocular movements intact.     Pupils: Pupils are equal, round, and reactive to light.  Cardiovascular:     Rate and Rhythm: Normal rate and regular rhythm.  Pulmonary:     Comments: Decreased breath sounds bilaterally.  Abdominal:     Palpations: Abdomen is soft.  Musculoskeletal:        General: Normal range of motion.     Cervical back: Normal range of motion.  Skin:    General: Skin is warm.  Neurological:     General: No focal deficit present.      Mental Status: He is alert and oriented to person, place, and time.  Psychiatric:        Behavior: Behavior normal.        Judgment: Judgment normal.     LABORATORY DATA:  I have reviewed the data as listed Lab Results  Component Value Date   WBC 10.1 06/24/2020   HGB 12.0 (L) 06/24/2020   HCT 38.9 (L) 06/24/2020   MCV 87.0 06/24/2020   PLT 317 06/24/2020   Recent Labs    12/14/19 0428 12/15/19 0328  05/10/20 0626 05/11/20 0152 05/12/20 1433 05/13/20 1610 06/10/20 1202  NA 137   < > 130*   < > 130* 131* 133*  K 4.1   < > 4.5   < > 4.3 4.4 4.0  CL 105   < > 102   < > 99 101 104  CO2 23   < > 21*   < > 23 24 20*  GLUCOSE 116*   < > 162*   < > 148* 119* 114*  BUN 13   < > 13   < > 12 12 14   CREATININE 1.10   < > 0.87   < > 0.92 0.89 1.01  CALCIUM 8.9   < > 8.4*   < > 9.1 8.7* 9.1  GFRNONAA >60   < > >60   < > >60 >60 >60  PROT 5.5*  --  5.5*  --   --   --  6.7  ALBUMIN 3.0*  --  3.0*  --   --   --  3.8  AST 19  --  19  --   --   --  19  ALT 21  --  18  --   --   --  26  ALKPHOS 81  --  79  --   --   --  106  BILITOT 0.5  --  0.9  --   --   --  0.4   < > = values in this interval not displayed.    RADIOGRAPHIC STUDIES: I have personally reviewed the radiological images as listed and agreed with the findings in the report. CT BONE MARROW BIOPSY & ASPIRATION  Result Date: 06/24/2020 INDICATION: 80 year old male with a history of myelodysplastic syndrome, anemia EXAM: CT BONE MARROW BIOPSY AND ASPIRATION MEDICATIONS: None. ANESTHESIA/SEDATION: Moderate (conscious) sedation was employed during this procedure. A total of Versed 1.0 mg and Fentanyl 50 mcg was administered intravenously. Moderate Sedation Time: 10 minutes. The patient's level of consciousness and vital signs were monitored continuously by radiology nursing throughout the procedure under my direct supervision. FLUOROSCOPY TIME:  CT COMPLICATIONS: None PROCEDURE: The procedure risks, benefits, and alternatives  were explained to the patient. Questions regarding the procedure were encouraged and answered. The patient understands and consents to the procedure. Scout CT of the pelvis was performed for surgical planning purposes. The posterior pelvis was prepped with Chlorhexidine in a sterile fashion, and a sterile drape was applied covering the operative field. A sterile gown and sterile gloves were used for the procedure. Local anesthesia was provided with 1% Lidocaine. Right posterior iliac bone was targeted for biopsy. The skin and subcutaneous tissues were infiltrated with 1% lidocaine without epinephrine. A small stab incision was made with an 11 blade scalpel, and an 11 gauge Murphy needle was advanced with CT guidance to the posterior cortex. Manual forced was used to advance the needle through the posterior cortex and the stylet was removed. A bone marrow aspirate was retrieved and passed to a cytotechnologist in the room. The Murphy needle was then advanced without the stylet for a core biopsy. The core biopsy was retrieved and also passed to a cytotechnologist. Manual pressure was used for hemostasis and a sterile dressing was placed. No complications were encountered no significant blood loss was encountered. Patient tolerated the procedure well and remained hemodynamically stable throughout. IMPRESSION: Status post CT-guided bone marrow biopsy, with tissue specimen sent to pathology for complete histopathologic analysis Signed, Dulcy Fanny. Earleen Newport, DO [facility] Radiology Electronically Signed   By: Corrie Mckusick D.O.   On: 06/24/2020 10:12    ASSESSMENT & PLAN:   Normocytic anemia # Anemia- etiology unclear- initial workup. Hemoglobin 10.7 which is overall down from past few years. WBC elevated. M spike present 0.2%. Kappa lamda light chain ratio elevated at 1.85. EPO elevated appropriately.  Iron saturation low normal, ferritin normal- June 2022- Bone marrow  Bx-negative for any obvious MDS/leukemia/significant plasma cell dyscrasia; The bone marrow is hypercellular for age with trilineage hematopoiesis and nonspecific myeloid changes.  No increase in blastic cells  identified.  Iron stores are markedly decreased/absent [see below]; 2% plasma cells noted however polytypic in nature [see below] flow cytometry of predominant T cells.  #Given the marked iron deficiency of the bone marrow-recommend iron supplementation.  Given the recent hemoglobin on 12; I think is reasonable to give a trial of oral iron.  If not improved at next visit would recommend IV iron infusion.  Patient daughter in agreement.  #Etiology: Unclear we will consider further evaluation next visit.   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  However June 2022 bone marrow biopsy 2% plasma cells noted/again polytypic.  I discussed the importance of M protein and the rare risk of transformation to multiple myeloma.  Patient is extremely low risk of transformation to myeloma.  # DISPOSITION: # follow in 3 months-MD; labs- cbc/bmp/iron studies/ferritin; possible venofer- Dr.B  All questions were answered. The patient knows to call the clinic with any problems, questions or concerns.   Cammie Sickle, MD 07/13/2020 10:42 PM

## 2020-07-11 ENCOUNTER — Encounter: Payer: Self-pay | Admitting: Urology

## 2020-07-11 ENCOUNTER — Ambulatory Visit (INDEPENDENT_AMBULATORY_CARE_PROVIDER_SITE_OTHER): Payer: Medicare Other | Admitting: Urology

## 2020-07-11 VITALS — BP 127/79 | HR 101 | Ht 68.0 in | Wt 224.0 lb

## 2020-07-11 DIAGNOSIS — N471 Phimosis: Secondary | ICD-10-CM | POA: Diagnosis not present

## 2020-07-12 ENCOUNTER — Encounter: Payer: Self-pay | Admitting: Urology

## 2020-07-12 DIAGNOSIS — N471 Phimosis: Secondary | ICD-10-CM | POA: Insufficient documentation

## 2020-07-12 NOTE — Progress Notes (Signed)
07/11/2020 12:43 PM   William Keith [birth year] [id number]  Referring provider: Idelle Crouch, MD [facility] [place],  [place] [address]  Chief Complaint  Patient presents with   Other    HPI: William Keith is an 80 y.o. male referred for evaluation of phimosis.  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 If you have any questions, ask your nurse or doctor.          amitriptyline 25 MG tablet Commonly known as: ELAVIL Take 25 mg by mouth at bedtime.   ARIPiprazole 5 MG tablet Commonly known as: ABILIFY Take 1 tablet by mouth daily.   atenolol 50 MG tablet Commonly known as: TENORMIN Take 50 mg by mouth daily.   atorvastatin 80 MG tablet Commonly known as: LIPITOR Take 80 mg by mouth daily.   buPROPion 150 MG 12 hr tablet Commonly known as: WELLBUTRIN SR Take 150 mg by mouth 2 (two) times daily.   D2000 Ultra Strength 50 MCG (2000 UT) Caps Generic drug: Cholecalciferol Take 2,000 Units by mouth daily.   desonide 0.05 % cream Commonly known as: DESOWEN Apply topically.   DULoxetine 60 MG capsule Commonly known as: CYMBALTA Take 2 capsules by mouth daily.   ezetimibe 10 MG tablet Commonly known as: ZETIA Take  1 tablet by mouth daily.   gabapentin 300 MG capsule Commonly known as: NEURONTIN Take 300 mg by mouth 3 (three) times daily.   modafinil 200 MG tablet Commonly known as: PROVIGIL Take by mouth.   multivitamin with minerals tablet Take 1 tablet by mouth at bedtime.   nitroGLYCERIN 0.4 MG/SPRAY spray Commonly known as: NITROLINGUAL Place 1 spray under the tongue every 5 (five) minutes x 3 doses as needed for chest pain.   omeprazole 40 MG capsule Commonly known as: PRILOSEC Take 40 mg by mouth daily.   oxyCODONE-acetaminophen 5-325 MG tablet Commonly known as: PERCOCET/ROXICET Take 1 tablet by mouth every 6 (six) hours as needed for moderate pain.   tamsulosin 0.4 MG Caps capsule Commonly known as: FLOMAX Take 0.4 mg by mouth at bedtime.   topiramate 50 MG tablet Commonly known as: TOPAMAX Take 50 mg by mouth at bedtime.   vitamin A 3 MG (10000 UNITS) capsule Take 10,000 Units by mouth daily.        Allergies:  Allergies  Allergen Reactions   Dilaudid [Hydromorphone Hcl] Other (See Comments)    hallucination    Family History: Family History  Problem Relation Age of Onset   Lung cancer Mother     Social History:  reports that he has been smoking pipe. He has  never used smokeless tobacco. He reports previous alcohol use. He reports that he does not use drugs.   Physical Exam: BP 127/79   Pulse (!) 101   Ht 5\' 8"  (1.727 m)   Wt 224 lb (101.6 kg)   BMI 34.06 kg/m   Constitutional:  Alert and oriented, No acute distress. HEENT: [place] AT, moist mucus membranes.  Trachea midline, no masses. Cardiovascular: No clubbing, cyanosis, or edema. Respiratory: Normal respiratory effort, no increased work of breathing. GU: Phallus circumcised, prepuce tight but retractable; glans normal in appearance.  Testes descended bilaterally without masses or tenderness Skin: No rashes, bruises or suspicious lesions. Neurologic: Grossly intact, no focal deficits, moving all 4  extremities. Psychiatric: Normal mood and affect.   Assessment & Plan:    1.  Phimosis Asymptomatic, no history of balanitis Options were discussed including observation, dorsal slit and circumcision He states he has no bothersome symptoms and desires observation He desires to follow-up prn   William Sons, MD  [facility] [address] [place], [place] [address] [phone]

## 2020-07-13 ENCOUNTER — Encounter: Payer: Self-pay | Admitting: Nurse Practitioner

## 2020-07-13 DIAGNOSIS — E611 Iron deficiency: Secondary | ICD-10-CM | POA: Insufficient documentation

## 2020-10-10 ENCOUNTER — Ambulatory Visit: Payer: Medicare Other | Admitting: Internal Medicine

## 2020-10-10 ENCOUNTER — Ambulatory Visit: Payer: Medicare Other

## 2020-10-10 ENCOUNTER — Other Ambulatory Visit: Payer: Medicare Other

## 2021-02-12 ENCOUNTER — Other Ambulatory Visit: Payer: Self-pay

## 2021-02-12 ENCOUNTER — Emergency Department
Admission: EM | Admit: 2021-02-12 | Discharge: 2021-02-12 | Disposition: A | Discharge status: Home / Self Care | Payer: Medicare Other | Attending: Emergency Medicine | Admitting: Emergency Medicine

## 2021-02-12 ENCOUNTER — Emergency Department: Payer: Medicare Other

## 2021-02-12 DIAGNOSIS — I110 Hypertensive heart disease with heart failure: Secondary | ICD-10-CM | POA: Diagnosis not present

## 2021-02-12 DIAGNOSIS — Y92480 Sidewalk as the place of occurrence of the external cause: Secondary | ICD-10-CM | POA: Insufficient documentation

## 2021-02-12 DIAGNOSIS — I509 Heart failure, unspecified: Secondary | ICD-10-CM | POA: Diagnosis not present

## 2021-02-12 DIAGNOSIS — W01198A Fall on same level from slipping, tripping and stumbling with subsequent striking against other object, initial encounter: Secondary | ICD-10-CM | POA: Insufficient documentation

## 2021-02-12 DIAGNOSIS — W19XXXA Unspecified fall, initial encounter: Secondary | ICD-10-CM

## 2021-02-12 DIAGNOSIS — Z7901 Long term (current) use of anticoagulants: Secondary | ICD-10-CM | POA: Insufficient documentation

## 2021-02-12 DIAGNOSIS — I4891 Unspecified atrial fibrillation: Secondary | ICD-10-CM | POA: Insufficient documentation

## 2021-02-12 DIAGNOSIS — Y9301 Activity, walking, marching and hiking: Secondary | ICD-10-CM | POA: Diagnosis not present

## 2021-02-12 DIAGNOSIS — S0181XA Laceration without foreign body of other part of head, initial encounter: Secondary | ICD-10-CM

## 2021-02-12 DIAGNOSIS — S0081XA Abrasion of other part of head, initial encounter: Secondary | ICD-10-CM

## 2021-02-12 DIAGNOSIS — G20 Parkinson's disease: Secondary | ICD-10-CM | POA: Diagnosis not present

## 2021-02-12 DIAGNOSIS — S0101XA Laceration without foreign body of scalp, initial encounter: Secondary | ICD-10-CM | POA: Insufficient documentation

## 2021-02-12 DIAGNOSIS — E119 Type 2 diabetes mellitus without complications: Secondary | ICD-10-CM | POA: Diagnosis not present

## 2021-02-12 DIAGNOSIS — S0990XA Unspecified injury of head, initial encounter: Secondary | ICD-10-CM | POA: Diagnosis present

## 2021-02-12 LAB — CBC WITH DIFFERENTIAL/PLATELET
Abs Immature Granulocytes: 0.05 10*3/uL (ref 0.00–0.07)
Basophils Absolute: 0.1 10*3/uL (ref 0.0–0.1)
Basophils Relative: 1 %
Eosinophils Absolute: 0.2 10*3/uL (ref 0.0–0.5)
Eosinophils Relative: 2 %
HCT: 46.6 % (ref 39.0–52.0)
Hemoglobin: 14.9 g/dL (ref 13.0–17.0)
Immature Granulocytes: 1 %
Lymphocytes Relative: 24 %
Lymphs Abs: 2.4 10*3/uL (ref 0.7–4.0)
MCH: 29.7 pg (ref 26.0–34.0)
MCHC: 32 g/dL (ref 30.0–36.0)
MCV: 92.8 fL (ref 80.0–100.0)
Monocytes Absolute: 1.1 10*3/uL — ABNORMAL HIGH (ref 0.1–1.0)
Monocytes Relative: 11 %
Neutro Abs: 6.2 10*3/uL (ref 1.7–7.7)
Neutrophils Relative %: 61 %
Platelets: 201 10*3/uL (ref 150–400)
RBC: 5.02 MIL/uL (ref 4.22–5.81)
RDW: 13.8 % (ref 11.5–15.5)
WBC: 10 10*3/uL (ref 4.0–10.5)
nRBC: 0 % (ref 0.0–0.2)

## 2021-02-12 LAB — BASIC METABOLIC PANEL
Anion gap: 6 (ref 5–15)
BUN: 15 mg/dL (ref 8–23)
CO2: 23 mmol/L (ref 22–32)
Calcium: 9 mg/dL (ref 8.9–10.3)
Chloride: 103 mmol/L (ref 98–111)
Creatinine, Ser: 0.69 mg/dL (ref 0.61–1.24)
GFR, Estimated: >60 mL/min (ref >60)
Glucose, Bld: 123 mg/dL — ABNORMAL HIGH (ref 70–99)
Potassium: 4.3 mmol/L (ref 3.5–5.1)
Sodium: 132 mmol/L — ABNORMAL LOW (ref 135–145)

## 2021-02-12 LAB — CBG MONITORING, ED: Glucose-Capillary: 120 mg/dL — ABNORMAL HIGH (ref 70–99)

## 2021-02-12 MED ORDER — LIDOCAINE-EPINEPHRINE 2 %-1:100000 IJ SOLN
20.0000 mL | Freq: Once | INTRAMUSCULAR | Status: AC
  Administered 2021-02-12: 20 mL via INTRADERMAL
  Filled 2021-02-12: qty 1

## 2021-02-12 MED ORDER — ACETAMINOPHEN 500 MG PO TABS
1000.0000 mg | ORAL_TABLET | Freq: Once | ORAL | Status: AC
  Administered 2021-02-12: 1000 mg via ORAL
  Filled 2021-02-12: qty 2

## 2021-02-12 NOTE — ED Provider Notes (Signed)
William Keith Provider Note    Event Date/Time   First MD Initiated Contact with Patient 02/12/21 1130     (approximate)   History   Dizziness and Fall (C/o trip and fall this AM at [employer] with lac to nose and left eyebrow. Pt. States he tripped over his owen feet, denies dizziness prior to fall. Pt. States since fall he has been dizzy. Pt. Has hx. Of afib, and is on blood thinners.)   HPI  William Keith is a 81 y.o. male with past medical history of Parkinson's disease, depression, A-fib on metoprolol and Eliquis, CHF, DVT, DM, HTN, HDL, GERD, OSA on CPAP, CVA, known right bundle branch block and chronic pain syndrome and some baseline stability from his Parkinson disease who presents for assessment after a fall that occurred shortly prior to arrival.  Patient states he was walking and thinks he tripped on a curb falling hitting his face and forehead.  He denies any other acute pain other than his face and head.  He has no new neck pain, middle back pain or change in his chronic lower back pain.  He has no extremity pain abdominal pain or chest pain.  No other recent falls or injuries.  He states he is otherwise been in his usual state health denies any recent fevers, chills, cough, nausea, vomiting, diarrhea, rash or any other sick symptoms.  He states his last tetanus was in the last 5 years      Physical Exam  Triage Vital Signs: ED Triage Vitals  Enc Vitals Group     BP 02/12/21 1129 (!) 141/58     Pulse Rate 02/12/21 1129 70     Resp 02/12/21 1129 18     Temp --      Temp src --      SpO2 02/12/21 1129 97 %     Weight 02/12/21 1131 225 lb 12 oz (102.4 kg)     Height 02/12/21 1131 5\' 8"  (1.727 m)     Head Circumference --      Peak Flow --      Pain Score 02/12/21 1130 5     Pain Loc --      Pain Edu? --      Excl. in [place]? --     Most recent vital signs: Vitals:   02/12/21 1200 02/12/21 1249  BP:  126/64  Pulse: 72   Resp: (!) 23 18   Temp:    SpO2: 99%     General: Awake, no distress.  CV:  Good peripheral perfusion.  Resp:  Normal effort.  Abd:  No distention.  Other:  Is approximately 1.5 cm linear laceration of the left eyebrow.  There is also abrasion over the nose and some dried blood in the right nare.  There is an abrasion over the inside of upper lip but no other obvious trauma to the face scalp or neck.  There is no tenderness over the C/T point/L-spine.  Cranials 2-12 are grossly intact.  Patient has symmetric and full strength in all the extremities.  2+ radial pulses.  Sensation is intact light touch all extremities.   ED Results / Procedures / Treatments  Labs (all labs ordered are listed, but only abnormal results are displayed) Labs Reviewed  CBC WITH DIFFERENTIAL/PLATELET - Abnormal; Notable for the following components:      Result Value   Monocytes Absolute 1.1 (*)    All other components within normal limits  BASIC METABOLIC PANEL - Abnormal; Notable for the following components:   Sodium 132 (*)    Glucose, Bld 123 (*)    All other components within normal limits  CBG MONITORING, ED - Abnormal; Notable for the following components:   Glucose-Capillary 120 (*)    All other components within normal limits     EKG  EKG is remarkable sinus rhythm with a right bundle branch block and a ventricular rate of 77, unremarkable intervals with nonspecific ST changes in anterior leads similar to prior EKG from 05/13/2020 without any other clear evidence of acute ischemia today.    RADIOLOGY   CT face, head and C-spine interpreted by myself show no evidence of skull fracture, intracranial hemorrhage, ischemia, mass effect or acute C-spine injury.  Also reviewed radiology interpretation and agree with the findings no acute injury other is evidence of prior cervical spine fusion.  Radiology does make note of small vessel disease and previous infarct as well as a left frontal scalp  contusion.   PROCEDURES:  Critical Care performed: No  .1-3 Lead EKG Interpretation Performed by: Lucrezia Starch, MD Authorized by: Lucrezia Starch, MD     Interpretation: normal     ECG rate assessment: normal     Rhythm: sinus rhythm     Ectopy: none     Conduction: abnormal   ..Laceration Repair  Date/Time: 02/12/2021 2:05 PM Performed by: Lucrezia Starch, MD Authorized by: Lucrezia Starch, MD   Consent:    Consent obtained:  Verbal   Consent given by:  Patient   Risks discussed:  Infection, pain, need for additional repair and poor cosmetic result   Alternatives discussed:  No treatment Universal protocol:    Procedure explained and questions answered to patient or proxy's satisfaction: yes     Patient identity confirmed:  Verbally with patient Laceration details:    Location:  Scalp   Length (cm):  1.5 Exploration:    Imaging outcome: foreign body not noted   Treatment:    Amount of cleaning:  Standard   Irrigation solution:  Sterile water   Irrigation method:  Syringe   Visualized foreign bodies/material removed: no     Debridement:  None   Undermining:  None   Scar revision: no   Skin repair:    Repair method:  Sutures   Suture size:  5-0   Suture material:  Prolene   Suture technique:  Simple interrupted   Number of sutures:  4 Approximation:    Approximation:  Close    MEDICATIONS ORDERED IN ED: Medications  lidocaine-EPINEPHrine (XYLOCAINE W/EPI) 2 %-1:100000 (with pres) injection 20 mL (20 mLs Intradermal Given 02/12/21 1250)  acetaminophen (TYLENOL) tablet 1,000 mg (1,000 mg Oral Given 02/12/21 1250)     IMPRESSION / MDM / [place] / ED COURSE  I reviewed the triage vital signs and the nursing notes.                              Differential diagnosis includes, but is not limited to facial laceration abrasions and contusion of the face versus possible underlying skull fracture intracranial hemorrhage or acute occult C-spine  injury.  Patient describes losing and balance which is not typical for him and does not remember feeling lightheaded dizzy or any associated chest pain nausea shortness of breath or any other symptoms before falling and do not believe this was syncopal.  EKG is remarkable  sinus rhythm with a right bundle branch block and a ventricular rate of 77, unremarkable intervals with nonspecific ST changes in anterior leads similar to prior EKG from 05/13/2020 without any other clear evidence of acute ischemia today.   CT face, head and C-spine interpreted by myself show no evidence of skull fracture, intracranial hemorrhage, ischemia, mass effect or acute C-spine injury.  Also reviewed radiology interpretation and agree with the findings no acute injury other is evidence of prior cervical spine fusion.  Radiology does make note of small vessel disease and previous infarct as well as a left frontal scalp contusion.  He reports his tetanus is up-to-date.  Laceration of the left eyebrow repaired per procedure note above.  Patient subsequently able to ambulate using his walker unassisted.  Low suspicion for other immediate life-threatening process.  Discharged in stable condition.  Strict return precautions advised and discussed.      FINAL CLINICAL IMPRESSION(S) / ED DIAGNOSES   Final diagnoses:  Fall, initial encounter  Facial laceration, initial encounter  Abrasion of face, initial encounter     Rx / DC Orders   ED Discharge Orders     None        Note:  This document was prepared using Dragon voice recognition software and may include unintentional dictation errors.   Lucrezia Starch, MD 02/12/21 [phone]

## 2021-02-12 NOTE — ED Notes (Signed)
Pt. A+O x4, conversing with staff, speaking with family on phone.

## 2021-02-12 NOTE — ED Triage Notes (Signed)
C/o trip and fall this AM at [employer] with lac to nose and left eyebrow. Pt. States he tripped over his owen feet, denies dizziness prior to fall. Pt. States since fall he has been dizzy. Pt. Has hx. Of afib, and is on blood thinners.

## 2021-02-12 NOTE — ED Notes (Signed)
Pt. Arrived back to RM from CT, medicated. Pt's family at bedside. Pt. Conversing easily, A+O x4.

## 2021-02-12 NOTE — ED Notes (Signed)
First encounter with pt to D/C. Pt ambulated around room with a pulse ox, pt maintained 02 sat on RA at 95-97%, MD aware.  D/C and reason to return to ED discussed with pt, pt also educated on wound care of laceration and when to have sutures removed, pt and wife verbalized understanding. Pt escorted via W/C per wife. NAD noted.

## 2021-02-12 NOTE — ED Notes (Signed)
ED Provider at bedside. 

## 2021-03-12 ENCOUNTER — Emergency Department
Admission: EM | Admit: 2021-03-12 | Discharge: 2021-03-12 | Disposition: A | Discharge status: Home / Self Care | Payer: Medicare Other | Attending: Emergency Medicine | Admitting: Emergency Medicine

## 2021-03-12 ENCOUNTER — Other Ambulatory Visit: Payer: Self-pay

## 2021-03-12 ENCOUNTER — Emergency Department: Payer: Medicare Other

## 2021-03-12 ENCOUNTER — Encounter: Payer: Self-pay | Admitting: Emergency Medicine

## 2021-03-12 DIAGNOSIS — I10 Essential (primary) hypertension: Secondary | ICD-10-CM | POA: Insufficient documentation

## 2021-03-12 DIAGNOSIS — R531 Weakness: Secondary | ICD-10-CM | POA: Diagnosis not present

## 2021-03-12 DIAGNOSIS — E1165 Type 2 diabetes mellitus with hyperglycemia: Secondary | ICD-10-CM | POA: Insufficient documentation

## 2021-03-12 DIAGNOSIS — H532 Diplopia: Secondary | ICD-10-CM | POA: Diagnosis present

## 2021-03-12 DIAGNOSIS — Z20822 Contact with and (suspected) exposure to covid-19: Secondary | ICD-10-CM | POA: Diagnosis not present

## 2021-03-12 DIAGNOSIS — Z7901 Long term (current) use of anticoagulants: Secondary | ICD-10-CM | POA: Diagnosis not present

## 2021-03-12 LAB — PROTIME-INR
INR: 1.1 (ref 0.8–1.2)
Prothrombin Time: 14 seconds (ref 11.4–15.2)

## 2021-03-12 LAB — APTT: aPTT: 32 seconds (ref 24–36)

## 2021-03-12 LAB — DIFFERENTIAL
Abs Immature Granulocytes: 0.04 10*3/uL (ref 0.00–0.07)
Basophils Absolute: 0 10*3/uL (ref 0.0–0.1)
Basophils Relative: 0 %
Eosinophils Absolute: 0.1 10*3/uL (ref 0.0–0.5)
Eosinophils Relative: 1 %
Immature Granulocytes: 0 %
Lymphocytes Relative: 18 %
Lymphs Abs: 1.9 10*3/uL (ref 0.7–4.0)
Monocytes Absolute: 0.8 10*3/uL (ref 0.1–1.0)
Monocytes Relative: 8 %
Neutro Abs: 7.5 10*3/uL (ref 1.7–7.7)
Neutrophils Relative %: 73 %

## 2021-03-12 LAB — CBC
HCT: 51.6 % (ref 39.0–52.0)
Hemoglobin: 15.8 g/dL (ref 13.0–17.0)
MCH: 28.8 pg (ref 26.0–34.0)
MCHC: 30.6 g/dL (ref 30.0–36.0)
MCV: 94 fL (ref 80.0–100.0)
Platelets: 225 10*3/uL (ref 150–400)
RBC: 5.49 MIL/uL (ref 4.22–5.81)
RDW: 13.8 % (ref 11.5–15.5)
WBC: 10.4 10*3/uL (ref 4.0–10.5)
nRBC: 0 % (ref 0.0–0.2)

## 2021-03-12 LAB — COMPREHENSIVE METABOLIC PANEL
ALT: 6 U/L (ref 0–44)
AST: 17 U/L (ref 15–41)
Albumin: 4.2 g/dL (ref 3.5–5.0)
Alkaline Phosphatase: 91 U/L (ref 38–126)
Anion gap: 8 (ref 5–15)
BUN: 11 mg/dL (ref 8–23)
CO2: 28 mmol/L (ref 22–32)
Calcium: 9.7 mg/dL (ref 8.9–10.3)
Chloride: 101 mmol/L (ref 98–111)
Creatinine, Ser: 0.94 mg/dL (ref 0.61–1.24)
GFR, Estimated: >60 mL/min (ref >60)
Glucose, Bld: 152 mg/dL — ABNORMAL HIGH (ref 70–99)
Potassium: 4.5 mmol/L (ref 3.5–5.1)
Sodium: 137 mmol/L (ref 135–145)
Total Bilirubin: 0.5 mg/dL (ref 0.3–1.2)
Total Protein: 7 g/dL (ref 6.5–8.1)

## 2021-03-12 LAB — RESP PANEL BY RT-PCR (FLU A&B, COVID) ARPGX2
Influenza A by PCR: NEGATIVE
Influenza B by PCR: NEGATIVE
SARS Coronavirus 2 by RT PCR: NEGATIVE

## 2021-03-12 LAB — CBG MONITORING, ED: Glucose-Capillary: 135 mg/dL — ABNORMAL HIGH (ref 70–99)

## 2021-03-12 MED ORDER — IOHEXOL 350 MG/ML SOLN
75.0000 mL | Freq: Once | INTRAVENOUS | Status: AC | PRN
  Administered 2021-03-12: 11:00:00 75 mL via INTRAVENOUS

## 2021-03-12 NOTE — ED Triage Notes (Signed)
Pt comes into the ED via POV c/o waking up this morning with double vision and generalized weakness.  Pt state he does take eliquis.  Pt denies any one sided weakness, slurred speech, or anything else.  Pt currently in NAD at this time with even and unlabored respirations.  ?

## 2021-03-12 NOTE — ED Notes (Signed)
Patient transported to CT 

## 2021-03-12 NOTE — ED Notes (Signed)
Patient transported to MRI 

## 2021-03-12 NOTE — ED Provider Notes (Signed)
? ?[facility] ?Provider Note ? ? ? Event Date/Time  ? First MD Initiated Contact with Patient 03/12/21 1004   ?  (approximate) ? ?History  ? ?Chief Complaint: Diplopia and Weakness ? ?HPI ? ?William Keith is a 81 y.o. male with a past medical history of diabetes, hypertension, hyperlipidemia, prior CVA who presents to the emergency department for double vision.  According to the patient he had a bad concussion 3 years ago that caused him blurred/double vision.  States he had to go to therapy for 2 years following the head injury.  States his vision had returned to near normal however this morning when he awoke he noted double vision that has not resolved.  Last known normal was last night before going to bed.  Patient denies any weakness or numbness of any arm or leg but does state a sense of generalized fatigue.  Largely negative review of systems otherwise.  Patient states he has a history of a prior stroke but denies any stroke symptoms previously, states he was told after getting brain imaging that he had had a small stroke in the past in the back of the brain. ? ?Physical Exam  ? ?Triage Vital Signs: ?ED Triage Vitals  ?Enc Vitals Group  ?   BP 03/12/21 1007 133/64  ?   Pulse Rate 03/12/21 1007 70  ?   Resp 03/12/21 1007 20  ?   Temp 03/12/21 1007 97.8 ?F (36.6 ?C)  ?   Temp Source 03/12/21 1007 Oral  ?   SpO2 03/12/21 1007 100 %  ?   Weight 03/12/21 0954 225 lb 12 oz (102.4 kg)  ?   Height 03/12/21 0954 '5\' 8"'$  (1.727 m)  ?   Head Circumference --   ?   Peak Flow --   ?   Pain Score 03/12/21 0954 0  ?   Pain Loc --   ?   Pain Edu? --   ?   Excl. in [place]? --   ? ? ?Most recent vital signs: ?Vitals:  ? 03/12/21 1007 03/12/21 1037  ?BP: 133/64 117/63  ?Pulse: 70 68  ?Resp: 20 12  ?Temp: 97.8 ?F (36.6 ?C)   ?SpO2: 100% 97%  ? ? ?General: Awake, no distress.  ?CV:  Good peripheral perfusion.  Regular rate and rhythm  ?Resp:  Normal effort.  Equal breath sounds bilaterally.  ?Abd:  No  distention.  Soft, nontender.  No rebound or guarding. ?Other:  Equal grip strength bilaterally with 5/5 strength in bilateral upper and lower extremities.  No cranial nerve deficits identified including no extraocular muscle deficits. ? ? ?ED Results / Procedures / Treatments  ? ?EKG ? ?EKG viewed and interpreted by myself shows normal sinus rhythm at 71 bpm with a slightly widened QRS, normal axis, normal intervals, nonspecific ST changes ? ?RADIOLOGY ? ?Personally reviewed the CT images, no acute abnormality seen on my evaluation.  No large bleed. ?Radiology is read the CT as negative for acute infarct or hemorrhage questionable hyperdense basilar artery. ? ? ?MEDICATIONS ORDERED IN ED: ?Medications - No data to display ? ? ?IMPRESSION / MDM / ASSESSMENT AND PLAN / ED COURSE  ?I reviewed the triage vital signs and the nursing notes. ? ?Patient presents to the emergency department for double vision that occurred since awakening this morning at 7:30 AM.  Last known normal was last night before going to bed.  Patient denies any extremity weakness, reassuring neurological and physical exam.  Does not  appear to have any extraocular nerve palsies on my exam but does state double vision throughout the exam.  We will check labs and CT imaging to evaluate for possible CVA or ICH. ? ?Radiologist called me about the CT scan head appears negative for acute infarct or hemorrhage but there is hyperdensity in the basilar artery concerning for possible occlusion.  We will obtain CTAs of the head and neck to further evaluate. ? ?Patient CTA are negative for acute abnormality we will proceed with MR imaging. ? ?Patient's MRI is normal as well.  I spoke to Dr. Cheral Marker of neurology.  He recommends next week to get MRIs of the orbits and a postcontrast MRI of the brain.  I spoke to the patient wife regarding this, patient states his right eye is now clear and his left eye is clearing they would like to hold off on additional imaging.   I spoke to Dr. Edison Pace of ophthalmology who agrees that further imaging would not be of significant benefit but believes the patient would benefit from a nonemergent eye examination which he will perform tomorrow morning at 8:30 AM.  I spoke to the wife and she is agreeable to this plan as well.  The remainder of the patient's work-up has been reassuring.  Lab work wise is normal.  Normal CBC, negative COVID/flu.  Normal chemistry.  Patient is awake alert no distress and now states improving symptoms.  I did consider admission to the hospital given his symptoms are concerning for possible CVA however given his reassuring work-up I believe the patient will be safe for discharge with ophthalmology follow-up. ? ?FINAL CLINICAL IMPRESSION(S) / ED DIAGNOSES  ? ?Diplopia ? ?Rx / DC Orders  ? ?Ophthalmology follow-up tomorrow morning 8:30 AM ? ?Note:  This document was prepared using Dragon voice recognition software and may include unintentional dictation errors. ?  ?Harvest Dark, MD ?03/12/21 1441 ? ?

## 2021-03-12 NOTE — Discharge Instructions (Addendum)
Please follow-up with ophthalmology tomorrow morning at 8:15 AM for an 8:30 AM appointment.  Return to the emergency department for any weakness or numbness of any arm or leg confusion, slurred speech, or any other symptom personally concerning to yourself. ? ?Please call the number provided for Dr. Ardelle Lesches, a neuro-ophthalmologist for further evaluation. ?

## 2021-12-15 ENCOUNTER — Emergency Department: Payer: Medicare Other

## 2021-12-15 ENCOUNTER — Emergency Department
Admission: EM | Admit: 2021-12-15 | Discharge: 2021-12-15 | Disposition: A | Discharge status: Home / Self Care | Payer: Medicare Other | Attending: Emergency Medicine | Admitting: Emergency Medicine

## 2021-12-15 ENCOUNTER — Other Ambulatory Visit: Payer: Self-pay

## 2021-12-15 DIAGNOSIS — R079 Chest pain, unspecified: Secondary | ICD-10-CM

## 2021-12-15 DIAGNOSIS — R0789 Other chest pain: Secondary | ICD-10-CM | POA: Diagnosis not present

## 2021-12-15 DIAGNOSIS — R0602 Shortness of breath: Secondary | ICD-10-CM | POA: Insufficient documentation

## 2021-12-15 DIAGNOSIS — Z7901 Long term (current) use of anticoagulants: Secondary | ICD-10-CM | POA: Insufficient documentation

## 2021-12-15 DIAGNOSIS — I10 Essential (primary) hypertension: Secondary | ICD-10-CM | POA: Insufficient documentation

## 2021-12-15 DIAGNOSIS — E119 Type 2 diabetes mellitus without complications: Secondary | ICD-10-CM | POA: Insufficient documentation

## 2021-12-15 LAB — CBC
HCT: 49.2 % (ref 39.0–52.0)
Hemoglobin: 15.5 g/dL (ref 13.0–17.0)
MCH: 28.8 pg (ref 26.0–34.0)
MCHC: 31.5 g/dL (ref 30.0–36.0)
MCV: 91.3 fL (ref 80.0–100.0)
Platelets: 219 10*3/uL (ref 150–400)
RBC: 5.39 MIL/uL (ref 4.22–5.81)
RDW: 14.3 % (ref 11.5–15.5)
WBC: 12.7 10*3/uL — ABNORMAL HIGH (ref 4.0–10.5)
nRBC: 0 % (ref 0.0–0.2)

## 2021-12-15 LAB — BASIC METABOLIC PANEL
Anion gap: 8 (ref 5–15)
BUN: 12 mg/dL (ref 8–23)
CO2: 23 mmol/L (ref 22–32)
Calcium: 9.4 mg/dL (ref 8.9–10.3)
Chloride: 104 mmol/L (ref 98–111)
Creatinine, Ser: 0.69 mg/dL (ref 0.61–1.24)
GFR, Estimated: >60 mL/min (ref >60)
Glucose, Bld: 124 mg/dL — ABNORMAL HIGH (ref 70–99)
Potassium: 4 mmol/L (ref 3.5–5.1)
Sodium: 135 mmol/L (ref 135–145)

## 2021-12-15 LAB — TROPONIN I (HIGH SENSITIVITY)
Troponin I (High Sensitivity): 5 ng/L (ref <18)
Troponin I (High Sensitivity): 5 ng/L (ref <18)

## 2021-12-15 MED ORDER — IOHEXOL 350 MG/ML SOLN
75.0000 mL | Freq: Once | INTRAVENOUS | Status: AC | PRN
  Administered 2021-12-15: 75 mL via INTRAVENOUS

## 2021-12-15 NOTE — ED Provider Notes (Signed)
[facility] Provider Note    Event Date/Time   First MD Initiated Contact with Patient 12/15/21 1026     (approximate)   History   Chief Complaint Chest Pain   HPI  William Keith is a 81 y.o. male with past medical history of hypertension, hyperlipidemia, diabetes, atrial fibrillation on Eliquis, stroke, MGUS, and chronic pain syndrome who presents to the ED complaining of chest pain.  Patient reports since yesterday afternoon he has had sharp pain in the center of his chest along with a tight feeling in his chest.  He states that the pain seems to get worse whenever he takes a deep breath and he has been feeling slightly short of breath.  He denies any associated fevers or cough, has not noticed any pain or swelling in his legs.  He reports being compliant with his Eliquis and has not missed any doses.  He denies any abdominal pain, nausea, vomiting, or dysuria.     Physical Exam   Triage Vital Signs: ED Triage Vitals  Enc Vitals Group     BP 12/15/21 0850 (!) 149/83     Pulse Rate 12/15/21 0850 98     Resp 12/15/21 0850 18     Temp 12/15/21 0850 98.3 F (36.8 C)     Temp Source 12/15/21 0850 Oral     SpO2 12/15/21 0850 99 %     Weight --      Height --      Head Circumference --      Peak Flow --      Pain Score 12/15/21 0848 7     Pain Loc --      Pain Edu? --      Excl. in [place]? --     Most recent vital signs: Vitals:   12/15/21 1200 12/15/21 1433  BP: 125/82 120/80  Pulse: 86 80  Resp: (!) 22 20  Temp:  98 F (36.7 C)  SpO2: 96% 96%    Constitutional: Alert and oriented. Eyes: Conjunctivae are normal. Head: Atraumatic. Nose: No congestion/rhinnorhea. Mouth/Throat: Mucous membranes are moist.  Cardiovascular: Normal rate, regular rhythm. Grossly normal heart sounds.  2+ radial pulses bilaterally. Respiratory: Normal respiratory effort.  No retractions. Lungs CTAB.  No chest wall tenderness to palpation. Gastrointestinal: Soft  and nontender. No distention. Musculoskeletal: No lower extremity tenderness nor edema.  Neurologic:  Normal speech and language. No gross focal neurologic deficits are appreciated.    ED Results / Procedures / Treatments   Labs (all labs ordered are listed, but only abnormal results are displayed) Labs Reviewed  BASIC METABOLIC PANEL - Abnormal; Notable for the following components:      Result Value   Glucose, Bld 124 (*)    All other components within normal limits  CBC - Abnormal; Notable for the following components:   WBC 12.7 (*)    All other components within normal limits  TROPONIN I (HIGH SENSITIVITY)  TROPONIN I (HIGH SENSITIVITY)     EKG  ED ECG REPORT I, Blake Divine, the attending physician, personally viewed and interpreted this ECG.   Date: 12/15/2021  EKG Time: 8:48  Rate: 98  Rhythm: normal sinus rhythm  Axis: Normal  Intervals:right bundle branch block  ST&T Change: None  RADIOLOGY Chest x-ray reviewed and interpreted by me with no infiltrate, edema, or effusion.  PROCEDURES:  Critical Care performed: No  Procedures   MEDICATIONS ORDERED IN ED: Medications  iohexol (OMNIPAQUE) 350 MG/ML injection 75 mL (  75 mLs Intravenous Contrast Given 12/15/21 1212)     IMPRESSION / MDM / ASSESSMENT AND PLAN / ED COURSE  I reviewed the triage vital signs and the nursing notes.                              81 y.o. male with past medical history of hypertension, hyperlipidemia, diabetes, atrial fibrillation on Eliquis, stroke, MGUS, and chronic pain syndrome who presents to the ED with constant chest pain since yesterday associated with some difficulty breathing.  Patient's presentation is most consistent with acute presentation with potential threat to life or bodily function.  Differential diagnosis includes, but is not limited to, ACS, PE, dissection, pneumonia, pneumothorax, musculoskeletal pain, GERD, arrhythmia, anemia.  Patient  nontoxic-appearing and in no acute distress, vital signs are unremarkable.  EKG shows known right bundle branch block with no ischemic changes and initial troponin is negative, low suspicion for ACS given his atypical symptoms but will trend troponin.  Chest x-ray is unremarkable and additional labs are reassuring with no significant anemia, leukocytosis, electrolyte abnormality, or AKI.  Given his pleuritic pain, will further assess with CTA of his chest to rule out PE.  CTA is negative for PE or other acute process, repeat troponin within normal limits.  Given reassuring workup, patient is appropriate for discharge home and will follow-up with his cardiologist at [facility].  He was counseled to return to the ED for new or worsening symptoms, patient agrees with plan.      FINAL CLINICAL IMPRESSION(S) / ED DIAGNOSES   Final diagnoses:  Nonspecific chest pain     Rx / DC Orders   ED Discharge Orders     None        Note:  This document was prepared using Dragon voice recognition software and may include unintentional dictation errors.   Blake Divine, MD 12/15/21 [phone]

## 2021-12-15 NOTE — ED Triage Notes (Signed)
Pt to ED via POV from home. Pt reports centralized CP that started last night and has gotten worse. Pt also reports SOB with exertion. Pt denies N/V/D. Pt on Eliquis for afib.

## 2022-08-01 ENCOUNTER — Encounter: Payer: Self-pay | Admitting: Internal Medicine

## 2022-08-01 ENCOUNTER — Emergency Department: Payer: Medicare Other

## 2022-08-01 ENCOUNTER — Observation Stay
Admission: EM | Admit: 2022-08-01 | Discharge: 2022-08-02 | Disposition: A | Discharge status: Home / Self Care | Payer: Medicare Other | Attending: Internal Medicine | Admitting: Internal Medicine

## 2022-08-01 ENCOUNTER — Other Ambulatory Visit: Payer: Self-pay

## 2022-08-01 DIAGNOSIS — F17290 Nicotine dependence, other tobacco product, uncomplicated: Secondary | ICD-10-CM | POA: Diagnosis not present

## 2022-08-01 DIAGNOSIS — I452 Bifascicular block: Secondary | ICD-10-CM | POA: Diagnosis present

## 2022-08-01 DIAGNOSIS — I480 Paroxysmal atrial fibrillation: Secondary | ICD-10-CM | POA: Diagnosis not present

## 2022-08-01 DIAGNOSIS — E1140 Type 2 diabetes mellitus with diabetic neuropathy, unspecified: Secondary | ICD-10-CM | POA: Insufficient documentation

## 2022-08-01 DIAGNOSIS — G3184 Mild cognitive impairment, so stated: Secondary | ICD-10-CM | POA: Diagnosis not present

## 2022-08-01 DIAGNOSIS — I10 Essential (primary) hypertension: Secondary | ICD-10-CM | POA: Diagnosis not present

## 2022-08-01 DIAGNOSIS — E7800 Pure hypercholesterolemia, unspecified: Secondary | ICD-10-CM | POA: Diagnosis not present

## 2022-08-01 DIAGNOSIS — G20C Parkinsonism, unspecified: Secondary | ICD-10-CM | POA: Insufficient documentation

## 2022-08-01 DIAGNOSIS — F331 Major depressive disorder, recurrent, moderate: Secondary | ICD-10-CM | POA: Diagnosis present

## 2022-08-01 DIAGNOSIS — F32A Depression, unspecified: Secondary | ICD-10-CM | POA: Diagnosis present

## 2022-08-01 DIAGNOSIS — Z8673 Personal history of transient ischemic attack (TIA), and cerebral infarction without residual deficits: Secondary | ICD-10-CM | POA: Diagnosis not present

## 2022-08-01 DIAGNOSIS — R4189 Other symptoms and signs involving cognitive functions and awareness: Secondary | ICD-10-CM | POA: Insufficient documentation

## 2022-08-01 DIAGNOSIS — E669 Obesity, unspecified: Secondary | ICD-10-CM | POA: Diagnosis present

## 2022-08-01 DIAGNOSIS — G894 Chronic pain syndrome: Secondary | ICD-10-CM | POA: Diagnosis not present

## 2022-08-01 DIAGNOSIS — R270 Ataxia, unspecified: Secondary | ICD-10-CM

## 2022-08-01 DIAGNOSIS — E119 Type 2 diabetes mellitus without complications: Secondary | ICD-10-CM

## 2022-08-01 DIAGNOSIS — Z9181 History of falling: Secondary | ICD-10-CM | POA: Diagnosis not present

## 2022-08-01 DIAGNOSIS — K219 Gastro-esophageal reflux disease without esophagitis: Secondary | ICD-10-CM | POA: Diagnosis present

## 2022-08-01 DIAGNOSIS — Z6830 Body mass index (BMI) 30.0-30.9, adult: Secondary | ICD-10-CM | POA: Diagnosis not present

## 2022-08-01 DIAGNOSIS — I951 Orthostatic hypotension: Secondary | ICD-10-CM | POA: Diagnosis present

## 2022-08-01 DIAGNOSIS — G629 Polyneuropathy, unspecified: Secondary | ICD-10-CM

## 2022-08-01 DIAGNOSIS — R296 Repeated falls: Secondary | ICD-10-CM | POA: Diagnosis not present

## 2022-08-01 DIAGNOSIS — I679 Cerebrovascular disease, unspecified: Secondary | ICD-10-CM | POA: Diagnosis present

## 2022-08-01 DIAGNOSIS — M5136 Other intervertebral disc degeneration, lumbar region: Secondary | ICD-10-CM | POA: Insufficient documentation

## 2022-08-01 DIAGNOSIS — U071 COVID-19: Principal | ICD-10-CM | POA: Insufficient documentation

## 2022-08-01 DIAGNOSIS — Z79899 Other long term (current) drug therapy: Secondary | ICD-10-CM | POA: Diagnosis not present

## 2022-08-01 DIAGNOSIS — M51369 Other intervertebral disc degeneration, lumbar region without mention of lumbar back pain or lower extremity pain: Secondary | ICD-10-CM | POA: Diagnosis present

## 2022-08-01 DIAGNOSIS — D472 Monoclonal gammopathy: Secondary | ICD-10-CM | POA: Diagnosis present

## 2022-08-01 DIAGNOSIS — R5383 Other fatigue: Secondary | ICD-10-CM | POA: Diagnosis present

## 2022-08-01 DIAGNOSIS — R531 Weakness: Principal | ICD-10-CM

## 2022-08-01 LAB — CBC
HCT: 49.2 % (ref 39.0–52.0)
Hemoglobin: 15.8 g/dL (ref 13.0–17.0)
MCH: 28.8 pg (ref 26.0–34.0)
MCHC: 32.1 g/dL (ref 30.0–36.0)
MCV: 89.6 fL (ref 80.0–100.0)
Platelets: 203 10*3/uL (ref 150–400)
RBC: 5.49 MIL/uL (ref 4.22–5.81)
RDW: 14.1 % (ref 11.5–15.5)
WBC: 10 10*3/uL (ref 4.0–10.5)
nRBC: 0 % (ref 0.0–0.2)

## 2022-08-01 LAB — COMPREHENSIVE METABOLIC PANEL
ALT: 20 U/L (ref 0–44)
AST: 13 U/L — ABNORMAL LOW (ref 15–41)
Albumin: 4 g/dL (ref 3.5–5.0)
Alkaline Phosphatase: 80 U/L (ref 38–126)
Anion gap: 10 (ref 5–15)
BUN: 13 mg/dL (ref 8–23)
CO2: 22 mmol/L (ref 22–32)
Calcium: 9.5 mg/dL (ref 8.9–10.3)
Chloride: 103 mmol/L (ref 98–111)
Creatinine, Ser: 0.97 mg/dL (ref 0.61–1.24)
GFR, Estimated: >60 mL/min (ref >60)
Glucose, Bld: 97 mg/dL (ref 70–99)
Potassium: 4.1 mmol/L (ref 3.5–5.1)
Sodium: 135 mmol/L (ref 135–145)
Total Bilirubin: 0.7 mg/dL (ref 0.3–1.2)
Total Protein: 6.7 g/dL (ref 6.5–8.1)

## 2022-08-01 LAB — URINALYSIS, COMPLETE (UACMP) WITH MICROSCOPIC
Bacteria, UA: NONE SEEN
Bilirubin Urine: NEGATIVE
Glucose, UA: NEGATIVE mg/dL
Hgb urine dipstick: NEGATIVE
Ketones, ur: NEGATIVE mg/dL
Leukocytes,Ua: NEGATIVE
Nitrite: NEGATIVE
Protein, ur: NEGATIVE mg/dL
Specific Gravity, Urine: 1.013 (ref 1.005–1.030)
Squamous Epithelial / HPF: NONE SEEN /HPF (ref 0–5)
pH: 6 (ref 5.0–8.0)

## 2022-08-01 LAB — RESP PANEL BY RT-PCR (RSV, FLU A&B, COVID)  RVPGX2
Influenza A by PCR: NEGATIVE
Influenza B by PCR: NEGATIVE
Resp Syncytial Virus by PCR: NEGATIVE
SARS Coronavirus 2 by RT PCR: POSITIVE — AB

## 2022-08-01 LAB — TROPONIN I (HIGH SENSITIVITY): Troponin I (High Sensitivity): 5 ng/L (ref <18)

## 2022-08-01 MED ORDER — SODIUM CHLORIDE 0.9 % IV BOLUS
1000.0000 mL | Freq: Once | INTRAVENOUS | Status: AC
  Administered 2022-08-01: 1000 mL via INTRAVENOUS

## 2022-08-01 MED ORDER — ONDANSETRON HCL 4 MG/2ML IJ SOLN: 4.0000 mg | Freq: Four times a day (QID) | INTRAMUSCULAR | Status: DC | PRN

## 2022-08-01 MED ORDER — HYDRALAZINE HCL 20 MG/ML IJ SOLN: 5.0000 mg | Freq: Three times a day (TID) | INTRAMUSCULAR | Status: DC | PRN

## 2022-08-01 MED ORDER — MELATONIN 5 MG PO TABS: 5.0000 mg | ORAL_TABLET | Freq: Every evening | ORAL | Status: DC | PRN

## 2022-08-01 MED ORDER — GUAIFENESIN 100 MG/5ML PO LIQD
5.0000 mL | ORAL | Status: DC | PRN
  Administered 2022-08-01: 5 mL via ORAL
  Filled 2022-08-01 (×2): qty 10

## 2022-08-01 MED ORDER — SENNOSIDES-DOCUSATE SODIUM 8.6-50 MG PO TABS: 1.0000 | ORAL_TABLET | Freq: Every evening | ORAL | Status: DC | PRN

## 2022-08-01 MED ORDER — METOPROLOL TARTRATE 5 MG/5ML IV SOLN
5.0000 mg | INTRAVENOUS | Status: DC | PRN
  Administered 2022-08-02: 5 mg via INTRAVENOUS
  Filled 2022-08-01: qty 5

## 2022-08-01 MED ORDER — ENOXAPARIN SODIUM 60 MG/0.6ML IJ SOSY
0.5000 mg/kg | PREFILLED_SYRINGE | INTRAMUSCULAR | Status: DC
  Administered 2022-08-01: 47.5 mg via SUBCUTANEOUS
  Filled 2022-08-01: qty 0.6

## 2022-08-01 MED ORDER — ACETAMINOPHEN 650 MG RE SUPP: 650.0000 mg | Freq: Four times a day (QID) | RECTAL | Status: DC | PRN

## 2022-08-01 MED ORDER — CARBIDOPA-LEVODOPA 25-100 MG PO TABS
2.5000 | ORAL_TABLET | Freq: Three times a day (TID) | ORAL | Status: DC
  Administered 2022-08-01 – 2022-08-02 (×3): 2.5 via ORAL
  Filled 2022-08-01 (×3): qty 3

## 2022-08-01 MED ORDER — TAMSULOSIN HCL 0.4 MG PO CAPS
0.4000 mg | ORAL_CAPSULE | Freq: Every day | ORAL | Status: DC
  Administered 2022-08-01: 0.4 mg via ORAL
  Filled 2022-08-01: qty 1

## 2022-08-01 MED ORDER — BENZONATATE 100 MG PO CAPS: 100.0000 mg | ORAL_CAPSULE | Freq: Two times a day (BID) | ORAL | Status: DC | PRN

## 2022-08-01 MED ORDER — ACETAMINOPHEN 325 MG PO TABS: 650.0000 mg | ORAL_TABLET | Freq: Four times a day (QID) | ORAL | Status: DC | PRN

## 2022-08-01 MED ORDER — ONDANSETRON HCL 4 MG PO TABS: 4.0000 mg | ORAL_TABLET | Freq: Four times a day (QID) | ORAL | Status: DC | PRN

## 2022-08-01 MED ORDER — SODIUM CHLORIDE 0.9 % IV SOLN: INTRAVENOUS | Status: AC

## 2022-08-01 NOTE — TOC CM/SW Note (Signed)
Transition of Care [facility]) - Inpatient Brief Assessment   Patient Details  Name: William Keith MRN: [id number] Date of Birth: 1940/05/08  Transition of Care [facility]) CM/SW Contact:    Kemper Durie, RN Phone Number: 08/01/2022, 3:32 PM   Clinical Narrative:    Transition of Care Asessment: Insurance and Status: Insurance coverage has been reviewed Patient has primary care physician: Yes Home environment has been reviewed: Home with wife Prior level of function:: Independent Prior/Current Home Services: No current home services Social Determinants of Health Reivew: SDOH reviewed no interventions necessary Readmission risk has been reviewed: Yes Transition of care needs: no transition of care needs at this time

## 2022-08-01 NOTE — Assessment & Plan Note (Signed)
Asymptomatic, continue outpatient monitoring per cardiologist as appropriate

## 2022-08-01 NOTE — ED Triage Notes (Signed)
Pt states coming in with bilateral leg weakness for the last several days, but that it has been getting progressively worse. Pt states has a cardiac appointment tomorrow, due to episodes of tachycardia. Wife states pt has been having orthostatic hypotension and is dehydrated.

## 2022-08-01 NOTE — Plan of Care (Signed)

## 2022-08-01 NOTE — ED Notes (Signed)
Labs states that pt's lab tube is hemolyzed once again. Lab to send tech to draw tube at this time.

## 2022-08-01 NOTE — Assessment & Plan Note (Signed)
Metoprolol tartrate 5 mg IV every 3 hours as needed for heart rate greater than 120, 4 doses ordered

## 2022-08-01 NOTE — ED Provider Notes (Signed)
[facility] Provider Note    Event Date/Time   First MD Initiated Contact with Patient 08/01/22 [phone]     (approximate)  History   Chief Complaint: Extremity Weakness (Bilateral)  HPI  William Keith is a 82 y.o. male with a past medical history of atrial fibrillation, diabetes, hypertension, hyperlipidemia, Parkinson's, presents to the emergency department for generalized weakness.  According to the patient he has Parkinson's but is normally able to ambulate with a walker with no other assistance.  He states for the past 2 days he has been feeling extremely weak in both of his legs and throughout his body he has not been able to get out of the chair in the last 2 days due to weakness.  Wife states this has happened 1 time before although not to this extent thought to be due to dehydration.  Patient denies any recent illnesses denies any fever cough congestion vomiting diarrhea or dysuria.  Physical Exam   Triage Vital Signs: ED Triage Vitals [08/01/22 0935]  Encounter Vitals Group     BP 138/75     Systolic BP Percentile      Diastolic BP Percentile      Pulse Rate (!) 110     Resp (!) 24     Temp 97.6 F (36.4 C)     Temp src      SpO2 95 %     Weight 214 lb (97.1 kg)     Height 5\' 8"  (1.727 m)     Head Circumference      Peak Flow      Pain Score 0     Pain Loc      Pain Education      Exclude from Growth Chart     Most recent vital signs: Vitals:   08/01/22 0935  BP: 138/75  Pulse: (!) 110  Resp: (!) 24  Temp: 97.6 F (36.4 C)  SpO2: 95%    General: Awake, no distress.  CV:  Good peripheral perfusion.  Regular rate and rhythm  Resp:  Normal effort.  Equal breath sounds bilaterally.  Abd:  No distention.  Soft, nontender.  No rebound or guarding. Other:  4+/5 strength in bilateral lower extremities, 5/5 strength in bilateral upper extremities.  No obvious cranial nerve deficit  ED Results / Procedures / Treatments   EKG  EKG  viewed and interpreted by myself shows sinus tachycardia 111 bpm with a narrow QRS, normal axis, normal intervals, nonspecific but no concerning ST changes.  RADIOLOGY  I have reviewed and interpreted chest x-ray images.  No consolidation on my evaluation. Radiology is read the x-ray is negative.   MEDICATIONS ORDERED IN ED: Medications  sodium chloride 0.9 % bolus 1,000 mL (has no administration in time range)     IMPRESSION / MDM / ASSESSMENT AND PLAN / ED COURSE  I reviewed the triage vital signs and the nursing notes.  Patient's presentation is most consistent with acute presentation with potential threat to life or bodily function.  Patient presents to the emergency department for generalized weakness.  Patient has a history of Parkinson's but normally ambulates with a walker.  For the past 2 days patient has not been able to get out of a chair or bed which is very atypical for the patient.  Denies any unilateral weakness.  Reassuring neurological exam.  We will check labs including blood work, urinalysis, obtain a COVID swab, chest x-ray we will IV hydrate and continue to  closely monitor while awaiting results.  Patient agreeable to plan.  Chest x-ray is clear.  Patient's labs have resulted showing a reassuring CBC, normal troponin.  Patient's COVID test is positive likely explaining the patient's symptoms.  Given the patient's Parkinson's with generalized weakness unable to ambulate or get out of a chair and COVID-positive status we will admit to the hospital service for further workup treatment and supportive care.  Patient and wife agreeable to plan of care.  Patient's chemistry is reassuring with normal renal function.  We will start the patient on Paxlovid and admit to the hospitalist service.  FINAL CLINICAL IMPRESSION(S) / ED DIAGNOSES   Weakness Covid   Note:  This document was prepared using Dragon voice recognition software and may include unintentional dictation  errors.   Minna Antis, MD 08/01/22 1243

## 2022-08-01 NOTE — Assessment & Plan Note (Addendum)
Continue airborne and contact precaution Patient does not have respiratory symptoms at this time, I extensively discussed with patient and his spouse that should he develop cough, shortness of breath, nausea, vomiting, or chest discomfort, patient to let nursing know so we can reevaluate for consideration of IV steroids. Patient repeated the above to me and endorsed understanding and compliance.

## 2022-08-01 NOTE — Assessment & Plan Note (Signed)
At baseline, patient was able to tell me his name, age, location, current calendar year.

## 2022-08-01 NOTE — Hospital Course (Addendum)
Mr. William Keith is a 82 year old male with depression, anxiety, neuropathy, hypertension, Parkinson's disease, GERD, BPH, hyperlipidemia, who presents to the emergency department for chief concerns of bilateral leg weakness.  Vitals in the ED showed temperature 97.6, respiration rate of 14, heart rate of 102, blood pressure 138/75, SpO2 of 98% on room air.  Serum Na is 135, K 4.1, chloride 103, bicarb 22, BUN of 13, serum creatinine of 0.97, nonfasting blood glucose 97, EGFR greater than 60, WBC 10, hemoglobin 15.8, platelets of 203. CXR was negative for any acute abnormality. High sensitive troponin was 5.  UA was negative for leukocytes and nitrates.    Patient tested positive for COVID by PCR.  Influenza A, influenza B, RSV by PCR were negative.  ED treatment: Sodium chloride 1 L bolus.  7/29: Patient remained stable.  Dizziness and weakness is most likely secondary to COVID infection.  Our physical therapist evaluated him and recommended home health.  No respiratory symptoms but stating that he might be started getting some congestion.  Chest x-ray clear.  Patient was advised to keep himself well-hydrated, plenty of rest.  He can use Robitussin or Tessalon Perles if needed for cough.  No need for steroid or any other antiviral at this time. Patient should follow-up with primary care provider in case he develops some symptoms for symptom management.  Patient is on multiple psych and pain medications which can also cause dizziness.  He will need a good med rec by PCP and back off on some of them as appropriate.  Per patient he was taking all of those medications but a lot of them with no recent confirmed or documented refills.  Home health services were ordered.  Patient will continue on current medications and need to have a close follow-up with his providers for further recommendations.

## 2022-08-01 NOTE — Assessment & Plan Note (Signed)
Fall precautions, PT, OT Sodium chloride infusion at 100 mL/h, 1 day ordered Incentive spirometry and flutter valve, into 8-10 reps every 2 hours while awake, 45 days recommended Admit to telemetry medical, observation

## 2022-08-01 NOTE — Assessment & Plan Note (Addendum)
Resumed home carbidopa-levodopa 25-100 mg tablet Per patient spouse, home dosing instructions are as follows: 8AM, 2.5 tablets. Noon, 2.5 tablets. 5 pm: 2.5 tablets.  Resume per home dosing

## 2022-08-01 NOTE — Assessment & Plan Note (Signed)
-   This complicates overall care and prognosis.  

## 2022-08-01 NOTE — ED Notes (Signed)
Patients stood at bedside with one person assist to use urinal. Pt helped back to bed.

## 2022-08-01 NOTE — H&P (Signed)
History and Physical   William Keith YQI:[id number] DOB: Mar 09, 1940 DOA: 08/01/2022  PCP: Marguarite Arbour, MD  Outpatient Specialists: Dr. Roxine Caddy, [facility] Patient coming from: home  I have personally briefly reviewed patient's old medical records in [facility] EMR.  Chief Concern: weakness of both legs  HPI: William Keith is a 82 year old male with depression, anxiety, neuropathy, hypertension, Parkinson's disease, GERD, BPH, hyperlipidemia, who presents to the emergency department for chief concerns of bilateral leg weakness.  Vitals in the ED showed temperature 97.6, respiration rate of 14, heart rate of 102, blood pressure 138/75, SpO2 of 98% on room air.  Serum Na is 135, K 4.1, chloride 103, bicarb 22, BUN of 13, serum creatinine of 0.97, nonfasting blood glucose 97, EGFR greater than 60, WBC 10, hemoglobin 15.8, platelets of 203.  High sensitive troponin was 5.  UA was negative for leukocytes and nitrates.    Patient tested positive for COVID by PCR.  Influenza A, influenza B, RSV by PCR were negative.  ED treatment: Sodium chloride 1 L bolus. ---------------------------- At bedside, patient was able tell me his name, age, current calendar year, current location.   He reports bilateral leg weakness that started about three days ago. He denies trauma to his person. He reprots he was sitting in his chair and could not get up from his reclining chair.   At baseline he is able to get of the chair 'easily' and he uses his walker for long walks only.   In the last three days, he has had to use his walker to walk around the apartment and to get up from chairs.  Spouse reports that actually, he has been requiring the walker for the last 2 weeks not just the last 3 weeks due to weakness.  Social history: He lives at home with his wife. He is a former tobacco user, quitting 4 weeks ago, it was 4-5 bowels tobacco per day. He used tobacco in a pipe. He denies  etoh and recreational drug use. He is retired and formerly was a Archivist.  ROS: Constitutional: no weight change, no fever ENT/Mouth: no sore throat, no rhinorrhea Eyes: no eye pain, no vision changes Cardiovascular: no chest pain, no dyspnea,  no edema, no palpitations Respiratory: no cough, no sputum, no wheezing Gastrointestinal: no nausea, no vomiting, no diarrhea, no constipation Genitourinary: no urinary incontinence, no dysuria, no hematuria Musculoskeletal: no arthralgias, no myalgias Skin: no skin lesions, no pruritus, Neuro: + weakness, no loss of consciousness, no syncope Psych: no anxiety, no depression, no decrease appetite Heme/Lymph: no bruising, no bleeding  ED Course: Discussed with emergency medicine provider, patient requiring hospitalization for chief concerns of weakness, at risk for fall.  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)   Sensorimotor neuropathy   PAF (paroxysmal atrial fibrillation) (HCC)   Cerebrovascular disease   Essential hypertension   Primary parkinsonism   Cognitive impairment   COVID-19 virus infection   Assessment and Plan:  *  At high risk for falls Fall precautions, PT, OT Sodium chloride infusion at 100 mL/h, 1 day ordered Incentive spirometry and flutter valve, into 8-10 reps every 2 hours while awake, 45 days recommended Admit to telemetry medical, observation  COVID-19 virus infection Continue airborne and  contact precaution Patient does not have respiratory symptoms at this time, I extensively discussed with patient and his spouse that should he develop cough, shortness of breath, nausea, vomiting, or chest discomfort, patient to let nursing know so we can reevaluate for consideration of IV steroids. Patient repeated the above to me and endorsed understanding and compliance.  Cognitive impairment At baseline, patient was able to tell me his name, age, location, current calendar year.  Primary parkinsonism Resumed home carbidopa-levodopa 25-100 mg tablet Per patient spouse, home dosing instructions are as follows: 8AM, 2.5 tablets. Noon, 2.5 tablets. 5 pm: 2.5 tablets.  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   Chronic pain syndrome PDMP reviewed Patient last had oxycodone 5 mg tablet, 7-day course ordered on 05/12/2022  Chronic right leg weakness compared to the left, this has been ongoing for many years  Note: AM team to complete med reconciliation.  I attempted to call spouse for second time, for med rec information however no pickup.  Chart reviewed.   DVT prophylaxis: Enoxaparin Code Status: DNR, 'I am 82 years old. 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 He has never used smokeless tobacco. He reports that he does not currently use alcohol. He reports that he does not use drugs.  Allergies  Allergen Reactions   Dilaudid [Hydromorphone Hcl] Other (See Comments)    hallucination   Family History  Problem Relation Age of Onset   Lung cancer Mother    Alcohol abuse Mother    Sleep apnea Father    Macular degeneration Father    Family history: Family history reviewed and not pertinent.  Prior to Admission medications   Medication Sig Start Date End Date Taking? Authorizing Provider  amitriptyline (ELAVIL) 25 MG tablet Take 25 mg by mouth at bedtime. 04/15/20   [provider]  ARIPiprazole (ABILIFY) 5 MG tablet Take 1 tablet by mouth daily. 12/02/19   [provider]  atenolol (TENORMIN) 50 MG tablet Take 50 mg by mouth daily. 11/06/19   [provider]  atorvastatin (LIPITOR) 80 MG tablet Take 80 mg by mouth daily. 08/01/18   [provider]  buPROPion [facility]  SR) 150 MG 12 hr tablet Take 150 mg by mouth 2 (two) times daily. 07/08/18   [provider]  Cholecalciferol (D2000 ULTRA STRENGTH) 50 MCG (2000 UT) CAPS Take 2,000 Units by mouth daily.    [provider]  desonide (DESOWEN) 0.05 % cream Apply topically. 05/07/13   [provider]  DULoxetine (CYMBALTA) 60 MG capsule Take 2 capsules by mouth daily. 10/31/19   [provider]  ezetimibe (ZETIA) 10 MG tablet Take 1 tablet by mouth daily. 09/07/17   [provider]  gabapentin (NEURONTIN) 300 MG capsule Take 300 mg by mouth 3 (three) times daily. 04/16/20   [provider]  modafinil (PROVIGIL) 200 MG tablet Take by mouth.    [provider]  Multiple Vitamins-Minerals (MULTIVITAMIN WITH MINERALS) tablet Take 1 tablet by mouth at bedtime.    [provider]  nitroGLYCERIN (NITROLINGUAL) 0.4 MG/SPRAY spray Place 1 spray under the tongue every 5 (five) minutes x 3 doses as needed for chest pain.    [provider]  omeprazole (PRILOSEC) 40 MG capsule Take 40 mg by mouth daily. 09/06/18   [provider]  oxyCODONE-acetaminophen (PERCOCET/ROXICET) 5-325 MG tablet Take 1 tablet by mouth every 6 (six) hours as needed for moderate pain.    [provider]  tamsulosin (FLOMAX) 0.4 MG CAPS capsule Take 0.4 mg by mouth at bedtime.    [provider]  topiramate (TOPAMAX) 50 MG tablet Take 50 mg by mouth at bedtime. 06/18/18   [provider]  vitamin A 3 MG (10000 UNITS) capsule Take 10,000 Units by mouth daily.    [provider]   Physical Exam: Vitals:   08/01/22 1315 08/01/22 1316 08/01/22 1317 08/01/22 1746  BP:   (!) 140/77 (!) 153/68  Pulse: (!) 105 (!) 107 (!) 102 98  Resp: (!) 22 (!) 22 20 18   Temp:   98.3 F (36.8 C) 98 F (36.7 C)  TempSrc:   Oral   SpO2: 95% 99% 99% 100%  Weight:      Height:       Constitutional: appears age-appropriate, frail, calm, does not appear to be in acute distress Eyes: PERRL, lids and conjunctivae normal ENMT: Mucous membranes are moist. Posterior pharynx clear of any exudate or lesions. Age-appropriate dentition. Hearing appropriate Neck: normal, supple, no masses, no thyromegaly Respiratory: clear to auscultation bilaterally, no wheezing, no crackles. Normal respiratory effort. No accessory muscle use.  Cardiovascular: Regular rate and rhythm, no murmurs / rubs / gallops. No extremity edema. 2+ pedal pulses. No carotid bruits.  Abdomen: Mildly obese abdomen, no tenderness, no masses palpated, no hepatosplenomegaly. Bowel sounds positive.   Musculoskeletal: no clubbing / cyanosis. No joint deformity upper and lower extremities. Good ROM, no contractures, no atrophy. Normal muscle tone.  Skin: no rashes, lesions, ulcers. No induration Neurologic: Sensation intact. Strength 5/5 in bilateral upper extremities.  Right leg is weaker than the left. Psychiatric: Normal judgment and insight. Alert and oriented x 3. Normal mood.   EKG: independently reviewed, showing sinus tachycardia with rate of 111, QTc 459, right bundle branch block.  Right bundle branch block present on prior EKGs specifically 12/15/2021.  Chest x-ray on Admission: I personally reviewed and I agree with radiologist reading as below.  DG Chest Portable 1 View  Result Date: 08/01/2022 CLINICAL DATA:  Weakness. Scratched at bilateral leg weakness. Recent progression. Tachycardia. EXAM: PORTABLE CHEST 1 VIEW COMPARISON:  None Available. FINDINGS: Heart size is normal. Lung volumes are  low. No edema or effusion is present. No focal airspace disease is present. IMPRESSION: 1. Low lung volumes. 2. No acute cardiopulmonary disease. Electronically Signed   By: Marin Roberts M.D.   On: 08/01/2022 10:42    Labs on Admission: I have personally reviewed following labs  CBC: Recent Labs  Lab 08/01/22 1010  WBC 10.0  HGB 15.8  HCT 49.2  MCV 89.6  PLT 203   Basic Metabolic Panel: Recent Labs  Lab 08/01/22 1218  NA 135  K 4.1  CL 103  CO2 22  GLUCOSE 97  BUN 13  CREATININE 0.97  CALCIUM 9.5   GFR: Estimated Creatinine Clearance: 66.4 mL/min (by C-G formula based on SCr of 0.97 mg/dL).  Liver Function Tests: Recent Labs  Lab 08/01/22 1218  AST 13*  ALT 20  ALKPHOS 80  BILITOT 0.7  PROT 6.7  ALBUMIN 4.0   Urine analysis:    Component Value Date/Time   COLORURINE YELLOW (A) 08/01/2022 1003   APPEARANCEUR CLEAR (A) 08/01/2022 1003   LABSPEC 1.013 08/01/2022 1003   PHURINE 6.0 08/01/2022 1003   GLUCOSEU NEGATIVE 08/01/2022 1003   HGBUR  NEGATIVE 08/01/2022 1003   BILIRUBINUR NEGATIVE 08/01/2022 1003   KETONESUR NEGATIVE 08/01/2022 1003   PROTEINUR NEGATIVE 08/01/2022 1003   NITRITE NEGATIVE 08/01/2022 1003   LEUKOCYTESUR NEGATIVE 08/01/2022 1003   This document was prepared using Dragon Voice Recognition software and may include unintentional dictation errors.  Dr. Sedalia Muta [facility]  If 7PM-7AM, please contact overnight-coverage provider If 7AM-7PM, please contact day attending provider [email or web address]  08/01/2022, 5:55 PM

## 2022-08-01 NOTE — Assessment & Plan Note (Signed)
PDMP reviewed Patient last had oxycodone 5 mg tablet, 7-day course ordered on 05/12/2022

## 2022-08-02 ENCOUNTER — Encounter: Payer: Self-pay | Admitting: Nurse Practitioner

## 2022-08-02 DIAGNOSIS — I480 Paroxysmal atrial fibrillation: Secondary | ICD-10-CM

## 2022-08-02 DIAGNOSIS — I10 Essential (primary) hypertension: Secondary | ICD-10-CM

## 2022-08-02 DIAGNOSIS — R270 Ataxia, unspecified: Secondary | ICD-10-CM | POA: Diagnosis not present

## 2022-08-02 DIAGNOSIS — G20C Parkinsonism, unspecified: Secondary | ICD-10-CM

## 2022-08-02 DIAGNOSIS — R4189 Other symptoms and signs involving cognitive functions and awareness: Secondary | ICD-10-CM | POA: Diagnosis not present

## 2022-08-02 DIAGNOSIS — U071 COVID-19: Secondary | ICD-10-CM | POA: Diagnosis not present

## 2022-08-02 DIAGNOSIS — Z9181 History of falling: Secondary | ICD-10-CM | POA: Diagnosis not present

## 2022-08-02 DIAGNOSIS — F32A Depression, unspecified: Secondary | ICD-10-CM

## 2022-08-02 DIAGNOSIS — K219 Gastro-esophageal reflux disease without esophagitis: Secondary | ICD-10-CM

## 2022-08-02 DIAGNOSIS — G894 Chronic pain syndrome: Secondary | ICD-10-CM

## 2022-08-02 MED ORDER — GABAPENTIN 300 MG PO CAPS: 300.0000 mg | ORAL_CAPSULE | Freq: Three times a day (TID) | ORAL | Status: DC

## 2022-08-02 MED ORDER — AMITRIPTYLINE HCL 25 MG PO TABS: 25.0000 mg | ORAL_TABLET | Freq: Every day | ORAL | Status: DC

## 2022-08-02 MED ORDER — PREDNISONE 50 MG PO TABS: ORAL_TABLET | ORAL | 0 refills | Status: DC

## 2022-08-02 MED ORDER — ATENOLOL 25 MG PO TABS
50.0000 mg | ORAL_TABLET | Freq: Every day | ORAL | Status: DC
  Administered 2022-08-02: 50 mg via ORAL
  Filled 2022-08-02: qty 2

## 2022-08-02 MED ORDER — ROSUVASTATIN CALCIUM 10 MG PO TABS
40.0000 mg | ORAL_TABLET | Freq: Every day | ORAL | Status: DC
  Administered 2022-08-02: 40 mg via ORAL
  Filled 2022-08-02: qty 4

## 2022-08-02 MED ORDER — GUAIFENESIN 100 MG/5ML PO LIQD: 5.0000 mL | ORAL | 0 refills | Status: DC | PRN

## 2022-08-02 MED ORDER — APIXABAN 5 MG PO TABS
5.0000 mg | ORAL_TABLET | Freq: Two times a day (BID) | ORAL | Status: DC
  Administered 2022-08-02: 5 mg via ORAL
  Filled 2022-08-02: qty 2

## 2022-08-02 MED ORDER — VITAMIN C 500 MG PO CHEW: CHEWABLE_TABLET | ORAL | 0 refills | Status: AC

## 2022-08-02 MED ORDER — EZETIMIBE 10 MG PO TABS
10.0000 mg | ORAL_TABLET | Freq: Every day | ORAL | Status: DC
  Administered 2022-08-02: 10 mg via ORAL
  Filled 2022-08-02: qty 1

## 2022-08-02 MED ORDER — ARIPIPRAZOLE 2 MG PO TABS
5.0000 mg | ORAL_TABLET | Freq: Every day | ORAL | Status: DC
  Administered 2022-08-02: 5 mg via ORAL
  Filled 2022-08-02: qty 3

## 2022-08-02 MED ORDER — DULOXETINE HCL 30 MG PO CPEP
60.0000 mg | ORAL_CAPSULE | Freq: Two times a day (BID) | ORAL | Status: DC
  Administered 2022-08-02: 60 mg via ORAL
  Filled 2022-08-02: qty 2

## 2022-08-02 MED ORDER — BENZONATATE 100 MG PO CAPS: 100.0000 mg | ORAL_CAPSULE | Freq: Two times a day (BID) | ORAL | 0 refills | Status: DC | PRN

## 2022-08-02 MED ORDER — TAMSULOSIN HCL 0.4 MG PO CAPS: 0.4000 mg | ORAL_CAPSULE | Freq: Every day | ORAL | 0 refills | Status: AC

## 2022-08-02 MED ORDER — SERTRALINE HCL 50 MG PO TABS
50.0000 mg | ORAL_TABLET | Freq: Every day | ORAL | Status: DC
  Administered 2022-08-02: 50 mg via ORAL
  Filled 2022-08-02: qty 1

## 2022-08-02 MED ORDER — BUPROPION HCL ER (SR) 150 MG PO TB12
150.0000 mg | ORAL_TABLET | Freq: Two times a day (BID) | ORAL | Status: DC
  Administered 2022-08-02: 150 mg via ORAL
  Filled 2022-08-02: qty 1

## 2022-08-02 MED ORDER — ZINC 220 (50 ZN) MG PO CAPS: ORAL_CAPSULE | ORAL | 1 refills | Status: AC

## 2022-08-02 NOTE — Discharge Summary (Addendum)
Physician Discharge Summary   Patient: William Keith MRN: [id number] DOB: 04-Feb-1940  Admit date:     08/01/2022  Discharge date: 08/02/22  Discharge Physician: Arnetha Courser   PCP: Marguarite Arbour, MD   Recommendations at discharge:  Please obtain CBC and BMP in 1 week Patient is COVID-positive, no respiratory symptoms, PCP can help with symptom management if needed Follow-up with primary care provider within a week  Discharge Diagnoses: Principal Problem:   At high risk for falls Active Problems:   Hypercholesteremia   Hypertension   Depression   Ataxia   Chronic pain syndrome   DDD (degenerative disc disease), lumbar   Diet-controlled type 2 diabetes mellitus (HCC)   Frequent falls   GERD (gastroesophageal reflux disease)   MGUS (monoclonal gammopathy of unknown significance)   Moderate episode of recurrent major depressive disorder (HCC)   Obesity (BMI 30-39.9)   Orthostatic hypotension   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 William Keith is a 82 year old male with depression, anxiety, neuropathy, hypertension, Parkinson's disease, GERD, BPH, hyperlipidemia, who presents to the emergency department for chief concerns of bilateral leg weakness.  Vitals in the ED showed temperature 97.6, respiration rate of 14, heart rate of 102, blood pressure 138/75, SpO2 of 98% on room air.  Serum Na is 135, K 4.1, chloride 103, bicarb 22, BUN of 13, serum creatinine of 0.97, nonfasting blood glucose 97, EGFR greater than 60, WBC 10, hemoglobin 15.8, platelets of 203. CXR was negative for any acute abnormality. High sensitive troponin was 5.  UA was negative for leukocytes and nitrates.     Patient tested positive for COVID by PCR.  Influenza A, influenza B, RSV by PCR were negative.  ED treatment: Sodium chloride 1 L bolus.  7/29: Patient remained stable.  Dizziness and weakness is most likely secondary to COVID infection.  Our physical therapist evaluated him and recommended home health.  No respiratory symptoms but stating that he might be started getting some congestion.  Chest x-ray clear.  Patient was advised to keep himself well-hydrated, plenty of rest.  He can use Robitussin or Tessalon Perles if needed for cough.  No need for steroid or any other antiviral at this time. Patient should follow-up with primary care provider in case he develops some symptoms for symptom management.  Patient is on multiple psych and pain medications which can also cause dizziness.  He will need a good med rec by PCP and back off on some of them as appropriate.  Per patient he was taking all of those medications but a lot of them with no recent confirmed or documented refills.  Home health services were ordered.  Patient will continue on current medications and need to have a close follow-up with his providers for further recommendations.  Assessment and Plan: * At high risk for falls Fall precautions, PT, OT Sodium chloride infusion at 100 mL/h, 1 day ordered Incentive spirometry and flutter valve, into 8-10 reps every 2 hours while awake, 45 days recommended Admit to telemetry medical, observation  COVID-19 virus infection Continue airborne and contact precaution Patient does not have respiratory symptoms at this time, I extensively discussed with patient and his spouse that should he develop cough, shortness  of breath, nausea, vomiting, or chest discomfort, patient to let nursing know so we can reevaluate for consideration of IV steroids. Patient repeated the above to me and endorsed understanding and compliance.  Cognitive impairment At baseline, patient was able to tell me his  name, age, location, current calendar year.  Primary parkinsonism Resumed home carbidopa-levodopa 25-100 mg tablet Per patient spouse, home dosing instructions are as follows: 8AM, 2.5 tablets. Noon, 2.5 tablets. 5 pm: 2.5 tablets.  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   Chronic pain syndrome PDMP reviewed Patient last had oxycodone 5 mg tablet, 7-day course ordered on 05/12/2022   Pain control - [facility] Controlled Substance Reporting System database was reviewed. and patient was instructed, not to drive, operate heavy machinery, perform activities at heights, swimming or participation in water activities or provide baby-sitting services while on Pain, Sleep and Anxiety Medications; until their outpatient Physician has advised to do so again. Also recommended to not to take more than prescribed Pain, Sleep and Anxiety Medications.  Consultants: None Procedures performed: None Disposition: Home health Diet recommendation:  Discharge Diet Orders (From admission, onward)     Start     Ordered   08/02/22 0000  Diet - low sodium heart healthy        08/02/22 1140           Cardiac diet DISCHARGE MEDICATION: Allergies as of 08/02/2022       Reactions   Dilaudid [hydromorphone Hcl] Other (See Comments)   hallucination        Medication List     STOP taking these medications    modafinil 200 MG tablet Commonly known as: PROVIGIL   vitamin A 3 MG (10000 UNITS) capsule       TAKE these medications    amitriptyline 25 MG tablet Commonly known as: ELAVIL Take 25 mg by mouth at bedtime.   apixaban 5 MG Tabs  tablet Commonly known as: ELIQUIS Take 5 mg by mouth 2 (two) times daily.   ARIPiprazole 5 MG tablet Commonly known as: ABILIFY Take 1 tablet by mouth daily.   atenolol 50 MG tablet Commonly known as: TENORMIN Take 50 mg by mouth daily.   benzonatate 100 MG capsule Commonly known as: TESSALON Take 1 capsule (100 mg total) by mouth 2 (two) times daily as needed for cough.   buPROPion 150 MG 12 hr tablet Commonly known as: WELLBUTRIN SR Take 150 mg by mouth 2 (two) times daily.   carbidopa-levodopa 25-100 MG tablet Commonly known as: SINEMET IR Take 2.5 tablets by mouth 3 (three) times daily.   D2000 Ultra Strength 50 MCG (2000 UT) Caps Generic drug: Cholecalciferol Take 2,000 Units by mouth daily.   desonide 0.05 % cream Commonly known as: DESOWEN Apply topically daily as needed.   DULoxetine 60 MG capsule Commonly known as: CYMBALTA Take 1 capsule by mouth 2 (two) times daily.   ezetimibe 10 MG tablet Commonly known as: ZETIA Take 1 tablet by mouth daily.   gabapentin 300 MG capsule Commonly known as: NEURONTIN Take 300 mg by mouth 3 (three) times daily.   guaiFENesin 100 MG/5ML liquid Commonly known as: ROBITUSSIN Take 5 mLs by mouth every 4 (  four) hours as needed for to loosen phlegm.   multivitamin with minerals tablet Take 1 tablet by mouth at bedtime.   nitroGLYCERIN 0.4 MG/SPRAY spray Commonly known as: NITROLINGUAL Place 1 spray under the tongue every 5 (five) minutes x 3 doses as needed for chest pain.   omeprazole 40 MG capsule Commonly known as: PRILOSEC Take 40 mg by mouth daily.   oxybutynin 10 MG 24 hr tablet Commonly known as: DITROPAN-XL Take 10 mg by mouth at bedtime.   oxyCODONE-acetaminophen 5-325 MG tablet Commonly known as: PERCOCET/ROXICET Take 1 tablet by mouth every 6 (six) hours as needed for moderate pain.   predniSONE 50 MG tablet Commonly known as: DELTASONE One tablet daily for 5 days.   rosuvastatin 40 MG  tablet Commonly known as: CRESTOR Take 40 mg by mouth daily.   sertraline 50 MG tablet Commonly known as: ZOLOFT Take 50 mg by mouth daily.   tamsulosin 0.4 MG Caps capsule Commonly known as: FLOMAX Take 1 capsule (0.4 mg total) by mouth at bedtime.   topiramate 50 MG tablet Commonly known as: TOPAMAX Take 50 mg by mouth at bedtime.   Vitamin C 500 MG Chew 1 daily   Zinc 220 (50 Zn) MG Caps Take 1 tablet daily        Follow-up Information     Marguarite Arbour, MD. Go on 08/09/2022.   Specialty: Internal Medicine Why: Appt @ 1:45 pm w/ Debbra Riding, PA-C Contact information: [address] [facility] [place] [address] [phone]                Discharge Exam: Ceasar Mons Weights   08/01/22 0935  Weight: 97.1 kg   General.  Obese elderly man, in no acute distress. Pulmonary.  Lungs clear bilaterally, normal respiratory effort. CV.  Regular rate and rhythm, no JVD, rub or murmur. Abdomen.  Soft, nontender, nondistended, BS positive. CNS.  Alert and oriented .  No focal neurologic deficit. Extremities.  No edema, no cyanosis, pulses intact and symmetrical. Psychiatry.  Judgment and insight appears normal.   Condition at discharge: stable  The results of significant diagnostics from this hospitalization (including imaging, microbiology, ancillary and laboratory) are listed below for reference.   Imaging Studies: DG Chest Portable 1 View  Result Date: 08/01/2022 CLINICAL DATA:  Weakness. Scratched at bilateral leg weakness. Recent progression. Tachycardia. EXAM: PORTABLE CHEST 1 VIEW COMPARISON:  None Available. FINDINGS: Heart size is normal. Lung volumes are low. No edema or effusion is present. No focal airspace disease is present. IMPRESSION: 1. Low lung volumes. 2. No acute cardiopulmonary disease. Electronically Signed   By: Marin Roberts M.D.   On: 08/01/2022 10:42    Microbiology: Results for orders placed or performed during  the hospital encounter of 08/01/22  Resp panel by RT-PCR (RSV, Flu A&B, Covid) Anterior Nasal Swab     Status: Abnormal   Collection Time: 08/01/22 10:10 AM   Specimen: Anterior Nasal Swab  Result Value Ref Range Status   SARS Coronavirus 2 by RT PCR POSITIVE (A) NEGATIVE Final    Comment: (NOTE) SARS-CoV-2 target nucleic acids are DETECTED.  The SARS-CoV-2 RNA is generally detectable in upper respiratory specimens during the acute phase of infection. Positive results are indicative of the presence of the identified virus, but do not rule out bacterial infection or co-infection with other pathogens not detected by the test. Clinical correlation with patient history and other diagnostic information is necessary to determine patient infection status. The expected  result is Negative.  Fact Sheet for Patients: BloggerCourse.com  Fact Sheet for Healthcare Providers: SeriousBroker.it  This test is not yet approved or cleared by the [place] FDA and  has been authorized for detection and/or diagnosis of SARS-CoV-2 by FDA under an Emergency Use Authorization (EUA).  This EUA will remain in effect (meaning this test can be used) for the duration of  the COVID-19 declaration under Section 564(b)(1) of the A ct, 21 U.S.C. section 360bbb-3(b)(1), unless the authorization is terminated or revoked sooner.     Influenza A by PCR NEGATIVE NEGATIVE Final   Influenza B by PCR NEGATIVE NEGATIVE Final    Comment: (NOTE) The Xpert Xpress SARS-CoV-2/FLU/RSV plus assay is intended as an aid in the diagnosis of influenza from Nasopharyngeal swab specimens and should not be used as a sole basis for treatment. Nasal washings and aspirates are unacceptable for Xpert Xpress SARS-CoV-2/FLU/RSV testing.  Fact Sheet for Patients: BloggerCourse.com  Fact Sheet for Healthcare  Providers: SeriousBroker.it  This test is not yet approved or cleared by the [place] FDA and has been authorized for detection and/or diagnosis of SARS-CoV-2 by FDA under an Emergency Use Authorization (EUA). This EUA will remain in effect (meaning this test can be used) for the duration of the COVID-19 declaration under Section 564(b)(1) of the Act, 21 U.S.C. section 360bbb-3(b)(1), unless the authorization is terminated or revoked.     Resp Syncytial Virus by PCR NEGATIVE NEGATIVE Final    Comment: (NOTE) Fact Sheet for Patients: BloggerCourse.com  Fact Sheet for Healthcare Providers: SeriousBroker.it  This test is not yet approved or cleared by the [place] FDA and has been authorized for detection and/or diagnosis of SARS-CoV-2 by FDA under an Emergency Use Authorization (EUA). This EUA will remain in effect (meaning this test can be used) for the duration of the COVID-19 declaration under Section 564(b)(1) of the Act, 21 U.S.C. section 360bbb-3(b)(1), unless the authorization is terminated or revoked.  Performed at [facility], [address] [place], [place] [address]     Labs: CBC: Recent Labs  Lab 08/01/22 1010 08/02/22 0540  WBC 10.0 8.2  HGB 15.8 14.3  HCT 49.2 44.0  MCV 89.6 89.6  PLT 203 180   Basic Metabolic Panel: Recent Labs  Lab 08/01/22 1218 08/02/22 0540  NA 135 133*  K 4.1 3.8  CL 103 105  CO2 22 21*  GLUCOSE 97 97  BUN 13 10  CREATININE 0.97 0.76  CALCIUM 9.5 8.4*   Liver Function Tests: Recent Labs  Lab 08/01/22 1218  AST 13*  ALT 20  ALKPHOS 80  BILITOT 0.7  PROT 6.7  ALBUMIN 4.0   CBG: No results for input(s): "GLUCAP" in the last 168 hours.  Discharge time spent: greater than 30 minutes.  Signed: Arnetha Courser, MD [facility] 08/02/2022

## 2022-08-02 NOTE — Care Management Obs Status (Signed)
MEDICARE OBSERVATION STATUS NOTIFICATION   Patient Details  Name: William Keith MRN: [id number] Date of Birth: 08-03-1940   Medicare Observation Status Notification Given:  Yes    Marlowe Sax, RN 08/02/2022, 9:46 AM

## 2022-08-02 NOTE — Evaluation (Signed)
Physical Therapy Evaluation Patient Details Name: William Keith MRN: [id number] DOB: 05-21-1940 Today's Date: 08/02/2022  History of Present Illness  Pt is an 82 y/o M admitted on 08/01/22 after presenting with c/o BLE weakness that has been getting progressively worse. Pt tested positive for COVID 19. PMH: depression, anxiety, neuropathy, HTN, Parkinson's disease, GERD, BPH, HLD  Clinical Impression  Pt seen for PT evaluation with pt agreeable. Pt reports prior to admission he was living with his wife in ILF apartment, ambulatory with rollator but endorses 5-6 falls in the past 6 months. On this date, pt is able to complete bed mobility with supervision, transfers with CGA<>close supervision, and ambulate in room with RW. Pt endorses dizziness, see below for BP. PT encouraged OOB mobility, hydrating/consuming water.     BP in LUE Sitting: 127/61 mmHg MAP 78 Standing: 108/59 mmHg MAP 70    If plan is discharge home, recommend the following: A little help with bathing/dressing/bathroom;Assistance with cooking/housework;A little help with walking and/or transfers;Assist for transportation   Can travel by private vehicle        Equipment Recommendations None recommended by PT  Recommendations for Other Services       Functional Status Assessment Patient has had a recent decline in their functional status and demonstrates the ability to make significant improvements in function in a reasonable and predictable amount of time.     Precautions / Restrictions Precautions Precautions: Fall Restrictions Weight Bearing Restrictions: No      Mobility  Bed Mobility Overal bed mobility: Needs Assistance Bed Mobility: Supine to Sit     Supine to sit: HOB elevated, Supervision     General bed mobility comments: use of bed rails    Transfers Overall transfer level: Needs assistance Equipment used: Rolling walker (2 wheels) Transfers: Sit to/from Stand, Bed to  chair/wheelchair/BSC Sit to Stand: Supervision, Min guard   Step pivot transfers: Min guard       General transfer comment: STS from EOB, recliner    Ambulation/Gait Ambulation/Gait assistance: Min guard Gait Distance (Feet): 30 Feet Assistive device: Rolling walker (2 wheels) Gait Pattern/deviations: Decreased step length - left, Decreased step length - right, Decreased stride length Gait velocity: decreased        Stairs            Wheelchair Mobility     Tilt Bed    Modified Rankin (Stroke Patients Only)       Balance Overall balance assessment: Needs assistance, History of Falls Sitting-balance support: Feet supported Sitting balance-Leahy Scale: Good     Standing balance support: During functional activity, Bilateral upper extremity supported Standing balance-Leahy Scale: Fair                               Pertinent Vitals/Pain Pain Assessment Pain Assessment: No/denies pain    Home Living Family/patient expects to be discharged to:: Private residence (ILF apartment) Living Arrangements: Spouse/significant other Available Help at Discharge: Family Type of Home: Apartment (ILF apartment) Home Access: Level entry       Home Layout: One level Home Equipment: Rollator (4 wheels)      Prior Function Prior Level of Function : Independent/Modified Independent             Mobility Comments: ambulatory with rollator, endorses 5-6 falls in the past 6 months 2/2 "tripping over his own feet"       Hand Dominance  Extremity/Trunk Assessment   Upper Extremity Assessment Upper Extremity Assessment: Overall WFL for tasks assessed    Lower Extremity Assessment Lower Extremity Assessment: Overall WFL for tasks assessed;Generalized weakness       Communication   Communication: No difficulties  Cognition Arousal/Alertness: Awake/alert Behavior During Therapy: WFL for tasks assessed/performed Overall Cognitive Status:  Within Functional Limits for tasks assessed                                          General Comments General comments (skin integrity, edema, etc.): Pt return demonstrated use of incentive spirometer & acapella flutter valve    Exercises     Assessment/Plan    PT Assessment Patient needs continued PT services  PT Problem List Decreased strength;Decreased activity tolerance;Decreased balance;Decreased mobility       PT Treatment Interventions DME instruction;Therapeutic exercise;Gait training;Balance training;Neuromuscular re-education;Functional mobility training;Therapeutic activities;Patient/family education    PT Goals (Current goals can be found in the Care Plan section)  Acute Rehab PT Goals Patient Stated Goal: feel better PT Goal Formulation: With patient Time For Goal Achievement: 08/16/22 Potential to Achieve Goals: Good    Frequency Min 1X/week     Co-evaluation PT/OT/SLP Co-Evaluation/Treatment: Yes Reason for Co-Treatment: Other (comment) (pt anticipating d/c soon) PT goals addressed during session: Mobility/safety with mobility;Proper use of DME         AM-PAC PT "6 Clicks" Mobility  Outcome Measure Help needed turning from your back to your side while in a flat bed without using bedrails?: None Help needed moving from lying on your back to sitting on the side of a flat bed without using bedrails?: A Little Help needed moving to and from a bed to a chair (including a wheelchair)?: A Little Help needed standing up from a chair using your arms (e.g., wheelchair or bedside chair)?: A Little Help needed to walk in hospital room?: A Little Help needed climbing 3-5 steps with a railing? : A Little 6 Click Score: 19    End of Session   Activity Tolerance: Patient tolerated treatment well Patient left: in chair;with chair alarm set;with call bell/phone within reach Nurse Communication: Mobility status (c/o dizziness, BP) PT Visit Diagnosis:  Muscle weakness (generalized) (M62.81);History of falling (Z91.81);Unsteadiness on feet (R26.81)    Time: 9147-8295 PT Time Calculation (min) (ACUTE ONLY): 27 min   Charges:   PT Evaluation $PT Eval Low Complexity: 1 Low   PT General Charges $$ ACUTE PT VISIT: 1 Visit         Aleda Grana, PT, DPT 08/02/22, 12:26 PM   Sandi Mariscal 08/02/2022, 12:24 PM

## 2022-08-02 NOTE — TOC Progression Note (Signed)
Transition of Care [facility]) - Progression Note    Patient Details  Name: William Keith MRN: [id number] Date of Birth: 1940/10/24  [facility] CM/SW Contact  Marlowe Sax, RN Phone Number: 08/02/2022, 12:16 PM  Clinical Narrative:     Faxed HH orders to [facility] [place] for [facility] PT, OT, RN  Expected Discharge Plan: Skilled Nursing Facility Barriers to Discharge: Continued Medical Work up  Expected Discharge Plan and Services   Discharge Planning Services: CM Consult   Living arrangements for the past 2 months: Apartment Expected Discharge Date: 08/02/22               DME Arranged: N/A DME Agency: NA                   Social Determinants of Health (SDOH) Interventions SDOH Screenings   Food Insecurity: No Food Insecurity (08/01/2022)  Housing: Low Risk  (08/01/2022)  Transportation Needs: No Transportation Needs (08/01/2022)  Utilities: Not At Risk (08/01/2022)  Financial Resource Strain: Low Risk  (03/20/2019)   Received from [facility], [facility]  Physical Activity: Inactive (03/20/2019)   Received from [facility], [facility]  Social Connections: Socially Isolated (03/20/2019)   Received from [facility], [facility]  Stress: Stress Concern Present (03/20/2019)   Received from [facility], [facility]  Tobacco Use: High Risk (08/01/2022)    Readmission Risk Interventions     No data to display

## 2022-08-02 NOTE — TOC Progression Note (Signed)
Transition of Care [facility]) - Progression Note    Patient Details  Name: William Keith MRN: [id number] Date of Birth: 1940-02-17  Transition of Care [facility]) CM/SW Contact  Marlowe Sax, RN Phone Number: 08/02/2022, 9:40 AM  Clinical Narrative:    Spoke with the patient's wife William Keith and she reports that the patient usually walks using a rollator, He has been doing PT at the [place] they live and will continue She is concerned that he is not going to be strong enough walking to come home, In that case he would need to go to STR, I explained that currently He is under Observation status and that under Medicare, STR will not be covered, I requested a UR review to determine if he would meet criteria for Inpatient, they notified me that he does not, Plan will be to DC home with [facility] PT   Expected Discharge Plan: Skilled Nursing Facility Barriers to Discharge: Continued Medical Work up  Expected Discharge Plan and Services   Discharge Planning Services: CM Consult   Living arrangements for the past 2 months: Apartment                 DME Arranged: N/A DME Agency: NA                   Social Determinants of Health (SDOH) Interventions SDOH Screenings   Food Insecurity: No Food Insecurity (08/01/2022)  Housing: Low Risk  (08/01/2022)  Transportation Needs: No Transportation Needs (08/01/2022)  Utilities: Not At Risk (08/01/2022)  Financial Resource Strain: Low Risk  (03/20/2019)   Received from [facility], [facility]  Physical Activity: Inactive (03/20/2019)   Received from [facility], [facility]  Social Connections: Socially Isolated (03/20/2019)   Received from [facility], [facility]  Stress: Stress Concern Present (03/20/2019)   Received from [facility], [facility]  Tobacco Use: High Risk (08/01/2022)     Readmission Risk Interventions     No data to display

## 2022-08-02 NOTE — Evaluation (Addendum)
Occupational Therapy Evaluation Patient Details Name: William Keith MRN: [id number] DOB: Apr 30, 1940 Today's Date: 08/02/2022   History of Present Illness Pt is an 82 year old male presenting to the ED with BLE weakness, pt tested positive for COVID 19; PMH significant for  anxiety, neuropathy, hypertension, Parkinson's disease, GERD, BPH, hyperlipidemia   Clinical Impression   Chart reviewed, pt greeted in bed, alert and oriented x4, agreeable to OT evaluation. Pt is an imminent dc order therefore co tx completed with PT. PTA pt lives in ILF, has fallen 5-6x in the last few months, generally MOD I in ADL. Pt presents with deficits in activity tolerance, balance, strength all affecting safe and optimal ADL completion.Pt will benefit from ongoing skilled OT to address functional deficits and to facilitate return to PLOF. OT will follow acutely.   Of note: pt c/o dizziness with positional changes BP in LUE Sitting: 127/61 mmHg MAP 78 Standing: 108/59 mmHg MAP 70 Team notified.      Recommendations for follow up therapy are one component of a multi-disciplinary discharge planning process, led by the attending physician.  Recommendations may be updated based on patient status, additional functional criteria and insurance authorization.   Assistance Recommended at Discharge Frequent or constant Supervision/Assistance  Patient can return home with the following A little help with walking and/or transfers;A little help with bathing/dressing/bathroom;Direct supervision/assist for medications management;Assist for transportation;Help with stairs or ramp for entrance    Functional Status Assessment  Patient has had a recent decline in their functional status and demonstrates the ability to make significant improvements in function in a reasonable and predictable amount of time.  Equipment Recommendations  None recommended by OT (discussed mwc use if needed, pt reports he will use his rollator)     Recommendations for Other Services       Precautions / Restrictions Precautions Precautions: Fall Restrictions Weight Bearing Restrictions: No      Mobility Bed Mobility Overal bed mobility: Needs Assistance Bed Mobility: Supine to Sit     Supine to sit: HOB elevated, Supervision          Transfers Overall transfer level: Needs assistance Equipment used: Rolling walker (2 wheels) Transfers: Sit to/from Stand, Bed to chair/wheelchair/BSC Sit to Stand: Supervision, Min guard                  Balance Overall balance assessment: Needs assistance, History of Falls Sitting-balance support: Feet supported Sitting balance-Leahy Scale: Good     Standing balance support: During functional activity, Bilateral upper extremity supported Standing balance-Leahy Scale: Fair                             ADL either performed or assessed with clinical judgement   ADL Overall ADL's : Needs assistance/impaired     Grooming: Wash/dry hands;Wash/dry face;Set up;Sitting               Lower Body Dressing: Set up Lower Body Dressing Details (indicate cue type and reason): shoes Toilet Transfer: Min guard;Ambulation Toilet Transfer Details (indicate cue type and reason): simulated with RW         Functional mobility during ADLs: Supervision/safety;Min guard;Rolling walker (2 wheels) (household distances in room)       Vision Patient Visual Report: No change from baseline       Perception     Praxis      Pertinent Vitals/Pain Pain Assessment Pain Assessment: No/denies pain     Hand  Dominance     Extremity/Trunk Assessment Upper Extremity Assessment Upper Extremity Assessment: Overall WFL for tasks assessed (mild tremor noted throughout BUE)   Lower Extremity Assessment Lower Extremity Assessment: Overall WFL for tasks assessed;Generalized weakness   Cervical / Trunk Assessment Cervical / Trunk Assessment: Normal   Communication  Communication Communication: No difficulties   Cognition Arousal/Alertness: Awake/alert Behavior During Therapy: WFL for tasks assessed/performed Overall Cognitive Status: Within Functional Limits for tasks assessed                                       General Comments  good demo of spirometer and flutter valve    Exercises Other Exercises Other Exercises: edu re: role of OT, role of rehab,discussed/educated importance of DME use, safe ADL completion   Shoulder Instructions      Home Living Family/patient expects to be discharged to:: Private residence (ILF apartment at [facility] of [place]) Living Arrangements: Spouse/significant other Available Help at Discharge: Family Type of Home: Apartment Home Access: Level entry     Home Layout: One level     Bathroom Shower/Tub: Producer, television/film/video: Handicapped height Bathroom Accessibility: Yes   Home Equipment: Rollator (4 wheels);BSC/3in1          Prior Functioning/Environment Prior Level of Function : Independent/Modified Independent;History of Falls (last six months)             Mobility Comments: amb with rollator ADLs Comments: generally MOD I with ADL, assist for IADL as needed        OT Problem List: Decreased strength;Decreased activity tolerance;Decreased knowledge of use of DME or AE;Impaired balance (sitting and/or standing)      OT Treatment/Interventions: Self-care/ADL training;Energy conservation;Balance training;Therapeutic exercise;DME and/or AE instruction;Therapeutic activities;Patient/family education    OT Goals(Current goals can be found in the care plan section) Acute Rehab OT Goals Patient Stated Goal: feel better OT Goal Formulation: With patient Time For Goal Achievement: 08/16/22 Potential to Achieve Goals: Good ADL Goals Pt Will Perform Grooming: with modified independence;standing;sitting Pt Will Perform Lower Body Dressing: with modified  independence;sit to/from stand Pt Will Transfer to Toilet: with modified independence;ambulating Pt Will Perform Toileting - Clothing Manipulation and hygiene: with modified independence;sit to/from stand  OT Frequency: Min 1X/week    Co-evaluation PT/OT/SLP Co-Evaluation/Treatment: Yes Reason for Co-Treatment:  (imminent dc, covid +) PT goals addressed during session: Mobility/safety with mobility;Proper use of DME OT goals addressed during session: ADL's and self-care      AM-PAC OT "6 Clicks" Daily Activity     Outcome Measure Help from another person eating meals?: None Help from another person taking care of personal grooming?: None Help from another person toileting, which includes using toliet, bedpan, or urinal?: None Help from another person bathing (including washing, rinsing, drying)?: A Little Help from another person to put on and taking off regular upper body clothing?: None Help from another person to put on and taking off regular lower body clothing?: None 6 Click Score: 23   End of Session Equipment Utilized During Treatment: Rolling walker (2 wheels)  Activity Tolerance: Patient tolerated treatment well Patient left: in chair;with call bell/phone within reach;with chair alarm set  OT Visit Diagnosis: Other abnormalities of gait and mobility (R26.89);Unsteadiness on feet (R26.81)                Time: 4403-4742 OT Time Calculation (min): 27 min Charges:  OT General  Charges $OT Visit: 1 Visit OT Evaluation $OT Eval Moderate Complexity: 1 Mod Oleta Mouse, OTD OTR/L  08/02/22, 1:24 PM

## 2022-08-02 NOTE — Plan of Care (Signed)

## 2022-10-19 ENCOUNTER — Encounter: Payer: Self-pay | Admitting: Nurse Practitioner

## 2023-01-10 ENCOUNTER — Ambulatory Visit: Payer: Medicare Other | Admitting: Urology

## 2023-01-10 ENCOUNTER — Encounter: Payer: Self-pay | Admitting: Nurse Practitioner

## 2023-01-10 VITALS — BP 89/52 | HR 79 | Ht 67.0 in | Wt 200.0 lb

## 2023-01-10 DIAGNOSIS — R300 Dysuria: Secondary | ICD-10-CM | POA: Diagnosis not present

## 2023-01-10 LAB — URINALYSIS, COMPLETE
Bilirubin, UA: NEGATIVE
Glucose, UA: NEGATIVE
Leukocytes,UA: NEGATIVE
Nitrite, UA: NEGATIVE
RBC, UA: NEGATIVE
Specific Gravity, UA: 1.025 (ref 1.005–1.030)
Urobilinogen, Ur: 1 mg/dL (ref 0.2–1.0)
pH, UA: 6 (ref 5.0–7.5)

## 2023-01-10 LAB — MICROSCOPIC EXAMINATION

## 2023-01-10 NOTE — Progress Notes (Signed)
 I,William Keith,acting as a scribe for [name], MD.,have documented all relevant documentation on the behalf of [name], MD,as directed by  [name], MD while in the presence of [name], MD.  01/10/2023 8:28 PM   William Keith 11-04-1940 [id number]  Referring provider: Suzzane Lamar, PA-C [address] [facility] [address] [facility] [place],  [place] [address]  Chief Complaint  Patient presents with   Dysuria    HPI: William Keith is a 83 y.o. male referred for evaluation of dysuria. He presents today with his wife.  Onset dysuria early November 2024. Urinalysis was ordered which showed 9 RBCs/ 6 WBCs. Urinalysis at that visit showed mild pyuria/ microhematuria. A urine culture was ordered which had insignificant growth. He was treated with a seven-day course of Cipro without improvement in his symptoms. Follow-up visit 12/22/22 complaining of dysuria and urinalysis showed pyuria. Repeat urine culture had insignificant growth. He states his dysuria is better but still present. It is usually worse in the morning and at the beginning of urination.  No gross hematuria. 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 He is on extended release of Oxybutynin  10 mg daily.   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 If you have any questions, ask your nurse or doctor.          amitriptyline  25 MG tablet Commonly known as: ELAVIL  Take 25 mg by mouth at bedtime.   apixaban  5 MG Tabs tablet Commonly known as: ELIQUIS  Take 5 mg by mouth 2 (two) times daily.   ARIPiprazole  5 MG tablet Commonly known as: ABILIFY  Take 1 tablet by mouth daily.   atenolol  50 MG tablet Commonly known as: TENORMIN  Take 50 mg by mouth daily.   benzonatate  100 MG capsule Commonly known as: TESSALON  Take 1 capsule (100 mg total) by mouth 2 (two) times daily as needed for cough.   buPROPion  150 MG 12 hr tablet Commonly known as: WELLBUTRIN  SR Take 150 mg by mouth 2 (two) times daily.   carbidopa -levodopa  25-100 MG tablet Commonly known as: SINEMET  IR Take 2.5 tablets by mouth 3 (three) times daily.   D2000 Ultra Strength 50 MCG (2000 UT) Caps Generic drug: Cholecalciferol  Take 2,000 Units by mouth daily.   desonide 0.05 % cream Commonly known as: DESOWEN Apply topically daily as needed.   DULoxetine  60 MG capsule Commonly known as: CYMBALTA  Take 1 capsule by mouth 2 (two) times daily.   ezetimibe  10 MG tablet Commonly known as: ZETIA  Take 1 tablet by mouth daily.   gabapentin  300 MG capsule Commonly known as: NEURONTIN  Take 300 mg by mouth 3 (three)  times daily.   guaiFENesin  100 MG/5ML liquid Commonly known as: ROBITUSSIN Take 5 mLs by mouth every 4 (four) hours as needed for to loosen phlegm.   multivitamin with minerals tablet Take 1 tablet by mouth at bedtime.   nitroGLYCERIN  0.4 MG/SPRAY spray Commonly known as: NITROLINGUAL  Place 1 spray under the tongue every 5 (five) minutes x 3 doses as needed for chest pain.   omeprazole 40 MG capsule Commonly known as: PRILOSEC Take 40 mg by mouth daily.   oxybutynin  10 MG 24 hr tablet Commonly known as: DITROPAN -XL Take 10 mg by mouth at bedtime.   oxyCODONE -acetaminophen   5-325 MG tablet Commonly known as: PERCOCET/ROXICET Take 1 tablet by mouth every 6 (six) hours as needed for moderate pain.   predniSONE  50 MG tablet Commonly known as: DELTASONE  One tablet daily for 5 days.   rosuvastatin  40 MG tablet Commonly known as: CRESTOR  Take 40 mg by mouth daily.   sertraline  50 MG tablet Commonly known as: ZOLOFT  Take 50 mg by mouth daily.   tamsulosin  0.4 MG Caps capsule Commonly known as: FLOMAX  Take 1 capsule (0.4 mg total) by mouth at bedtime.   topiramate  50 MG tablet Commonly known as: TOPAMAX  Take 50 mg by mouth at bedtime.   Vitamin C  500 MG Chew 1 daily   Zinc  220 (50 Zn) MG Caps Take 1 tablet daily        Allergies:  Allergies  Allergen Reactions   Dilaudid [Hydromorphone Hcl] Other (See Comments)    hallucination    Family History: Family History  Problem Relation Age of Onset   Lung cancer Mother    Alcohol abuse Mother    Sleep apnea Father    Macular degeneration Father     Social History:  reports that he has been smoking pipe. He has never used smokeless tobacco. He reports that he does not currently use alcohol. He reports that he does not use drugs.   Physical Exam: BP (!) 89/52   Pulse 79   Ht 5' 7 (1.702 m)   Wt 200 lb (90.7 kg)   BMI 31.32 kg/m   Constitutional:  Alert and oriented, No acute distress. HEENT: [place] AT Respiratory: Normal respiratory effort, no increased work of breathing. Psychiatric: Normal mood and affect.   Assessment & Plan:    1. Dysuria He has had pyuria, minimal microhematuria, but negative urine cultures X2. Schedule cystoscopy for evaluation of his lower urinary tract.   I have reviewed the above documentation for accuracy and completeness, and I agree with the above.   [name], MD  [facility] [address], [address] [place], [place] [address] [phone]

## 2023-01-16 ENCOUNTER — Encounter: Payer: Self-pay | Admitting: Urology

## 2023-01-31 ENCOUNTER — Ambulatory Visit: Payer: Medicare Other | Admitting: Urology

## 2023-02-11 ENCOUNTER — Other Ambulatory Visit: Payer: Medicare Other | Admitting: Urology

## 2023-02-14 ENCOUNTER — Emergency Department
Admission: EM | Admit: 2023-02-14 | Discharge: 2023-02-14 | Disposition: A | Discharge status: Home / Self Care | Payer: Medicare Other | Attending: Emergency Medicine | Admitting: Emergency Medicine

## 2023-02-14 ENCOUNTER — Other Ambulatory Visit: Payer: Self-pay

## 2023-02-14 ENCOUNTER — Encounter: Payer: Self-pay | Admitting: Emergency Medicine

## 2023-02-14 ENCOUNTER — Ambulatory Visit: Payer: Self-pay

## 2023-02-14 ENCOUNTER — Emergency Department: Payer: Medicare Other

## 2023-02-14 DIAGNOSIS — I4891 Unspecified atrial fibrillation: Secondary | ICD-10-CM | POA: Insufficient documentation

## 2023-02-14 DIAGNOSIS — E119 Type 2 diabetes mellitus without complications: Secondary | ICD-10-CM | POA: Insufficient documentation

## 2023-02-14 DIAGNOSIS — G20C Parkinsonism, unspecified: Secondary | ICD-10-CM | POA: Insufficient documentation

## 2023-02-14 DIAGNOSIS — I10 Essential (primary) hypertension: Secondary | ICD-10-CM | POA: Diagnosis not present

## 2023-02-14 DIAGNOSIS — R55 Syncope and collapse: Secondary | ICD-10-CM | POA: Insufficient documentation

## 2023-02-14 DIAGNOSIS — E860 Dehydration: Secondary | ICD-10-CM | POA: Insufficient documentation

## 2023-02-14 DIAGNOSIS — Z20822 Contact with and (suspected) exposure to covid-19: Secondary | ICD-10-CM | POA: Insufficient documentation

## 2023-02-14 DIAGNOSIS — I6782 Cerebral ischemia: Secondary | ICD-10-CM | POA: Insufficient documentation

## 2023-02-14 LAB — TROPONIN I (HIGH SENSITIVITY): Troponin I (High Sensitivity): 5 ng/L (ref <18)

## 2023-02-14 LAB — BASIC METABOLIC PANEL
Anion gap: 10 (ref 5–15)
BUN: 10 mg/dL (ref 8–23)
CO2: 28 mmol/L (ref 22–32)
Calcium: 9.9 mg/dL (ref 8.9–10.3)
Chloride: 102 mmol/L (ref 98–111)
Creatinine, Ser: 0.79 mg/dL (ref 0.61–1.24)
GFR, Estimated: >60 mL/min (ref >60)
Glucose, Bld: 114 mg/dL — ABNORMAL HIGH (ref 70–99)
Potassium: 4.1 mmol/L (ref 3.5–5.1)
Sodium: 140 mmol/L (ref 135–145)

## 2023-02-14 LAB — URINALYSIS, ROUTINE W REFLEX MICROSCOPIC
Bilirubin Urine: NEGATIVE
Glucose, UA: NEGATIVE mg/dL
Hgb urine dipstick: NEGATIVE
Ketones, ur: NEGATIVE mg/dL
Leukocytes,Ua: NEGATIVE
Nitrite: NEGATIVE
Protein, ur: NEGATIVE mg/dL
Specific Gravity, Urine: 1.012 (ref 1.005–1.030)
pH: 6 (ref 5.0–8.0)

## 2023-02-14 LAB — CBC
HCT: 50.8 % (ref 39.0–52.0)
Hemoglobin: 16.3 g/dL (ref 13.0–17.0)
MCH: 29.4 pg (ref 26.0–34.0)
MCHC: 32.1 g/dL (ref 30.0–36.0)
MCV: 91.7 fL (ref 80.0–100.0)
Platelets: 245 10*3/uL (ref 150–400)
RBC: 5.54 MIL/uL (ref 4.22–5.81)
RDW: 13.3 % (ref 11.5–15.5)
WBC: 10.1 10*3/uL (ref 4.0–10.5)
nRBC: 0 % (ref 0.0–0.2)

## 2023-02-14 LAB — RESP PANEL BY RT-PCR (RSV, FLU A&B, COVID)  RVPGX2
Influenza A by PCR: NEGATIVE
Influenza B by PCR: NEGATIVE
Resp Syncytial Virus by PCR: NEGATIVE
SARS Coronavirus 2 by RT PCR: NEGATIVE

## 2023-02-14 LAB — CBG MONITORING, ED: Glucose-Capillary: 137 mg/dL — ABNORMAL HIGH (ref 70–99)

## 2023-02-14 MED ORDER — SODIUM CHLORIDE 0.9 % IV BOLUS
1000.0000 mL | Freq: Once | INTRAVENOUS | Status: AC
  Administered 2023-02-14: 1000 mL via INTRAVENOUS

## 2023-02-14 NOTE — Telephone Encounter (Signed)
  Chief Complaint: neuro problem Symptoms: period of HA, feeling like tight band around head, head being empty and weakness and unsteady Frequency: today approx 1.5 hr ago Pertinent Negatives: Patient denies current sx present  Disposition: [x] ED /[] Urgent Care (no appt availability in office) / [] Appointment(In office/virtual)/ []  [place] [facility]/ [] Home Care/ [] Refused Recommended Disposition /[]  Mobile Bus/ []  Follow-up with PCP Additional Notes: spoke with William Keith's wife. She states William Keith was up walking to kitchen had brief moment where had HA, feeling blank and became weak and unsteady. She was with William Keith and William Keith had rollator so didn't have any fall. William Keith has ate lunch and feels ok now however she is concerned he may have had TIA. William Keith is on Eliquis  BID for Afib and CVA. William Keith is seen by neurology and cardiology. Advised of ED or can call Cardiology for their input but if TIA or CVA William Keith would need to be seen in ED for evaluation. Advised Mrs. Sangha that if CVA or TIA recommended getting to ED within 4 hours in case medical intervention can be done. She states they will go ahead and go to ED.   Reason for Disposition  [1] Weakness (i.e., paralysis, loss of muscle strength) of the face, arm / hand, or leg / foot on one side of the body AND [2] sudden onset AND [3] brief (now gone)  Answer Assessment - Initial Assessment Questions 1. SYMPTOM: "What is the main symptom you are concerned about?" (e.g., weakness, numbness)     Had HA tight band around  2. ONSET: "When did this start?" (minutes, hours, days; while sleeping)     Today about 1.5 hrs ago  3. LAST NORMAL: "When was the last time you (the patient) were normal (no symptoms)?"     This morning and normal now  4. PATTERN "Does this come and go, or has it been constant since it started?"  "Is it present now?"     Intermittent  5. CARDIAC SYMPTOMS: "Have you had any of the following symptoms: chest pain, difficulty breathing,  palpitations?"     no 6. NEUROLOGIC SYMPTOMS: "Have you had any of the following symptoms: headache, dizziness, vision loss, double vision, changes in speech, unsteady on your feet?"     Had moment of unsteadiness  7. OTHER SYMPTOMS: "Do you have any other symptoms?"  Protocols used: Neurologic Deficit-A-AH

## 2023-02-14 NOTE — ED Triage Notes (Signed)
 Pt in via POV from home, reports around 1200 today getting this feeling of "a ring around my head" w/ generalized weakness and a near syncopal episode.  Incident lasted only seconds, does complain of a headache at this time, but is otherwise asymptomatic.   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  Patient is neurologically intact at this time.

## 2023-02-14 NOTE — ED Notes (Signed)
 See triage notes. Patient stated that around lunch time he became weak while walking to the kitchen and that his head, "felt empty." Patient was concerned that he had suffered a stroke or TIA. Patient denies any deficits, slurred speech or facial drooping at the time of the incident. Patient c/o headache and states that at the time of the initial incident it "felt like there was a band around his head"

## 2023-02-14 NOTE — ED Provider Notes (Signed)
 [facility] Provider Note    Event Date/Time   First MD Initiated Contact with Patient 02/14/23 1717     (approximate)  History   Chief Complaint: Weakness and Near Syncope  HPI  Lapatrick Dimitroff is a 83 y.o. male with a past medical history of atrial fibrillation, diabetes, hypertension, hyperlipidemia, Parkinson's, presents to the emergency department for near syncope.  According to the patient he got up from his chair and was walking to the kitchen when he became very lightheaded dizzy felt like he was going to pass out.  Did not fully pass out but came to the emergency department for evaluation.  Patient states he felt very lightheaded like a band around his head which then slowly resolved.  Patient states he will often get lightheaded when he first stands out of the chair but states he was able to walk nearly to the dining room this time before symptoms occurred.  Patient denies any chest pain or shortness of breath.  Denies any recent illnesses such as nausea vomiting diarrhea cough congestion or fever.  Patient states he is feeling much better.  Denies any unilateral weakness or numbness at any point.  Physical Exam   Triage Vital Signs: ED Triage Vitals  Encounter Vitals Group     BP 02/14/23 1334 110/65     Systolic BP Percentile --      Diastolic BP Percentile --      Pulse Rate 02/14/23 1334 (!) 108     Resp 02/14/23 1334 20     Temp 02/14/23 1334 97.8 F (36.6 C)     Temp Source 02/14/23 1334 Oral     SpO2 02/14/23 1334 99 %     Weight 02/14/23 1341 200 lb (90.7 kg)     Height 02/14/23 1341 5\' 8"  (1.727 m)     Head Circumference --      Peak Flow --      Pain Score 02/14/23 1340 5     Pain Loc --      Pain Education --      Exclude from Growth Chart --     Most recent vital signs: Vitals:   02/14/23 1334 02/14/23 1746  BP: 110/65 (!) 141/63  Pulse: (!) 108 96  Resp: 20 20  Temp: 97.8 F (36.6 C) 97.7 F (36.5 C)  SpO2: 99% 100%     General: Awake, no distress.  CV:  Good peripheral perfusion.  Regular rate and rhythm  Resp:  Normal effort.  Equal breath sounds bilaterally.  Abd:  No distention.  Soft, nontender.  No rebound or guarding.  ED Results / Procedures / Treatments   EKG  EKG viewed and interpreted by myself shows sinus tachycardia 106 bpm with a narrow QRS, normal axis, normal intervals, nonspecific ST changes.  RADIOLOGY  I have reviewed interpret the CT head images.  No bleed seen on my evaluation. Radiology is read the CT scan is negative for acute infarct but there are numerous smaller prior infarcts.   MEDICATIONS ORDERED IN ED: Medications  sodium chloride  0.9 % bolus 1,000 mL (1,000 mLs Intravenous New Bag/Given 02/14/23 1746)     IMPRESSION / MDM / ASSESSMENT AND PLAN / ED COURSE  I reviewed the triage vital signs and the nursing notes.  Patient's presentation is most consistent with acute presentation with potential threat to life or bodily function.  Patient presents the emergency department for near syncopal episode.  Patient states a history of lightheadedness/dizziness upon  standing.  Patient states he stood today and walked to the dining room and became very lightheaded like he might pass out.  Patient denies any chest pain or any shortness of breath at any point.  No pleuritic pain.  Patient's workup is so far reassuring with a normal CBC, reassuring chemistry.  EKG shows mild tachycardia otherwise reassuring.  CT scan head shows no acute finding.  We will check a troponin given the patient's near syncope.  We will IV hydrate given likely orthostatic syncope/near syncope.  Will also obtain a COVID/flu test as a precaution.  Patient is agreeable to this plan of care.  He states he is feeling much better and is asking when they will be able to go home.  Patient's troponin is negative.  Respiratory panel is pending.  Patient and wife are requesting to be discharged home.  They understand  that all the results have not yet resulted but still wished to go home as they have been here now 5 hours.  We will discharge per their request.  Patient to follow-up with his doctor.  FINAL CLINICAL IMPRESSION(S) / ED DIAGNOSES   Near syncope   Note:  This document was prepared using Dragon voice recognition software and may include unintentional dictation errors.   Ruth Cove, MD 02/14/23 Quin Brush

## 2023-02-14 NOTE — Discharge Instructions (Addendum)
 Please drink plenty of fluids and follow-up with your doctor tomorrow or the following day.  Return to the emergency department for any further significant symptoms or any further lightheadedness dizziness, chest pain shortness of breath or any other symptom personally concerning to yourself.

## 2023-02-18 ENCOUNTER — Other Ambulatory Visit: Payer: Self-pay

## 2023-02-18 ENCOUNTER — Emergency Department: Payer: Medicare Other

## 2023-02-18 ENCOUNTER — Encounter: Payer: Self-pay | Admitting: Emergency Medicine

## 2023-02-18 ENCOUNTER — Emergency Department
Admission: EM | Admit: 2023-02-18 | Discharge: 2023-02-18 | Disposition: A | Discharge status: Home / Self Care | Payer: Medicare Other | Attending: Emergency Medicine | Admitting: Emergency Medicine

## 2023-02-18 DIAGNOSIS — W1839XA Other fall on same level, initial encounter: Secondary | ICD-10-CM | POA: Diagnosis not present

## 2023-02-18 DIAGNOSIS — I10 Essential (primary) hypertension: Secondary | ICD-10-CM | POA: Diagnosis not present

## 2023-02-18 DIAGNOSIS — E119 Type 2 diabetes mellitus without complications: Secondary | ICD-10-CM | POA: Insufficient documentation

## 2023-02-18 DIAGNOSIS — S0101XA Laceration without foreign body of scalp, initial encounter: Secondary | ICD-10-CM | POA: Diagnosis not present

## 2023-02-18 DIAGNOSIS — R55 Syncope and collapse: Secondary | ICD-10-CM | POA: Insufficient documentation

## 2023-02-18 DIAGNOSIS — Y92002 Bathroom of unspecified non-institutional (private) residence single-family (private) house as the place of occurrence of the external cause: Secondary | ICD-10-CM | POA: Insufficient documentation

## 2023-02-18 DIAGNOSIS — I4891 Unspecified atrial fibrillation: Secondary | ICD-10-CM | POA: Diagnosis not present

## 2023-02-18 DIAGNOSIS — Z7901 Long term (current) use of anticoagulants: Secondary | ICD-10-CM | POA: Diagnosis not present

## 2023-02-18 DIAGNOSIS — S0990XA Unspecified injury of head, initial encounter: Secondary | ICD-10-CM

## 2023-02-18 DIAGNOSIS — Z8673 Personal history of transient ischemic attack (TIA), and cerebral infarction without residual deficits: Secondary | ICD-10-CM | POA: Diagnosis not present

## 2023-02-18 LAB — CBC
HCT: 44.9 % (ref 39.0–52.0)
Hemoglobin: 14.5 g/dL (ref 13.0–17.0)
MCH: 29 pg (ref 26.0–34.0)
MCHC: 32.3 g/dL (ref 30.0–36.0)
MCV: 89.8 fL (ref 80.0–100.0)
Platelets: 223 10*3/uL (ref 150–400)
RBC: 5 MIL/uL (ref 4.22–5.81)
RDW: 13.6 % (ref 11.5–15.5)
WBC: 10.7 10*3/uL — ABNORMAL HIGH (ref 4.0–10.5)
nRBC: 0 % (ref 0.0–0.2)

## 2023-02-18 LAB — BASIC METABOLIC PANEL
Anion gap: 10 (ref 5–15)
BUN: 9 mg/dL (ref 8–23)
CO2: 24 mmol/L (ref 22–32)
Calcium: 9.9 mg/dL (ref 8.9–10.3)
Chloride: 104 mmol/L (ref 98–111)
Creatinine, Ser: 0.74 mg/dL (ref 0.61–1.24)
GFR, Estimated: >60 mL/min (ref >60)
Glucose, Bld: 110 mg/dL — ABNORMAL HIGH (ref 70–99)
Potassium: 4.2 mmol/L (ref 3.5–5.1)
Sodium: 138 mmol/L (ref 135–145)

## 2023-02-18 MED ORDER — SODIUM CHLORIDE 0.9 % IV BOLUS
500.0000 mL | Freq: Once | INTRAVENOUS | Status: AC
  Administered 2023-02-18: 500 mL via INTRAVENOUS

## 2023-02-18 MED ORDER — CEPHALEXIN 500 MG PO CAPS: 500.0000 mg | ORAL_CAPSULE | Freq: Three times a day (TID) | ORAL | 0 refills | Status: AC

## 2023-02-18 MED ORDER — AMOXICILLIN-POT CLAVULANATE 875-125 MG PO TABS: 1.0000 | ORAL_TABLET | Freq: Two times a day (BID) | ORAL | 0 refills | Status: DC

## 2023-02-18 MED ORDER — LIDOCAINE-EPINEPHRINE (PF) 2 %-1:200000 IJ SOLN
20.0000 mL | Freq: Once | INTRAMUSCULAR | Status: AC
  Administered 2023-02-18: 20 mL via INTRADERMAL
  Filled 2023-02-18: qty 20

## 2023-02-18 MED ORDER — AZITHROMYCIN 250 MG PO TABS: ORAL_TABLET | ORAL | 0 refills | Status: AC

## 2023-02-18 NOTE — ED Notes (Signed)
See triage note  Presents with family s/p syncopal episode Per wife he had a syncopal episode this am in the bathroom  Hitting his head  Pos LOC  Laceration noted to forehead

## 2023-02-18 NOTE — ED Triage Notes (Signed)
Arrives from home via [facility].  Seen Monday through ED, diagnosed with near syncope.  This morning in the bathroom and was changing clothes and patient passed out.  Hit bathroom floor.  Patient with 2 in laceration to forehead.  Takes Eliquis, Hx afib.   138/86 100-110 P -- Sinus Tach.

## 2023-02-18 NOTE — ED Provider Notes (Signed)
[facility] Provider Note    Event Date/Time   First MD Initiated Contact with Patient 02/18/23 [phone]     (approximate)   History   Loss of Consciousness   HPI  William Keith is a 83 y.o. male with a history of atrial fibrillation, diabetes, hypertension, Parkinson's, stroke on Eliquis who presents after a fall.  Patient has been having episodes where he becomes lightheaded for some time, this seemed to worsen today and he fell forward and hit his head.  Denies lower extremity or upper extremity injuries, no chest pain, no abdominal pain, no back pain     Physical Exam   Triage Vital Signs: ED Triage Vitals  Encounter Vitals Group     BP 02/18/23 0949 137/81     Systolic BP Percentile --      Diastolic BP Percentile --      Pulse Rate 02/18/23 0949 (!) 114     Resp 02/18/23 0949 17     Temp 02/18/23 0949 98.6 F (37 C)     Temp Source 02/18/23 0949 Oral     SpO2 02/18/23 0949 98 %     Weight 02/18/23 0925 90.7 kg (199 lb 15.3 oz)     Height 02/18/23 1056 1.727 m (5\' 8" )     Head Circumference --      Peak Flow --      Pain Score 02/18/23 0921 9     Pain Loc --      Pain Education --      Exclude from Growth Chart --     Most recent vital signs: Vitals:   02/18/23 0949  BP: 137/81  Pulse: (!) 114  Resp: 17  Temp: 98.6 F (37 C)  SpO2: 98%     General: Awake, no distress.  CV:  Good peripheral perfusion.  Resp:  Normal effort.  Abd:  No distention.  Other:  4.5 cm laceration to the right forehead/scalp  No pain with axial load on both hips, normal range of motion of the lower and upper extremities without pain.  No chest wall tenderness palpation, no abdominal tenderness palpation, no vertebral tenderness to palpation   ED Results / Procedures / Treatments   Labs (all labs ordered are listed, but only abnormal results are displayed) Labs Reviewed  BASIC METABOLIC PANEL - Abnormal; Notable for the following components:       Result Value   Glucose, Bld 110 (*)    All other components within normal limits  CBC - Abnormal; Notable for the following components:   WBC 10.7 (*)    All other components within normal limits  URINALYSIS, ROUTINE W REFLEX MICROSCOPIC  CBG MONITORING, ED     EKG  ED ECG REPORT I, Jene Every, the attending physician, personally viewed and interpreted this ECG.  Date: 02/18/2023  Rhythm: Sinus tachycardia QRS Axis: normal Intervals: Normal ST/T Wave abnormalities: normal Narrative Interpretation: no evidence of acute ischemia    RADIOLOGY CT head viewed interpret by me, no evidence of ICH, confirm by radiology    PROCEDURES:yes    Critical Care performed:   .Laceration Repair  Date/Time: 02/18/2023 12:20 PM  Performed by: Jene Every, MD Authorized by: Jene Every, MD   Consent:    Consent obtained:  Verbal Laceration details:    Location:  Scalp   Scalp location:  Frontal   Length (cm):  4.5 Treatment:    Area cleansed with:  Chlorhexidine   Amount of cleaning:  Standard Skin repair:    Repair method:  Sutures   Suture size:  6-0   Suture material:  Nylon   Suture technique:  Simple interrupted   Number of sutures:  6 Approximation:    Approximation:  Close Repair type:    Repair type:  Simple Post-procedure details:    Dressing:  Bulky dressing   Procedure completion:  Tolerated well, no immediate complications    MEDICATIONS ORDERED IN ED: Medications  sodium chloride 0.9 % bolus 500 mL (0 mLs Intravenous Stopped 02/18/23 1202)  lidocaine-EPINEPHrine (XYLOCAINE W/EPI) 2 %-1:200000 (PF) injection 20 mL (20 mLs Intradermal Given 02/18/23 1052)     IMPRESSION / MDM / ASSESSMENT AND PLAN / ED COURSE  I reviewed the triage vital signs and the nursing notes. Patient's presentation is most consistent with acute presentation with potential threat to life or bodily function.  Patient with head injury on Eliquis secondary to syncopal  episode.  Differential includes ICH, concussion, head injury, electrolyte abnormality, arrhythmia etc.  Overall well-appearing and has been struggling with orthostatics per his wife  Laceration repaired by me  CT head and cervical spine negative for acute injury.  Discussed holding Eliquis with wife and recommended confirming this plan with PCP  Lab work is overall reassuring and not significantly changed from prior.  Offered admission for further evaluation given initial tachycardia and second episode of dizziness however patient declined, he has a Valentine's Day dinner tonight would like to go home  Suture removal in 5 to 7 days      FINAL CLINICAL IMPRESSION(S) / ED DIAGNOSES   Final diagnoses:  Syncope and collapse  Injury of head, initial encounter  Scalp laceration, initial encounter     Rx / DC Orders   ED Discharge Orders     None        Note:  This document was prepared using Dragon voice recognition software and may include unintentional dictation errors.   Jene Every, MD 02/18/23 1220

## 2023-03-05 IMAGING — CT CT HEAD W/O CM
3 series · 15 of 47 positions shown, 18 images · non-contrast
Comparison: 02/26/2020

CLINICAL DATA: Fell 1 hour ago, hit head, headache

EXAM:
CT HEAD WITHOUT CONTRAST
CT CERVICAL SPINE WITHOUT CONTRAST
TECHNIQUE: Multidetector CT imaging of the head and cervical spine was
performed following the standard protocol without intravenous
contrast. Multiplanar CT image reconstructions of the cervical spine
were also generated.

[Series 3: head wo · axial · 0.41mm/px · z∈[-129,+1]mm · 9 of 32 slices shown, 12 images]
[im 3/32  brain]
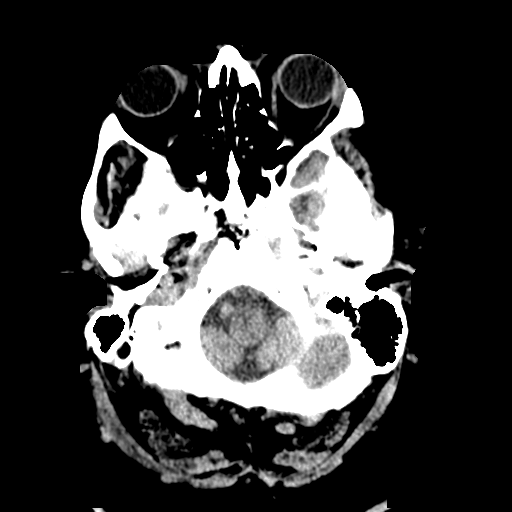
[im 3/32  bone]
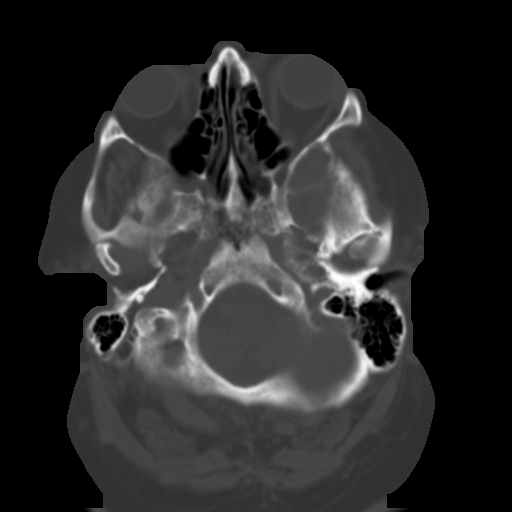
[im 6/32  brain]
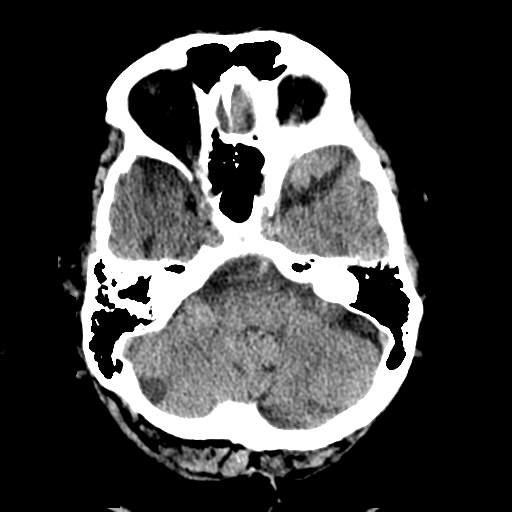
[im 9/32  brain]
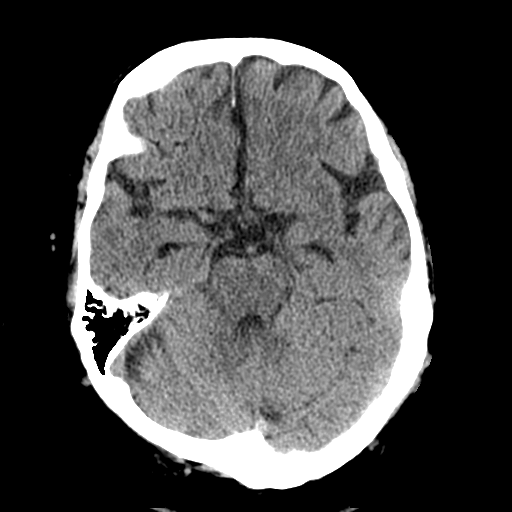
[im 12/32  brain]
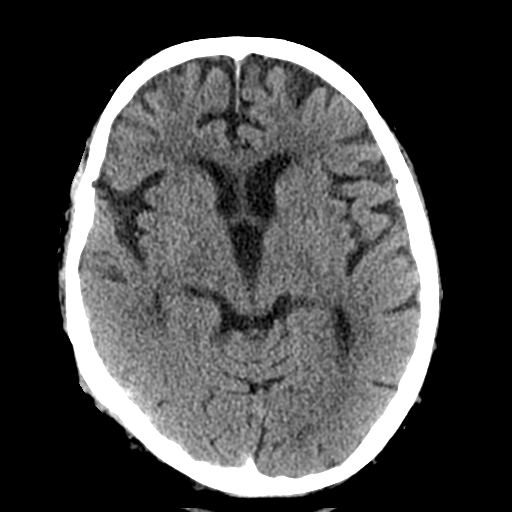
[im 17/32  brain]
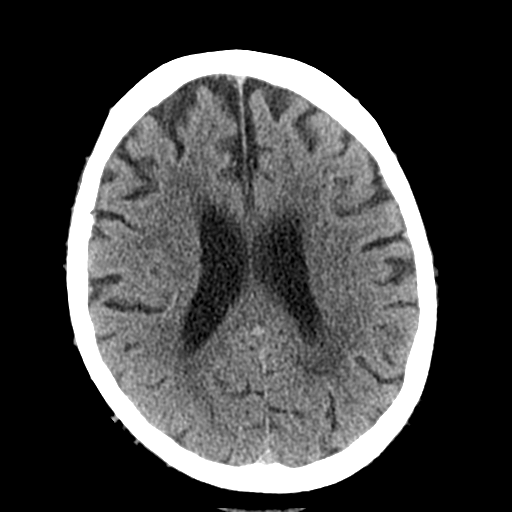
[im 17/32  bone]
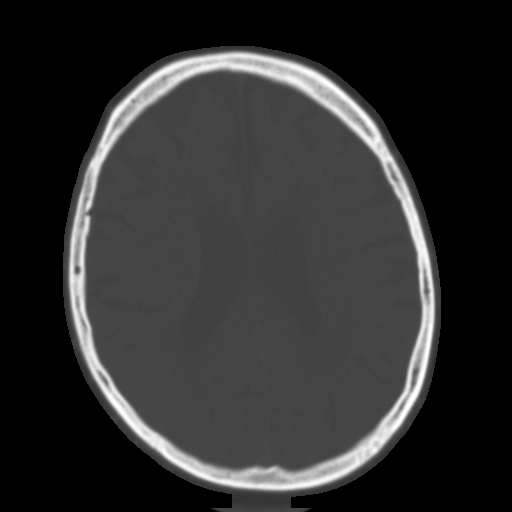
[im 20/32  brain]
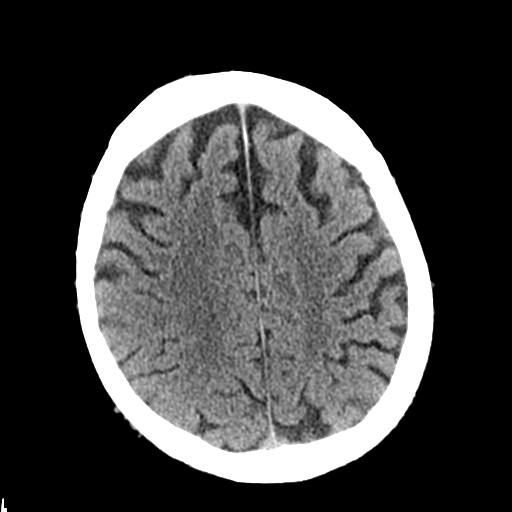
[im 23/32  brain]
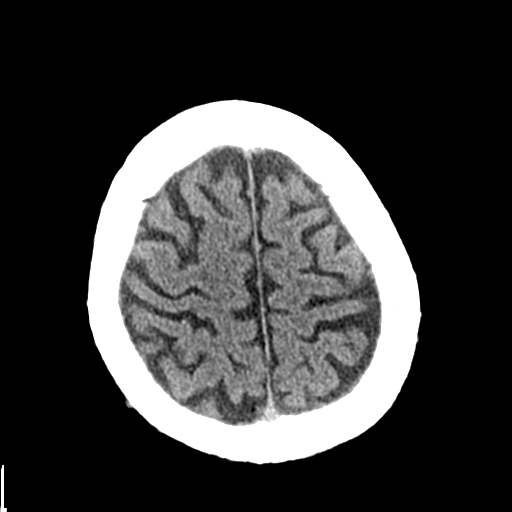
[im 26/32  brain]
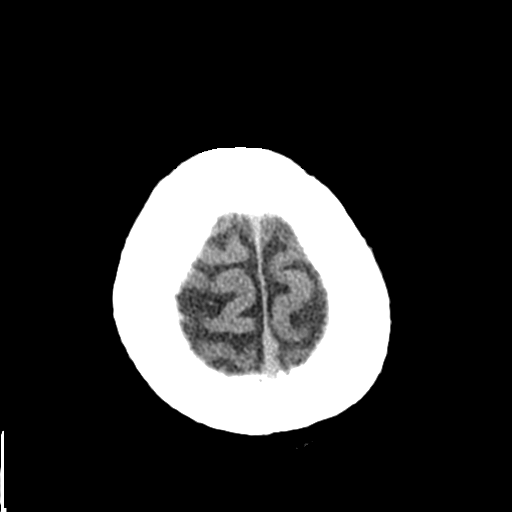
[im 29/32  brain]
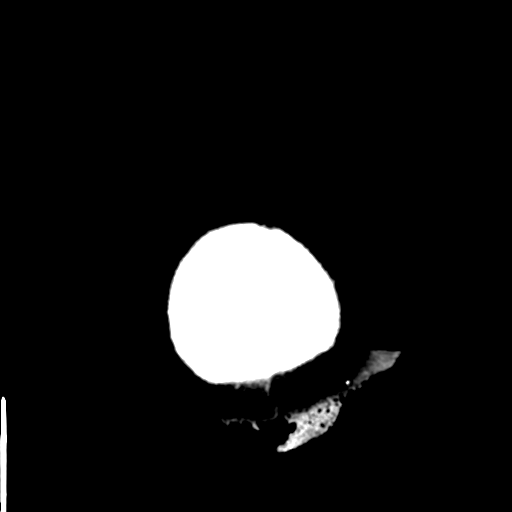
[im 29/32  bone]
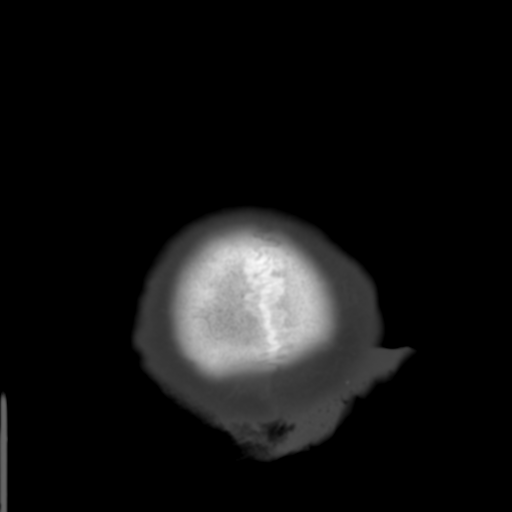

[Series 4: coronal soft tissue · coronal · 0.34mm/px · 3 of 70 slices shown]
[im 24/70  brain]
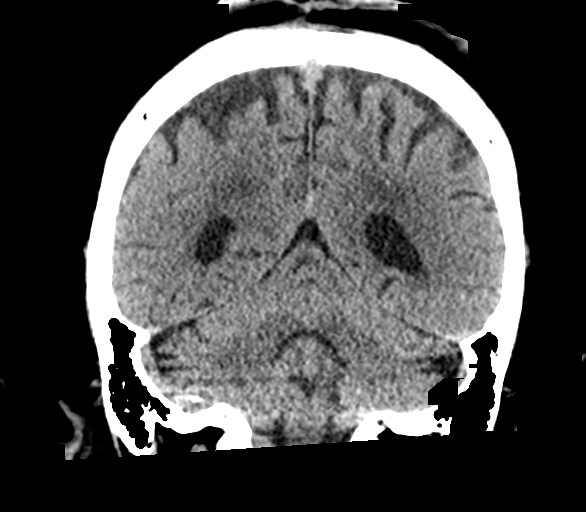
[im 31/70  brain]
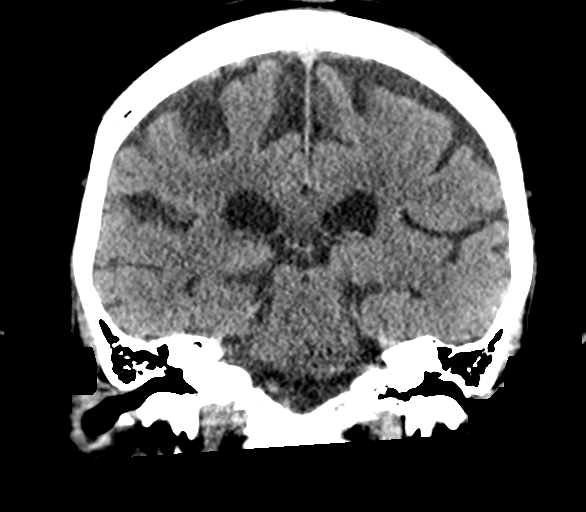
[im 39/70  brain]
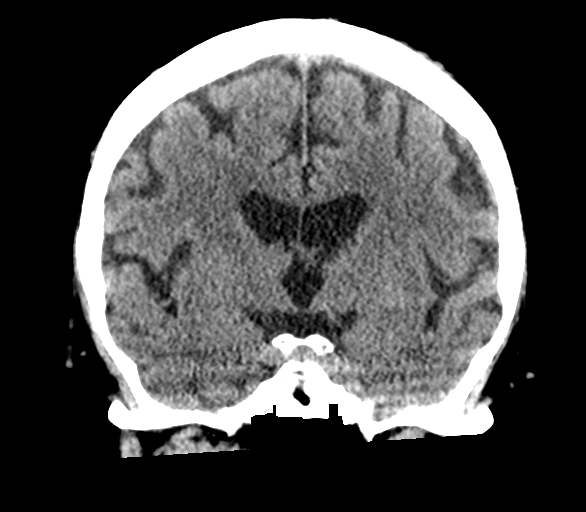

[Series 5: sagittal soft tissue · sagittal · 0.34mm/px · 3 of 66 slices shown]
[im 22/66  brain]
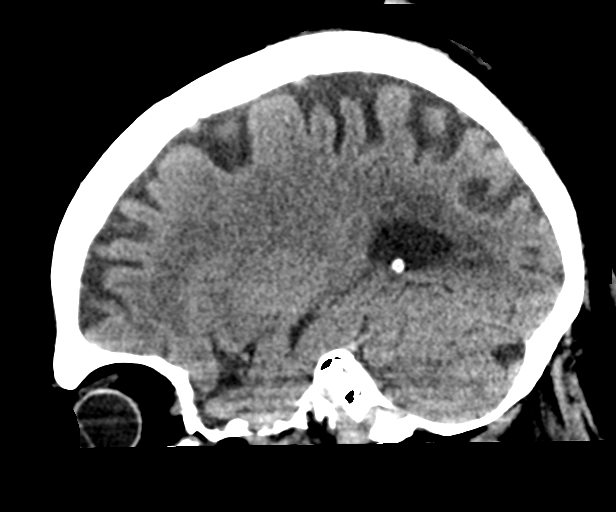
[im 33/66  brain]
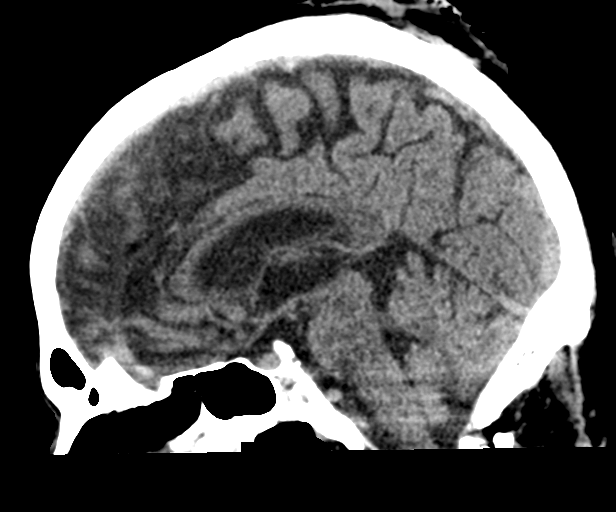
[im 44/66  brain]
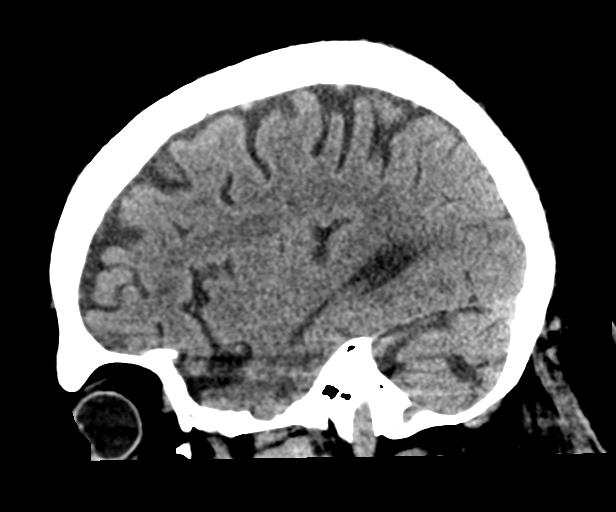

[15 of 47 positions shown; findings below may reference images not displayed]

FINDINGS: CT HEAD FINDINGS

Brain: No acute infarct or hemorrhage. Stable hypodensities in the
periventricular white matter consistent with chronic small vessel
ischemic changes. Stable chronic infarcts in the bilateral
cerebellar hemispheres. Lateral ventricles and midline structures
are unremarkable. No acute extra-axial fluid collections. No mass
effect.

Vascular: No hyperdense vessel or unexpected calcification.

Skull: Left parietal scalp laceration. No underlying fracture. The
remainder of the calvarium is unremarkable.

Sinuses/Orbits: No acute finding.

Other: None.

CT CERVICAL SPINE FINDINGS

Alignment: Extensive postsurgical changes are seen throughout the
cervical spine with multilevel fusion, with resulting mild kyphosis
at the cervicothoracic junction. Otherwise alignment is anatomic.

Skull base and vertebrae: No acute fracture. No primary bone lesion
or focal pathologic process.

Soft tissues and spinal canal: No prevertebral fluid or swelling. No
visible canal hematoma.

Disc levels: Prior posterior fusion spanning C2 through C6. Anterior
fusion plate and intra corporal screws spanning C3 through C5. There
is bony fusion at C5-6 and C6-7 as well. Mild spondylosis at C2-3.

Upper chest: Airway is patent.  Lung apices are clear.

Other: Reconstructed images demonstrate no additional findings.
IMPRESSION: 1. Left parietal scalp laceration. Otherwise no acute intracranial
process.
2. No acute cervical spine fracture.

## 2023-03-09 ENCOUNTER — Other Ambulatory Visit: Payer: Self-pay | Admitting: Urology

## 2023-03-15 IMAGING — DX DG CHEST 1V PORT
1 series · 1 of 1 positions shown · non-contrast
Comparison: Portable exam 6839 hours compared to 12/13/2019

CLINICAL DATA: Hypotension and dizziness starting today

EXAM:
PORTABLE CHEST 1 VIEW

[chest ap]
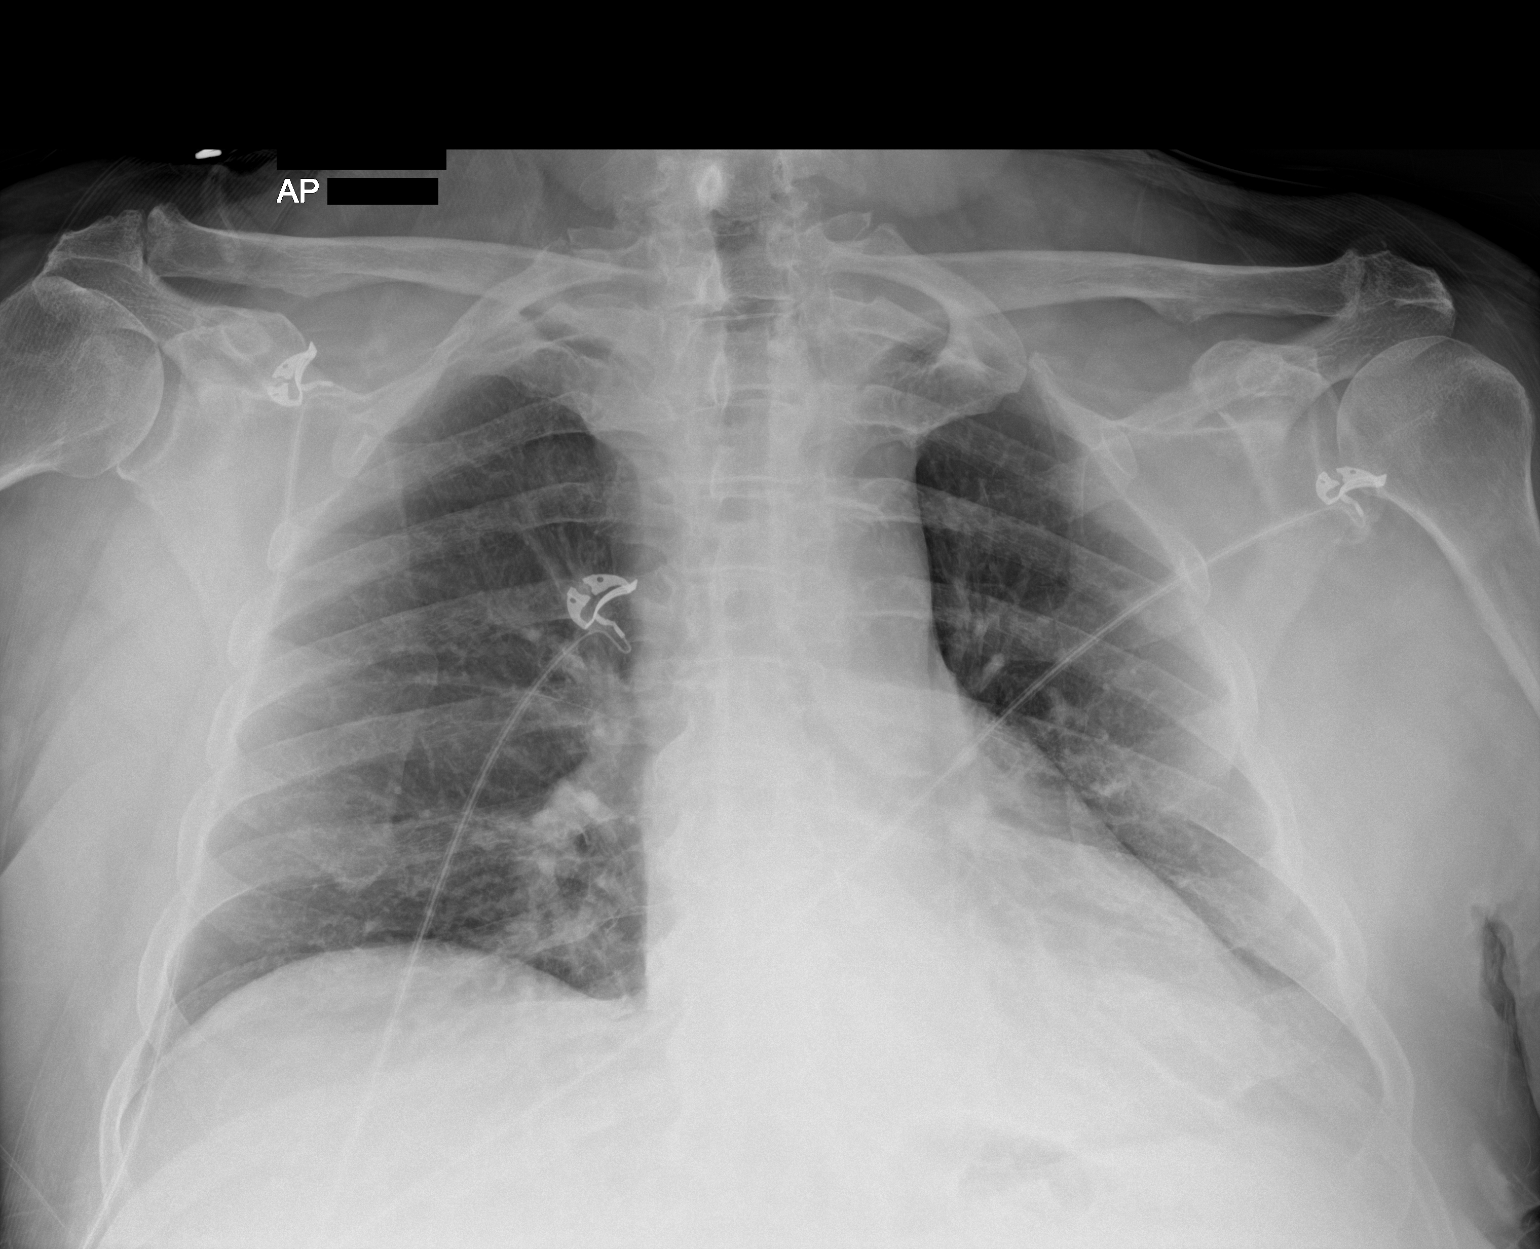

[1 of 1 positions shown; findings below may reference images not displayed]

FINDINGS: Normal heart size, mediastinal contours, and pulmonary vascularity.

Increased opacity at medial LEFT lung base question atelectasis
versus consolidation.

Remaining lungs clear.

No pleural effusion or pneumothorax.

Prior cervical spine fusion.
IMPRESSION: Question atelectasis versus consolidation LEFT lower lobe.

## 2023-04-04 ENCOUNTER — Emergency Department

## 2023-04-04 ENCOUNTER — Other Ambulatory Visit: Payer: Self-pay

## 2023-04-04 ENCOUNTER — Emergency Department
Admission: EM | Admit: 2023-04-04 | Discharge: 2023-04-05 | Disposition: A | Discharge status: Home / Self Care | Attending: Emergency Medicine | Admitting: Emergency Medicine

## 2023-04-04 DIAGNOSIS — M25551 Pain in right hip: Secondary | ICD-10-CM | POA: Insufficient documentation

## 2023-04-04 DIAGNOSIS — M25511 Pain in right shoulder: Secondary | ICD-10-CM | POA: Insufficient documentation

## 2023-04-04 DIAGNOSIS — W1809XA Striking against other object with subsequent fall, initial encounter: Secondary | ICD-10-CM | POA: Diagnosis not present

## 2023-04-04 DIAGNOSIS — Z7901 Long term (current) use of anticoagulants: Secondary | ICD-10-CM | POA: Diagnosis not present

## 2023-04-04 DIAGNOSIS — M25512 Pain in left shoulder: Secondary | ICD-10-CM | POA: Insufficient documentation

## 2023-04-04 DIAGNOSIS — G20C Parkinsonism, unspecified: Secondary | ICD-10-CM | POA: Insufficient documentation

## 2023-04-04 DIAGNOSIS — R52 Pain, unspecified: Secondary | ICD-10-CM

## 2023-04-04 DIAGNOSIS — W19XXXA Unspecified fall, initial encounter: Secondary | ICD-10-CM

## 2023-04-04 DIAGNOSIS — M542 Cervicalgia: Secondary | ICD-10-CM | POA: Insufficient documentation

## 2023-04-04 MED ORDER — ONDANSETRON HCL 4 MG/2ML IJ SOLN
4.0000 mg | Freq: Once | INTRAMUSCULAR | Status: AC
  Administered 2023-04-05: 4 mg via INTRAVENOUS
  Filled 2023-04-04: qty 2

## 2023-04-04 MED ORDER — MORPHINE SULFATE (PF) 4 MG/ML IV SOLN
4.0000 mg | Freq: Once | INTRAVENOUS | Status: AC
  Administered 2023-04-04: 4 mg via INTRAVENOUS
  Filled 2023-04-04: qty 1

## 2023-04-04 MED ORDER — DIAZEPAM 5 MG/ML IJ SOLN
2.5000 mg | Freq: Once | INTRAMUSCULAR | Status: AC
  Administered 2023-04-04: 2.5 mg via INTRAVENOUS
  Filled 2023-04-04: qty 2

## 2023-04-04 MED ORDER — LIDOCAINE 5 % EX PTCH
1.0000 | MEDICATED_PATCH | Freq: Once | CUTANEOUS | Status: AC
  Administered 2023-04-04: 1 via TRANSDERMAL
  Filled 2023-04-04: qty 1

## 2023-04-04 MED ORDER — ACETAMINOPHEN 500 MG PO TABS
1000.0000 mg | ORAL_TABLET | Freq: Once | ORAL | Status: AC
  Administered 2023-04-04: 1000 mg via ORAL
  Filled 2023-04-04: qty 2

## 2023-04-04 NOTE — ED Provider Notes (Signed)
 [facility] Provider Note    Event Date/Time   First MD Initiated Contact with Patient 04/04/23 2012     (approximate)   History   Fall   HPI Sender Rueb is a 83 y.o. male presenting today for ground-level fall.  Patient states he has a history of Parkinson's which caused him to fall a lot.  Today he had an additional fall where he fell down hitting the right side of his body.  He did report hitting the back of his head and neck with loss of consciousness.  He is on Eliquis.  Also noted pain to his bilateral shoulders and right hip.  He originally went to [facility] where he had x-rays of his bilateral shoulders and C-spine which showed no evidence of any fractures.  Presented here for further evaluation regarding head injury and ongoing pain symptoms.  Wife gave him 2 Percocets without any significant improvement in symptoms.  On arrival here, he is mostly complaining of pain throughout his neck at this time.  Denies any numbness or tingling anywhere.  No obvious weakness to any extremities.     Physical Exam   Triage Vital Signs: ED Triage Vitals  Encounter Vitals Group     BP 04/04/23 1835 105/70     Systolic BP Percentile --      Diastolic BP Percentile --      Pulse Rate 04/04/23 1835 88     Resp 04/04/23 1835 18     Temp 04/04/23 1835 98 F (36.7 C)     Temp src --      SpO2 04/04/23 1835 100 %     Weight --      Height --      Head Circumference --      Peak Flow --      Pain Score 04/04/23 1833 10     Pain Loc --      Pain Education --      Exclude from Growth Chart --     Most recent vital signs: Vitals:   04/04/23 2100 04/04/23 2130  BP: (!) 151/71 (!) 150/69  Pulse: 81 80  Resp:    Temp:    SpO2: 98% 97%   Physical Exam: I have reviewed the vital signs and nursing notes. General: Awake, alert, no acute distress.  Nontoxic appearing. Head:  Atraumatic, normocephalic.   ENT:  EOM intact, PERRL. Oral mucosa is pink and  moist with no lesions. Neck: Tender to palpation throughout the entire neck including over the cervical spine and lateral neck Cardiovascular:  RRR, No murmurs. Peripheral pulses palpable and equal bilaterally. Respiratory:  Symmetrical chest wall expansion.  No rhonchi, rales, or wheezes.  Good air movement throughout.  No use of accessory muscles.   Musculoskeletal:  No cyanosis or edema. Moving extremities with full ROM.  Mild tenderness over bilateral anterior shoulders but no significant pain with range of motion.  Otherwise nontender throughout bilateral upper and lower extremities.  Strength 5 out of 5 and intact throughout. Abdomen:  Soft, nontender, nondistended. Neuro:  GCS 15, moving all four extremities, interacting appropriately. Speech clear.  Sensation equal and intact throughout bilateral upper and lower extremities. Psych:  Calm, appropriate.   Skin:  Warm, dry, no rash.    ED Results / Procedures / Treatments   Labs (all labs ordered are listed, but only abnormal results are displayed) Labs Reviewed  CBC WITH DIFFERENTIAL/PLATELET  BASIC METABOLIC PANEL WITH GFR  URINALYSIS, ROUTINE W  REFLEX MICROSCOPIC     EKG    RADIOLOGY Independently interpreted CT head and C-spine with no acute traumatic pathologies.  Independently interpreted right hip x-ray with no acute pathology.   PROCEDURES:  Critical Care performed: No  Procedures   MEDICATIONS ORDERED IN ED: Medications  lidocaine (LIDODERM) 5 % 1 patch (1 patch Transdermal Patch Applied 04/04/23 2126)  ondansetron (ZOFRAN) injection 4 mg (0 mg Intravenous Hold 04/04/23 2132)  morphine (PF) 4 MG/ML injection 4 mg (4 mg Intravenous Given 04/04/23 2117)  acetaminophen (TYLENOL) tablet 1,000 mg (1,000 mg Oral Given 04/04/23 2121)  diazepam (VALIUM) injection 2.5 mg (2.5 mg Intravenous Given 04/04/23 2228)     IMPRESSION / MDM / ASSESSMENT AND PLAN / ED COURSE  I reviewed the triage vital signs and the nursing  notes.                              Differential diagnosis includes, but is not limited to, ICH, cervical spine injury,, nerve impingement, soft tissue hematoma  Patient's presentation is most consistent with acute complicated illness / injury requiring diagnostic workup.  Patient is an 83 year old male presenting today for ground-level fall with head and neck injury.  Already had imaging of bilateral shoulders at [facility] which were negative for fracture.  CT imaging of the head and neck ordered here to rule out further acute pathology.  Also had right hip x-ray ordered in triage although he denies any pain to his right hip at this time.  CT imaging of head and neck were completely unremarkable.  X-ray of right hip was negative as well.  Patient with severe pain throughout the neck but no obvious other acute neurological symptoms present.  Will attempt for pain control first but if unable to tolerate this may need follow-up MRI to rule out other acute nerve or soft tissue pathology.  Patient still having severe pain after diazepam.  Will sign out to oncoming provider pending results of MRI and pain control.  Even if MRI is negative, patient might have to severe pain to be able to go home and might require admission.  The patient is on the cardiac monitor to evaluate for evidence of arrhythmia and/or significant heart rate changes. Clinical Course as of 04/04/23 2315  Mon Apr 04, 2023  2056 XR with orthopedics for C-spine, left shoulder, right shoulder all negative [DW]  2200 Patient still having ongoing severe neck pain.  Feels sharp pain shooting from his neck.  Will get MRI to evaluate for any acute nerve impingement or ligamentous injury [DW]    Clinical Course User Index [DW] Janith Lima, MD     FINAL CLINICAL IMPRESSION(S) / ED DIAGNOSES   Final diagnoses:  Fall, initial encounter  Neck pain     Rx / DC Orders   ED Discharge Orders     None        Note:  This  document was prepared using Dragon voice recognition software and may include unintentional dictation errors.   Janith Lima, MD 04/04/23 [phone]

## 2023-04-04 NOTE — ED Provider Notes (Signed)
 11:00 PM  Assumed care at shift change.  Patient with parkinson's with ground level fall.  Having intractable neck pain.  MRI c spine, labs, urine pending.  Likely will need admit for pain control, rehab.  12:00 AM  Pt's MRI shows no acute abnormality when reviewed and interpreted by myself and the radiologist.  Labs, urine pending.  2:00 AM  Pt's labs, urine unremarkable.  Resting comfortably but still complains of significant pain with any movement and trying to walk.  Wife at bedside states they live at [facility] independent living but have the option to go to skilled nursing, rehab if needed.  She states that she does not feel that the patient is safe to come home with her tonight.  We discussed options of admission for pain control, physical therapy versus seeing case management in the morning.  They would prefer admission if possible.  Will discuss with the hospitalist.  2:30 AM  Spoke with Dr. Para March with hospitalist service who declines admission at this time.  Discussed this with family and they are comfortable with plan to wait in the emergency department until they can see case management, social work and physical therapy in the morning.  He lives at the [facility] and independent living and has the option to upgrade to rehab/skilled nursing if a bed there is available which is what family would prefer at this time.  Wife is concerned that she would not be able to take care of him at home given his level of pain and unsteadiness on his feet.  Patient states he is still uncomfortable and requesting more medication.  Will give fentanyl, Toradol.   Kirstein Baxley, Layla Maw, DO 04/05/23 [phone]

## 2023-04-04 NOTE — ED Triage Notes (Addendum)
 Pt comes with trip and fall. Pt has parkinson and fell. Pt did hit his head. Pt is on thinner. [facility] and [facility]. Pt sent here for more scans.   Pt did take two pain pills with no relief. Pt states intense pain.

## 2023-04-04 NOTE — ED Provider Triage Note (Signed)
 Emergency Medicine Provider Triage Evaluation Note  William Keith , a 83 y.o. male  was evaluated in triage.  Pt complains of fall, pain in the neck and right shoulder. [facility] seen [facility] and had neck x[facility], was directed to come here. Patient has parkinsons and tripped causing him to fall.  Review of Systems  Positive: Neck pain Negative:   Physical Exam  There were no vitals taken for this visit. Gen:   Awake, no distress   Resp:  Normal effort  MSK:   Moves extremities without difficulty  Other:    Medical Decision Making  Medically screening exam initiated at 6:35 PM.  Appropriate orders placed.  William Keith was informed that the remainder of the evaluation will be completed by another provider, this initial triage assessment does not replace that evaluation, and the importance of remaining in the ED until their evaluation is complete.     William Ali, PA-C 04/04/23 1836

## 2023-04-05 DIAGNOSIS — M542 Cervicalgia: Secondary | ICD-10-CM | POA: Diagnosis not present

## 2023-04-05 LAB — BASIC METABOLIC PANEL WITH GFR
Anion gap: 6 (ref 5–15)
BUN: 13 mg/dL (ref 8–23)
CO2: 23 mmol/L (ref 22–32)
Calcium: 9.1 mg/dL (ref 8.9–10.3)
Chloride: 106 mmol/L (ref 98–111)
Creatinine, Ser: 0.69 mg/dL (ref 0.61–1.24)
GFR, Estimated: >60 mL/min (ref >60)
Glucose, Bld: 133 mg/dL — ABNORMAL HIGH (ref 70–99)
Potassium: 3.9 mmol/L (ref 3.5–5.1)
Sodium: 135 mmol/L (ref 135–145)

## 2023-04-05 LAB — CBC WITH DIFFERENTIAL/PLATELET
Abs Immature Granulocytes: 0.03 10*3/uL (ref 0.00–0.07)
Basophils Absolute: 0.1 10*3/uL (ref 0.0–0.1)
Basophils Relative: 1 %
Eosinophils Absolute: 0.3 10*3/uL (ref 0.0–0.5)
Eosinophils Relative: 2 %
HCT: 41.9 % (ref 39.0–52.0)
Hemoglobin: 13.9 g/dL (ref 13.0–17.0)
Immature Granulocytes: 0 %
Lymphocytes Relative: 25 %
Lymphs Abs: 2.8 10*3/uL (ref 0.7–4.0)
MCH: 29.6 pg (ref 26.0–34.0)
MCHC: 33.2 g/dL (ref 30.0–36.0)
MCV: 89.3 fL (ref 80.0–100.0)
Monocytes Absolute: 1.3 10*3/uL — ABNORMAL HIGH (ref 0.1–1.0)
Monocytes Relative: 12 %
Neutro Abs: 6.8 10*3/uL (ref 1.7–7.7)
Neutrophils Relative %: 60 %
Platelets: 198 10*3/uL (ref 150–400)
RBC: 4.69 MIL/uL (ref 4.22–5.81)
RDW: 13.4 % (ref 11.5–15.5)
WBC: 11.2 10*3/uL — ABNORMAL HIGH (ref 4.0–10.5)
nRBC: 0 % (ref 0.0–0.2)

## 2023-04-05 LAB — URINALYSIS, ROUTINE W REFLEX MICROSCOPIC
Bilirubin Urine: NEGATIVE
Glucose, UA: NEGATIVE mg/dL
Hgb urine dipstick: NEGATIVE
Ketones, ur: NEGATIVE mg/dL
Leukocytes,Ua: NEGATIVE
Nitrite: NEGATIVE
Protein, ur: NEGATIVE mg/dL
Specific Gravity, Urine: 1.011 (ref 1.005–1.030)
pH: 6 (ref 5.0–8.0)

## 2023-04-05 MED ORDER — FENTANYL CITRATE PF 50 MCG/ML IJ SOSY
50.0000 ug | PREFILLED_SYRINGE | Freq: Once | INTRAMUSCULAR | Status: AC
  Administered 2023-04-05: 50 ug via INTRAVENOUS
  Filled 2023-04-05: qty 1

## 2023-04-05 MED ORDER — KETOROLAC TROMETHAMINE 30 MG/ML IJ SOLN
15.0000 mg | Freq: Once | INTRAMUSCULAR | Status: AC
  Administered 2023-04-05: 15 mg via INTRAVENOUS
  Filled 2023-04-05: qty 1

## 2023-04-05 NOTE — ED Notes (Signed)
 PT messaged, wife asking to speak with him about his mobility assessment.

## 2023-04-05 NOTE — Discharge Instructions (Signed)
 Your testing here was fortunately reassuring.  Follow-up with your primary care doctor for further evaluation.  Return to the ER for new or worsening symptoms.

## 2023-04-05 NOTE — ED Notes (Signed)
 EDP messaged to order home PD meds.

## 2023-04-05 NOTE — TOC Initial Note (Signed)
 Transition of Care [facility]) - Initial/Assessment Note    Patient Details  Name: William Keith MRN: [id number] Date of Birth: 05/04/1940  [facility] CM/SW Contact:    Elberta Fortis, RN Phone Number: 04/05/2023, 12:03 PM  Clinical Narrative:                         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   Myofascial pain 06/10/2020   Diet-controlled type 2 diabetes mellitus (HCC) 06/05/2020   Open wound of scalp 06/05/2020   Traumatic skin ulcer with fat layer exposed (HCC) 06/05/2020   Benign essential tremor 05/28/2020   Bleeding from scalp laceration 05/10/2020   Blood loss anemia 05/10/2020   Fall 05/10/2020   Atrial fibrillation (HCC)    Diabetes mellitus without complication (HCC)    Hypercholesteremia    Hypertension    Stroke (HCC)    Neck pain    Dizziness    Depression         Tobacco abuse    Leukocytosis 12/14/2019   Fever 12/14/2019   Headache 12/14/2019   Generalized weakness 12/14/2019   Class 2 severe obesity due to excess calories with serious comorbidity and body mass index (BMI) of 35.0 to 35.9 in adult (HCC) 09/27/2019   Type II diabetes mellitus with complication (HCC) 11/10/2018   Primary osteoarthritis of right knee 10/03/2018   Sepsis (HCC) 09/27/2018   Moderate episode of recurrent major depressive disorder  (HCC) 09/15/2018   Convergence insufficiency 09/01/2018   Postconcussion syndrome 09/01/2018   Saccadic eye movement deficiency 09/01/2018   Autonomic neuropathy 08/02/2018   Orthostatic hypotension 08/02/2018   Aneurysm (HCC) 08/26/2017   S/P ablation of atrial fibrillation 10/23/2016   Long term current use of anticoagulant therapy 07/26/2016   Degenerative joint disease 07/23/2016   DDD (degenerative disc disease), lumbar 02/06/2016   Lumbosacral radiculopathy 02/06/2016   Tremor of both hands 12/19/2015   At high risk for falls 12/18/2015   Ataxia 12/18/2015   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   At risk for sepsis 04/23/2015   Chest pain with high risk for cardiac etiology 04/23/2015   DOE (dyspnea on exertion) 11/15/2014   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) 06/10/2014   PAF (paroxysmal atrial fibrillation) (HCC) 06/10/2014   Frequent falls 06/06/2014   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   Chronic hip pain 08/16/2013   Sensorimotor neuropathy 08/16/2013   Cerebrovascular disease 05/17/2013  Paraparesis of both lower limbs (HCC) 05/08/2013   Cataract 10/26/2012   Thyroid nodule 07/27/2012   Chronic pain syndrome 05/03/2012   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 [place] [address] Phone: [phone] Fax: [phone]     Social Drivers of Health (SDOH) Social History: SDOH Screenings   Food Insecurity: No Food Insecurity (08/01/2022)  Housing: Unknown (02/17/2023)   Received from [facility]  System  Transportation Needs: No Transportation Needs (08/01/2022)  Utilities: Not At Risk (08/01/2022)  Financial Resource Strain: Low Risk  (03/20/2019)   Received from [facility], [facility]  Physical Activity: Inactive (03/20/2019)   Received from [facility], [facility]  Social Connections: Socially Isolated (03/20/2019)   Received from [facility], [facility]  Stress: Stress Concern Present (03/20/2019)   Received from [facility], [facility]  Tobacco Use: High Risk (04/04/2023)   SDOH Interventions:     Readmission Risk Interventions     No data to display

## 2023-04-05 NOTE — Evaluation (Signed)
 Physical Therapy Evaluation Patient Details Name: William Keith MRN: [id number] DOB: 09-07-1940 Today's Date: 04/05/2023  History of Present Illness  Pt is an 83 y.o. male presenting with ground-level fall.  PMH includes fall history, Parkinson's disease, HTN, anxiety, and depression.  Clinical Impression  Pt was pleasant and motivated to participate during the session and put forth good effort throughout. Pt required only minimal assist with bed mobility tasks and close CGA with transfers and gait.  Pt presented with some mild sway upon initial stand but able to self correct.  During gait pt presented with mild sway and drifting most notably during the first 10-15 feet but stability improved notably as ambulation distance progressed.  Pt reported no adverse symptoms during the session with SpO2 and HR WNL throughout on room air.  Pt will benefit from continued PT services upon discharge to safely address deficits listed in patient problem list for decreased caregiver assistance and eventual return to PLOF.            If plan is discharge home, recommend the following: A little help with walking and/or transfers;A little help with bathing/dressing/bathroom;Assistance with cooking/housework;Assist for transportation   Can travel by private vehicle        Equipment Recommendations None recommended by PT (recommended RW with spouse stating she preferred to obtain one on her own)  Recommendations for Other Services       Functional Status Assessment Patient has had a recent decline in their functional status and demonstrates the ability to make significant improvements in function in a reasonable and predictable amount of time.     Precautions / Restrictions Precautions Precautions: Fall Restrictions Weight Bearing Restrictions Per Provider Order: No      Mobility  Bed Mobility Overal bed mobility: Needs Assistance Bed Mobility: Supine to Sit, Sit to Supine     Supine to sit:  Min assist Sit to supine: Min assist   General bed mobility comments: Min A for trunk control    Transfers Overall transfer level: Needs assistance Equipment used: Rolling walker (2 wheels) Transfers: Sit to/from Stand Sit to Stand: Contact guard assist           General transfer comment: Fair to good eccentric and concentric control with min sway upon initial stand that the pt was able to self-correct    Ambulation/Gait Ambulation/Gait assistance: Contact guard assist Gait Distance (Feet): 60 Feet Assistive device: Rolling walker (2 wheels) Gait Pattern/deviations: Step-through pattern, Decreased step length - right, Decreased step length - left, Drifts right/left Gait velocity: decreased     General Gait Details: Pt presented with min sway and drifting initially during gait but required no physical assist to correct with stability improving as the session progressed  Stairs            Wheelchair Mobility     Tilt Bed    Modified Rankin (Stroke Patients Only)       Balance Overall balance assessment: Needs assistance   Sitting balance-Leahy Scale: Normal     Standing balance support: Bilateral upper extremity supported, During functional activity, Reliant on assistive device for balance Standing balance-Leahy Scale: Fair                               Pertinent Vitals/Pain Pain Assessment Pain Assessment: No/denies pain    Home Living Family/patient expects to be discharged to:: Private residence Living Arrangements: Spouse/significant other Available Help at Discharge: Family;Available 24 hours/day  Type of Home: Independent living facility Home Access: Level entry       Home Layout: One level Home Equipment: Shower seat;Grab bars - tub/shower;Rollator (4 wheels) Additional Comments: Frame with arm rests on elevated toilet, has a bed rail    Prior Function Prior Level of Function : Independent/Modified Independent;History of Falls  (last six months)             Mobility Comments: Mod Ind amb facility distances with a rollator, 6 falls in the last 6 months secondary to tripping ADLs Comments: Ind with ADLs     Extremity/Trunk Assessment   Upper Extremity Assessment Upper Extremity Assessment: Overall WFL for tasks assessed    Lower Extremity Assessment Lower Extremity Assessment: Generalized weakness       Communication   Communication Communication: No apparent difficulties    Cognition Arousal: Alert Behavior During Therapy: WFL for tasks assessed/performed   PT - Cognitive impairments: No apparent impairments                         Following commands: Intact       Cueing Cueing Techniques: Verbal cues     General Comments      Exercises     Assessment/Plan    PT Assessment Patient needs continued PT services  PT Problem List Decreased strength;Decreased activity tolerance;Decreased balance;Decreased mobility;Decreased knowledge of use of DME       PT Treatment Interventions DME instruction;Gait training;Functional mobility training;Therapeutic activities;Therapeutic exercise;Balance training;Patient/family education    PT Goals (Current goals can be found in the Care Plan section)  Acute Rehab PT Goals Patient Stated Goal: Improved balance PT Goal Formulation: With patient Time For Goal Achievement: 04/18/23 Potential to Achieve Goals: Fair    Frequency Min 2X/week     Co-evaluation               AM-PAC PT "6 Clicks" Mobility  Outcome Measure Help needed turning from your back to your side while in a flat bed without using bedrails?: A Little Help needed moving from lying on your back to sitting on the side of a flat bed without using bedrails?: A Little Help needed moving to and from a bed to a chair (including a wheelchair)?: A Little Help needed standing up from a chair using your arms (e.g., wheelchair or bedside chair)?: A Little Help needed to  walk in hospital room?: A Little Help needed climbing 3-5 steps with a railing? : A Lot 6 Click Score: 17    End of Session Equipment Utilized During Treatment: Gait belt Activity Tolerance: Patient tolerated treatment well Patient left: in bed;with call bell/phone within reach;Other (comment) (bilateral bed rails up as found in ED bed) Nurse Communication: Mobility status PT Visit Diagnosis: Unsteadiness on feet (R26.81);History of falling (Z91.81);Difficulty in walking, not elsewhere classified (R26.2);Muscle weakness (generalized) (M62.81)    Time: 0454-0981 PT Time Calculation (min) (ACUTE ONLY): 24 min   Charges:   PT Evaluation $PT Eval Moderate Complexity: 1 Mod PT Treatments $Gait Training: 8-22 mins PT General Charges $$ ACUTE PT VISIT: 1 Visit       D. Elly Modena PT, DPT 04/05/23, 12:24 PM

## 2023-04-05 NOTE — ED Notes (Signed)
 Messaged EDP to order home PD meds.

## 2023-04-05 NOTE — ED Notes (Signed)
 PT at bedside now with pt and pt is awake.

## 2023-04-05 NOTE — TOC Transition Note (Signed)
 Transition of Care [facility]) - Discharge Note   Patient Details  Name: William Keith MRN: [id number] Date of Birth: Aug 10, 1940  Transition of Care [facility]) CM/SW Contact:  Elberta Fortis, RN Phone Number: 04/05/2023, 9:41 AM   Clinical Narrative:    Have not met with pt and his spouse yet. Received a call from [employer] from [place] [facility] [place]. They are able to accommodate him once discharged at the SNF side. They will want everything by 1330 in order to take him today. Patient isn't discharged yet. TOC to continue to follow.          Patient Goals and CMS Choice            Discharge Placement                       Discharge Plan and Services Additional resources added to the After Visit Summary for                                       Social Drivers of Health (SDOH) Interventions SDOH Screenings   Food Insecurity: No Food Insecurity (08/01/2022)  Housing: Unknown (02/17/2023)   Received from [facility]  Transportation Needs: No Transportation Needs (08/01/2022)  Utilities: Not At Risk (08/01/2022)  Financial Resource Strain: Low Risk  (03/20/2019)   Received from [facility], [facility]  Physical Activity: Inactive (03/20/2019)   Received from [facility], [facility]  Social Connections: Socially Isolated (03/20/2019)   Received from [facility], [facility]  Stress: Stress Concern Present (03/20/2019)   Received from [facility], [facility]  Tobacco Use: High Risk (04/04/2023)     Readmission Risk Interventions     No data to display

## 2023-04-05 NOTE — TOC Initial Note (Signed)
 Transition of Care [facility]) - Initial/Assessment Note    Patient Details  Name: William Keith MRN: [id number] Date of Birth: 1940/04/07  Transition of Care [facility]) CM/SW Contact:    Elberta Fortis, RN Phone Number: 04/05/2023, 11:55 AM  Clinical Narrative:                 Met with patient and his spouse Tamela Oddi. He lives in independent living at [facility] at [place] with spouse. Patient uses a rolator, no other DME. He should have a shower chair but has refused to use one. Initially PT was recommending SNF as he was 2 person assist to stand last night. Today he's improved and requiring 1 person stand by assist. ED RN reports he walked with PT, then had pursed lip breathing and hypoxia after walking and was put on O2 2L. Pt normally doesn't use O2. Spoke with Cala Bradford from [facility] [place] and they are able to provide [facility] PT. Will send the necessary paperwork once doctor has entered DC order.         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   Myofascial pain 06/10/2020   Diet-controlled type 2 diabetes mellitus (HCC) 06/05/2020   Open wound of scalp 06/05/2020   Traumatic skin ulcer with fat layer exposed (HCC) 06/05/2020   Benign essential tremor 05/28/2020   Bleeding from scalp laceration 05/10/2020   Blood loss anemia 05/10/2020   Fall 05/10/2020   Atrial  fibrillation (HCC)    Diabetes mellitus without complication (HCC)    Hypercholesteremia    Hypertension    Stroke (HCC)    Neck pain    Dizziness    Depression         Tobacco abuse    Leukocytosis 12/14/2019   Fever 12/14/2019   Headache 12/14/2019   Generalized weakness 12/14/2019   Class 2 severe obesity due to excess calories with serious comorbidity and body mass index (BMI) of 35.0 to 35.9 in adult (HCC) 09/27/2019   Type II diabetes mellitus with complication (HCC) 11/10/2018   Primary osteoarthritis of right knee 10/03/2018   Sepsis (HCC) 09/27/2018   Moderate episode of recurrent major depressive disorder (HCC) 09/15/2018   Convergence insufficiency 09/01/2018   Postconcussion syndrome 09/01/2018   Saccadic eye movement deficiency 09/01/2018   Autonomic neuropathy 08/02/2018   Orthostatic hypotension 08/02/2018   Aneurysm (HCC) 08/26/2017   S/P ablation of atrial fibrillation 10/23/2016   Long term current use of  anticoagulant therapy 07/26/2016   Degenerative joint disease 07/23/2016   DDD (degenerative disc disease), lumbar 02/06/2016   Lumbosacral radiculopathy 02/06/2016   Tremor of both hands 12/19/2015   At high risk for falls 12/18/2015   Ataxia 12/18/2015   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   At risk for sepsis 04/23/2015   Chest pain with high risk for cardiac etiology 04/23/2015   DOE (dyspnea on exertion) 11/15/2014   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) 06/10/2014   PAF (paroxysmal atrial fibrillation) (HCC) 06/10/2014   Frequent falls 06/06/2014   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   Chronic hip pain 08/16/2013   Sensorimotor neuropathy 08/16/2013   Cerebrovascular disease 05/17/2013   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 [place] [address] Phone: [phone] Fax: [phone]     Social Drivers of Health (SDOH) Social History: SDOH Screenings   Food Insecurity: No Food Insecurity (08/01/2022)  Housing: Unknown (02/17/2023)   Received from [facility]  Transportation Needs: No Transportation Needs (08/01/2022)  Utilities: Not At Risk (08/01/2022)  Financial Resource Strain: Low Risk  (03/20/2019)   Received from [facility], [facility]  Physical Activity: Inactive (03/20/2019)   Received from [facility], [facility]  Social Connections: Socially Isolated (03/20/2019)   Received from [facility], [facility]  Stress: Stress Concern Present (03/20/2019)   Received from [facility], [facility]  Tobacco Use: High Risk (04/04/2023)   SDOH Interventions:     Readmission Risk Interventions     No data to display

## 2023-04-05 NOTE — ED Notes (Signed)
 Wife spoke with PT who evaluated pt. Pt will probably discharge today with wife to bring home.

## 2023-04-05 NOTE — ED Provider Notes (Signed)
 Notified by RN that patient had a safe disposition plan available.  Briefly this is a 83 year old who presented following a ground-level fall without significant noted injuries, but did have ongoing weakness and it was not felt that patient could safely return to independent living.  Today, patient with improved pain, was able to get up with physical therapy. Notified by case management that the patient can be discharged home with PT at his assisted living facility.  His facility will provide this service.  Patient evaluated comfortable this plan.  Strict return precautions provided.  Patient discharged stable condition.   Trinna Post, MD 04/05/23 [phone]

## 2023-04-05 NOTE — ED Notes (Signed)
 Wife at bedside. Wife stating she thinks pt looks much better and could go home with her today. Pt placed back on 2L because still appears SOB after walking with PT and SPO2 was 84% on RA. CSW now at bedside with them.

## 2023-06-01 ENCOUNTER — Emergency Department
Admission: EM | Admit: 2023-06-01 | Discharge: 2023-06-03 | Disposition: A | Discharge status: Home / Self Care | Attending: Emergency Medicine | Admitting: Emergency Medicine

## 2023-06-01 ENCOUNTER — Emergency Department

## 2023-06-01 DIAGNOSIS — Z7901 Long term (current) use of anticoagulants: Secondary | ICD-10-CM | POA: Diagnosis not present

## 2023-06-01 DIAGNOSIS — R29898 Other symptoms and signs involving the musculoskeletal system: Secondary | ICD-10-CM

## 2023-06-01 DIAGNOSIS — F0670 Mild neurocognitive disorder due to known physiological condition without behavioral disturbance: Secondary | ICD-10-CM | POA: Diagnosis not present

## 2023-06-01 DIAGNOSIS — G20A1 Parkinson's disease without dyskinesia, without mention of fluctuations: Secondary | ICD-10-CM | POA: Insufficient documentation

## 2023-06-01 DIAGNOSIS — I10 Essential (primary) hypertension: Secondary | ICD-10-CM | POA: Insufficient documentation

## 2023-06-01 DIAGNOSIS — I712 Thoracic aortic aneurysm, without rupture, unspecified: Secondary | ICD-10-CM

## 2023-06-01 DIAGNOSIS — R531 Weakness: Secondary | ICD-10-CM | POA: Insufficient documentation

## 2023-06-01 DIAGNOSIS — G20C Parkinsonism, unspecified: Secondary | ICD-10-CM | POA: Insufficient documentation

## 2023-06-01 LAB — COMPREHENSIVE METABOLIC PANEL WITH GFR
ALT: 6 U/L (ref 0–44)
AST: 17 U/L (ref 15–41)
Albumin: 3.8 g/dL (ref 3.5–5.0)
Alkaline Phosphatase: 70 U/L (ref 38–126)
Anion gap: 8 (ref 5–15)
BUN: 11 mg/dL (ref 8–23)
CO2: 28 mmol/L (ref 22–32)
Calcium: 9.5 mg/dL (ref 8.9–10.3)
Chloride: 105 mmol/L (ref 98–111)
Creatinine, Ser: 0.77 mg/dL (ref 0.61–1.24)
GFR, Estimated: >60 mL/min (ref >60)
Glucose, Bld: 90 mg/dL (ref 70–99)
Potassium: 3.3 mmol/L — ABNORMAL LOW (ref 3.5–5.1)
Sodium: 141 mmol/L (ref 135–145)
Total Bilirubin: 0.8 mg/dL (ref 0.0–1.2)
Total Protein: 6.3 g/dL — ABNORMAL LOW (ref 6.5–8.1)

## 2023-06-01 LAB — RESP PANEL BY RT-PCR (RSV, FLU A&B, COVID)  RVPGX2
Influenza A by PCR: NEGATIVE
Influenza B by PCR: NEGATIVE
Resp Syncytial Virus by PCR: NEGATIVE
SARS Coronavirus 2 by RT PCR: NEGATIVE

## 2023-06-01 LAB — URINALYSIS, ROUTINE W REFLEX MICROSCOPIC
Bilirubin Urine: NEGATIVE
Glucose, UA: NEGATIVE mg/dL
Hgb urine dipstick: NEGATIVE
Ketones, ur: 5 mg/dL — AB
Leukocytes,Ua: NEGATIVE
Nitrite: NEGATIVE
Protein, ur: NEGATIVE mg/dL
Specific Gravity, Urine: 1.023 (ref 1.005–1.030)
pH: 7 (ref 5.0–8.0)

## 2023-06-01 LAB — CBC
HCT: 44.3 % (ref 39.0–52.0)
Hemoglobin: 14.2 g/dL (ref 13.0–17.0)
MCH: 29 pg (ref 26.0–34.0)
MCHC: 32.1 g/dL (ref 30.0–36.0)
MCV: 90.6 fL (ref 80.0–100.0)
Platelets: 177 10*3/uL (ref 150–400)
RBC: 4.89 MIL/uL (ref 4.22–5.81)
RDW: 13.8 % (ref 11.5–15.5)
WBC: 9.2 10*3/uL (ref 4.0–10.5)
nRBC: 0 % (ref 0.0–0.2)

## 2023-06-01 MED ORDER — IOHEXOL 300 MG/ML  SOLN
100.0000 mL | Freq: Once | INTRAMUSCULAR | Status: AC | PRN
  Administered 2023-06-01: 75 mL via INTRAVENOUS

## 2023-06-01 NOTE — ED Provider Notes (Signed)
 [facility] Provider Note    Event Date/Time   First MD Initiated Contact with Patient 06/01/23 Larita Pluck     (approximate)   History   Weakness   HPI William Keith is a 83 y.o. male with history of Parkinson's, HTN, A-fib on Eliquis  presenting today for weakness.  He presents with his wife where they state over the past 4 days he has had progressing bilateral lower extremity weakness.  When he attempts to stand he is shaking and having tremors.  Denies any trauma to his head or back.  No falls from it.  Traditionally can ambulate with a rollator but now unable to stand and pivot.  Denies any numbness or tingling anywhere.  No vision changes.  No speech changes.  Otherwise denies cough, congestion, shortness of breath, chest pain, abdominal pain, nausea, vomiting, dysuria.     Physical Exam   Triage Vital Signs: ED Triage Vitals  Encounter Vitals Group     BP 06/01/23 1923 (!) 139/59     Systolic BP Percentile --      Diastolic BP Percentile --      Pulse Rate 06/01/23 1923 68     Resp 06/01/23 1923 16     Temp 06/01/23 1923 98.4 F (36.9 C)     Temp Source 06/01/23 1923 Oral     SpO2 06/01/23 1922 99 %     Weight 06/01/23 1924 200 lb (90.7 kg)     Height 06/01/23 1924 5\' 8"  (1.727 m)     Head Circumference --      Peak Flow --      Pain Score 06/01/23 1924 0     Pain Loc --      Pain Education --      Exclude from Growth Chart --     Most recent vital signs: Vitals:   06/01/23 1923 06/01/23 2200  BP: (!) 139/59 (!) 158/75  Pulse: 68 69  Resp: 16 17  Temp: 98.4 F (36.9 C)   SpO2: 100% 100%   Physical Exam: I have reviewed the vital signs and nursing notes. General: Awake, alert, no acute distress.  Nontoxic appearing. Head:  Atraumatic, normocephalic.   ENT:  EOM intact, PERRL. Oral mucosa is pink and moist with no lesions. Neck: Neck is supple with full range of motion, No meningeal signs. Cardiovascular:  RRR, No murmurs. Peripheral  pulses palpable and equal bilaterally. Respiratory:  Symmetrical chest wall expansion.  No rhonchi, rales, or wheezes.  Good air movement throughout.  No use of accessory muscles.   Musculoskeletal:  No cyanosis or edema. Moving extremities with full ROM Abdomen:  Soft, nontender, nondistended. Neuro:  GCS 15, moving all four extremities, interacting appropriately. Speech clear.  Cranial nerves II through XII intact.  Sensation equal and intact throughout bilateral upper and lower extremities.  5 out of 5 strength present throughout upper extremities.  4/5 strength in bilateral lower extremities. Psych:  Calm, appropriate.   Skin:  Warm, dry, no rash.    ED Results / Procedures / Treatments   Labs (all labs ordered are listed, but only abnormal results are displayed) Labs Reviewed  COMPREHENSIVE METABOLIC PANEL WITH GFR - Abnormal; Notable for the following components:      Result Value   Potassium 3.3 (*)    Total Protein 6.3 (*)    All other components within normal limits  URINALYSIS, ROUTINE W REFLEX MICROSCOPIC - Abnormal; Notable for the following components:   Color, Urine YELLOW (*)  APPearance CLOUDY (*)    Ketones, ur 5 (*)    All other components within normal limits  RESP PANEL BY RT-PCR (RSV, FLU A&B, COVID)  RVPGX2  CBC     EKG My EKG interpretation: Rate of 68, right bundle branch block.  No acute ST elevations or depressions   RADIOLOGY Independently interpreted CT head, CT chest, and chest x-ray with no acute abnormalities   PROCEDURES:  Critical Care performed: No  Procedures   MEDICATIONS ORDERED IN ED: Medications  iohexol  (OMNIPAQUE ) 300 MG/ML solution 100 mL (75 mLs Intravenous Contrast Given 06/01/23 2024)     IMPRESSION / MDM / ASSESSMENT AND PLAN / ED COURSE  I reviewed the triage vital signs and the nursing notes.                              Differential diagnosis includes, but is not limited to, lumbar spine lesion, worsening  Parkinson's, acute infection, electrolyte abnormality, generalized deterioration  Patient's presentation is most consistent with acute presentation with potential threat to life or bodily function.  Patient is an 83 year old male presenting today for progressive onset weakness over the past 4 days and now unable to ambulate or stand.  Vital signs are unremarkable.  Physical exam with generalized weakness in bilateral lower extremities but not present elsewhere.  No other numbness or tingling.  No other acute neurological symptoms.  No low back pain or saddle anesthesia.  No loss of bowel or bladder.  Will initially get workup around CT head, CBC, BMP, UA, respiratory panel, and chest x-ray to rule out infections or acute electrolyte abnormalities or CVA.  CT head unremarkable.  Chest x-ray and subsequent CT chest shows no evidence of pneumonia or other acute pathology.  Laboratory workup with CBC, CMP, respiratory panel, and UA largely unremarkable aside from slight hypokalemia.  Still no obvious indication of his acute onset lower extremity bilateral weakness.  Given that is just in the legs and on the arms, will get MRI for further evaluation.  This may also be continued manifestation of his Parkinson's as it sounds like his legs tremor when he stands on them.  Patient will be signed out pending results of MRI and may likely need admission/PT/OT consult for further assistance as he is currently in independent living and wife cannot take care of him.  The patient is on the cardiac monitor to evaluate for evidence of arrhythmia and/or significant heart rate changes.     FINAL CLINICAL IMPRESSION(S) / ED DIAGNOSES   Final diagnoses:  Weakness of both lower extremities  Parkinson's disease, unspecified whether dyskinesia present, unspecified whether manifestations fluctuate (HCC)     Rx / DC Orders   ED Discharge Orders     None        Note:  This document was prepared using Dragon voice  recognition software and may include unintentional dictation errors.   Kandee Orion, MD 06/01/23 [phone]

## 2023-06-01 NOTE — ED Triage Notes (Signed)
 Pt BIB [facility] for weakness progressively getting worse for 4 days. Pt unable to stand and increasing tremors. Alert and oriented.

## 2023-06-02 ENCOUNTER — Emergency Department

## 2023-06-02 DIAGNOSIS — R531 Weakness: Secondary | ICD-10-CM | POA: Diagnosis not present

## 2023-06-02 MED ORDER — ACETAMINOPHEN 325 MG PO TABS
650.0000 mg | ORAL_TABLET | Freq: Four times a day (QID) | ORAL | Status: DC | PRN
  Administered 2023-06-02: 650 mg via ORAL
  Filled 2023-06-02: qty 2

## 2023-06-02 MED ORDER — CARBIDOPA-LEVODOPA 25-100 MG PO TABS
2.0000 | ORAL_TABLET | Freq: Three times a day (TID) | ORAL | Status: DC
  Administered 2023-06-02 – 2023-06-03 (×2): 2 via ORAL
  Filled 2023-06-02 (×5): qty 2

## 2023-06-02 NOTE — ED Notes (Signed)
 Pharm tech, Shelagh Derrick doing med rec at this time.   Pt requests tylenol  for HA, MD aware.

## 2023-06-02 NOTE — TOC Initial Note (Signed)
 Transition of Care [facility]) - Initial/Assessment Note    Patient Details  Name: William Keith MRN: [id number] Date of Birth: 04-Dec-1940  Transition of Care [facility]) CM/SW Contact:    William L Deannah Rossi, LCSW Phone Number: 06/02/2023, 9:45 AM  Clinical Narrative:                  CSW met with pt in the ED. Spouse was at bedside. Couple lived at [facility]. Spouse advised that the facility has a SNF and there is a bed ready for the pt but a FL2 is needed. Per RN, PT and OT has not been ordered yet.   William Keith Page [phone] is the POC for [facility].        Patient Goals and CMS Choice Patient states their goals for this hospitalization and ongoing recovery are:: To discharge to bed at [facility] for SNF (Pt and spouse reside at [facility] [place] in their IDL facility.)          Expected Discharge Plan and Services In-house Referral: Clinical Social Work                                            Prior Living Arrangements/Services   Lives with:: Spouse Patient language and need for interpreter reviewed:: Yes Do you feel safe going back to the place where you live?: Yes      Need for Family Participation in Patient Care: Yes (Comment) Care giver support system in place?: Yes (comment)   Criminal Activity/Legal Involvement Pertinent to Current Situation/Hospitalization: No - Comment as needed  Activities of Daily Living      Permission Sought/Granted Permission sought to share information with : Family Supports, Magazine features editor    Share Information with NAME: William Keith  Permission granted to share info w AGENCY: [employer] granted to share info w Relationship: Spouse     Emotional Assessment Appearance:: Appears stated age Attitude/Demeanor/Rapport: Engaged Affect (typically observed): Accepting Orientation: : Oriented to Place, Oriented to Self, Oriented to   Time, Oriented to Situation Alcohol / Substance Use: Never Used Psych Involvement: No (comment)  Admission diagnosis:  weakness Patient Active Problem List   Diagnosis Date Noted   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   Cognitive impairment 08/01/2022   COVID-19 virus infection 08/01/2022   Iron deficiency 07/13/2020   Phimosis 07/12/2020   Normocytic anemia 06/16/2020   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   Myofascial pain 06/10/2020   Diet-controlled type 2 diabetes mellitus (HCC) 06/05/2020   Open wound of scalp 06/05/2020   Traumatic skin ulcer with fat layer exposed (HCC) 06/05/2020   Benign essential tremor 05/28/2020   Bleeding from scalp laceration 05/10/2020   Blood loss anemia 05/10/2020   Fall 05/10/2020   Atrial fibrillation (HCC)    Diabetes mellitus without complication (HCC)    Hypercholesteremia    Hypertension    Stroke [facility])    Neck pain    Dizziness    Depression         Tobacco abuse    Leukocytosis 12/14/2019   Fever 12/14/2019   Headache 12/14/2019   Generalized weakness 12/14/2019   Class 2 severe obesity due to excess calories with serious comorbidity and body mass index (BMI) of 35.0 to 35.9 in adult [facility]) 09/27/2019   Type II diabetes mellitus with complication (HCC)  11/10/2018   Primary osteoarthritis of right knee 10/03/2018   Sepsis (HCC) 09/27/2018   Moderate episode of recurrent major depressive disorder (HCC) 09/15/2018   Convergence insufficiency 09/01/2018   Postconcussion syndrome 09/01/2018   Saccadic eye movement deficiency 09/01/2018   Autonomic neuropathy 08/02/2018   Orthostatic hypotension 08/02/2018   Aneurysm (HCC) 08/26/2017   S/P ablation of atrial fibrillation 10/23/2016   Long term current use of anticoagulant therapy 07/26/2016   Degenerative joint disease 07/23/2016   DDD (degenerative disc disease), lumbar 02/06/2016   Lumbosacral radiculopathy 02/06/2016   Tremor of both hands 12/19/2015   At high risk  for falls 12/18/2015   Ataxia 12/18/2015   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   At risk for sepsis 04/23/2015   Chest pain with high risk for cardiac etiology 04/23/2015   DOE (dyspnea on exertion) 11/15/2014   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) 06/10/2014   PAF (paroxysmal atrial fibrillation) (HCC) 06/10/2014   Frequent falls 06/06/2014   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   Chronic hip pain 08/16/2013   Sensorimotor neuropathy 08/16/2013   Cerebrovascular disease 05/17/2013   Paraparesis of both lower limbs (HCC) 05/08/2013   Cataract 10/26/2012   Thyroid nodule 07/27/2012   Chronic pain syndrome 05/03/2012   Benign renal tumor 04/28/2012   Abdominal pain 04/20/2012   GERD (gastroesophageal reflux disease) 12/23/2011   Atrial fibrillation with RVR (HCC) 08/14/2011   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 [place] [address] Phone: [phone] Fax: [phone]     Social Drivers of Health (SDOH) Social History: SDOH Screenings   Food Insecurity: No Food Insecurity (04/28/2023)   Received [facility]  Housing: Unknown (04/28/2023)   Received from [facility]  Transportation Needs: No Transportation Needs (04/28/2023)   [facility]  Utilities: Not At Risk (04/28/2023)   Received from [facility]  Financial Resource Strain: Low Risk  (04/28/2023)   Received from [facility]  Physical Activity: Inactive (03/20/2019)   Received from [facility], [facility]  Social Connections: Socially Isolated (03/20/2019)   Received from [facility], [facility]  Stress: Stress Concern Present (03/20/2019)   Received from [facility], [facility]  Tobacco Use: Medium Risk (04/28/2023)   Received from [facility]   SDOH Interventions:     Readmission Risk Interventions     No data to display

## 2023-06-02 NOTE — ED Provider Notes (Signed)
 Signout received, pending MRI for disposition TOC versus admission.  No acute emergent findings on MRI.  Patient unable to ambulate due to lower extremity weakness to be evaluated TOC for placement.   Buell Carmin, MD 06/02/23 [phone]

## 2023-06-02 NOTE — ED Notes (Signed)
 Wife at bedside, and requesting update on boarder status, process explained and during conversation social worker, [place] came to speak to pt.

## 2023-06-02 NOTE — NC FL2 (Addendum)
 [place]  MEDICAID FL2 LEVEL OF CARE FORM     IDENTIFICATION  Patient Name: William Keith Birthdate: 10-May-1940 Sex: male Admission Date (Current Location): 06/01/2023  [place] and [place] Number:  Chiropodist and Address:  [facility], [address], [place], [place] [address]      Provider Number: [id number]  Attending Physician Name and Address:  No att. 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   Cognitive impairment 08/01/2022   COVID-19 virus infection 08/01/2022   Iron deficiency 07/13/2020   Phimosis 07/12/2020   Normocytic anemia 06/16/2020   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   Myofascial pain 06/10/2020   Diet-controlled type 2 diabetes mellitus (HCC) 06/05/2020   Open wound of scalp 06/05/2020   Traumatic skin ulcer with fat layer exposed (HCC) 06/05/2020   Benign essential tremor 05/28/2020   Bleeding from scalp laceration 05/10/2020   Blood loss anemia 05/10/2020   Fall 05/10/2020   Atrial fibrillation (HCC)    Diabetes mellitus without complication (HCC)    Hypercholesteremia    Hypertension    Stroke (HCC)    Neck pain    Dizziness    Depression         Tobacco abuse    Leukocytosis 12/14/2019   Fever 12/14/2019   Headache 12/14/2019   Generalized weakness 12/14/2019   Class 2 severe obesity due to excess calories with serious comorbidity and body mass index (BMI) of 35.0 to 35.9 in adult (HCC) 09/27/2019   Type II diabetes mellitus with complication (HCC) 11/10/2018   Primary osteoarthritis of right knee 10/03/2018   Sepsis (HCC) 09/27/2018   Moderate episode  of recurrent major depressive disorder (HCC) 09/15/2018   Convergence insufficiency 09/01/2018   Postconcussion syndrome 09/01/2018   Saccadic eye movement deficiency 09/01/2018   Autonomic neuropathy 08/02/2018   Orthostatic hypotension 08/02/2018   Aneurysm (HCC) 08/26/2017   S/P ablation of atrial fibrillation 10/23/2016   Long term current use of anticoagulant therapy 07/26/2016   Degenerative joint disease 07/23/2016   DDD (degenerative disc disease), lumbar 02/06/2016   Lumbosacral radiculopathy 02/06/2016   Tremor of both hands 12/19/2015   At high risk for falls 12/18/2015   Ataxia 12/18/2015   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   At risk for sepsis 04/23/2015   Chest pain with high risk for cardiac etiology 04/23/2015   DOE (dyspnea on exertion) 11/15/2014   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) 06/10/2014   PAF (paroxysmal atrial fibrillation) (HCC) 06/10/2014   Frequent falls 06/06/2014   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   Chronic hip pain 08/16/2013   Sensorimotor neuropathy 08/16/2013   Cerebrovascular disease 05/17/2013   Paraparesis of both lower limbs (HCC) 05/08/2013   Cataract 10/26/2012   Thyroid nodule 07/27/2012   Chronic pain syndrome 05/03/2012   Benign renal tumor 04/28/2012   Abdominal pain 04/20/2012   GERD (gastroesophageal reflux disease) 12/23/2011   Atrial fibrillation with RVR (HCC) 08/14/2011   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/06/2011   Obesity (BMI 30-39.9) 08/05/2011   Obstructive sleep apnea 08/05/2011   Essential hypertension 08/05/2011    Orientation RESPIRATION BLADDER Height & Weight  Self, Time, Situation, Place  Normal Continent Weight: 200 lb (90.7 kg) Height:  5\' 8"  (172.7 cm)  BEHAVIORAL SYMPTOMS/MOOD NEUROLOGICAL BOWEL NUTRITION STATUS      Continent Diet  AMBULATORY STATUS COMMUNICATION OF NEEDS Skin   Limited Assist Verbally Normal                        Personal Care Assistance Level of Assistance  Bathing, Feeding, Dressing Bathing Assistance: Limited assistance Feeding assistance: Independent Dressing Assistance: Limited assistance     Functional Limitations Info             SPECIAL CARE FACTORS FREQUENCY  PT (By licensed PT)     PT Frequency: 5x              Contractures Contractures Info: Not present    Additional Factors Info  Code Status Code Status Info: DNR             Current Medications (06/02/2023):  This is the current hospital active medication list No current facility-administered medications for this encounter.   Current Outpatient Medications  Medication Sig Dispense Refill   amitriptyline  (ELAVIL ) 25 MG tablet Take 25 mg by mouth at bedtime.     apixaban  (ELIQUIS ) 5 MG TABS tablet Take 5 mg by mouth 2 (two) times daily.     ARIPiprazole  (ABILIFY ) 5 MG tablet Take 2.5 tablets by mouth daily.     Ascorbic Acid (VITAMIN C ) 500 MG CHEW 1 daily (Patient taking differently: Chew 1 tablet by mouth daily. 1 daily) 60 tablet 0   atenolol  (TENORMIN ) 25 MG tablet Take 12.5 mg by mouth daily.     buPROPion  (WELLBUTRIN  XL) 150 MG 24 hr tablet Take 150 mg by mouth daily.     carbidopa -levodopa  (SINEMET  IR) 25-100 MG tablet Take 2 tablets by mouth 3 (three) times daily.     Cholecalciferol  (D2000 ULTRA STRENGTH) 50 MCG (2000 UT) CAPS Take 2,000 Units by mouth daily.     Ciclopirox  1 % shampoo Apply 1 Application topically daily as needed.     clobetasol (TEMOVATE) 0.05 % external solution Apply 1 Application topically 2 (two) times daily.     DULoxetine  (CYMBALTA ) 60 MG capsule Take 1 capsule by mouth 2 (two) times daily.     ezetimibe  (ZETIA ) 10 MG tablet Take 10 mg by mouth daily.     fluocinonide (LIDEX) 0.05 % external solution Apply 1 Application topically 2 (two) times daily.     gabapentin  (NEURONTIN ) 300 MG capsule Take 300 mg by mouth 3 (three) times daily.     metoprolol  succinate  (TOPROL -XL) 25 MG 24 hr tablet Take 12.5 mg by mouth daily.     midodrine (PROAMATINE) 5 MG tablet Take 1 tablet by mouth 2 (two) times daily.     Multiple Vitamins-Minerals (MULTIVITAMIN WITH MINERALS) tablet Take 1 tablet by mouth at bedtime.     nitroGLYCERIN  (NITROSTAT ) 0.4 MG SL tablet Place 0.4 mg under the tongue every 5 (five) minutes as needed for chest pain.     omeprazole (PRILOSEC) 40 MG capsule Take 40 mg by mouth daily.     oxybutynin (DITROPAN-XL) 10 MG 24 hr tablet Take 10 mg by mouth at bedtime.     oxyCODONE -acetaminophen  (PERCOCET/ROXICET) 5-325 MG tablet Take 1 tablet by mouth every 6 (six) hours as needed for moderate pain.     rosuvastatin  (CRESTOR ) 40 MG tablet Take 40 mg by mouth daily.     sertraline  (ZOLOFT ) 50 MG  tablet Take 50 mg by mouth daily.     tamsulosin  (FLOMAX ) 0.4 MG CAPS capsule Take 1 capsule (0.4 mg total) by mouth at bedtime. 30 capsule 0   Zinc  220 (50 Zn) MG CAPS Take 1 tablet daily 30 capsule 1     Discharge Medications: Please see discharge summary for a list of discharge medications.  Relevant Imaging Results:  Relevant Lab Results:   Additional Information [id number]  [place] L Brackenridge Cohick, LCSW

## 2023-06-02 NOTE — TOC Progression Note (Signed)
 Transition of Care [facility]) - Progression Note    Patient Details  Name: William Keith MRN: [id number] Date of Birth: 07-21-1940  Transition of Care [facility]) CM/SW Contact  [place] L Trygg Mantz, [place] Phone Number: 06/02/2023, 9:53 AM  Clinical Narrative:     CSW received a call from Burman Carrie, [place], regarding pt. Ms. Glyn Laser advised that the family is willing to pay private until insurance authorizes. CSW advised that an FL2 can't be completed until OT/PT evaluates pt.        Expected Discharge Plan and Services In-house Referral: Clinical Social Work                                             Social Determinants of Health (SDOH) Interventions SDOH Screenings   Food Insecurity: No Food Insecurity (04/28/2023)   Received [facility]  Housing: Unknown (04/28/2023)   Received from [facility]  Transportation Needs: No Transportation Needs (04/28/2023)   [facility] from [facility]  Utilities: Not At Risk (04/28/2023)   [facility]  Financial Resource Strain: Low Risk  (04/28/2023)   Received from [facility]  Physical Activity: Inactive (03/20/2019)   Received from [facility], [facility]  Social Connections: Socially Isolated (03/20/2019)   Received [facility], [facility]  Stress: Stress Concern Present (03/20/2019)   Received [facility], [facility]  Tobacco Use: Medium Risk (04/28/2023)   Received from [facility]    Readmission Risk Interventions     No data to display

## 2023-06-02 NOTE — Evaluation (Signed)
 Physical Therapy Evaluation Patient Details Name: William Keith MRN: [id number] DOB: 1940-06-06 Today's Date: 06/02/2023  History of Present Illness  Pt is an 83 y.o. male presenting to hospital 06/01/23 with c/o weakness B LE's; having shaking and tremors when attempting to stand; unable to ambulate.  Clinical Impression  Prior to recent medical concerns, pt reports being modified independent ambulating with rollator; lives with his [facility] at [facility].  Currently pt is min assist with bed mobility; mod assist to stand from bed (x2 trials) with RW use; and min assist to side step to R along bed with RW use a few feet.  Pt requiring assist for balance in standing d/t pt very tremorous and appearing weak in general.  Pt would currently benefit from skilled PT to address noted impairments and functional limitations (see below for any additional details).  Upon hospital discharge, pt would benefit from ongoing therapy.  Pt appearing very motivated to participate in therapy and improve functional status.  Pt's nurse and TOC updated on pt's status and therapy recommendations.    If plan is discharge home, recommend the following: A lot of help with walking and/or transfers;A lot of help with bathing/dressing/bathroom;Assistance with cooking/housework;Assist for transportation;Help with stairs or ramp for entrance   Can travel by private vehicle    No    Equipment Recommendations Other (comment) (TBD at next facility)  Recommendations for Other Services       Functional Status Assessment Patient has had a recent decline in their functional status and demonstrates the ability to make significant improvements in function in a reasonable and predictable amount of time.     Precautions / Restrictions Precautions Precautions: Fall Recall of Precautions/Restrictions: Intact Restrictions Weight Bearing Restrictions Per Provider Order: No      Mobility  Bed  Mobility Overal bed mobility: Needs Assistance Bed Mobility: Supine to Sit, Sit to Supine     Supine to sit: Min assist Sit to supine: Min assist   General bed mobility comments: assist for trunk    Transfers Overall transfer level: Needs assistance Equipment used: Rolling walker (2 wheels) Transfers: Sit to/from Stand Sit to Stand: Mod assist           General transfer comment: assist to stand and steady d/t pt tremorous in general    Ambulation/Gait Ambulation/Gait assistance: Min assist Gait Distance (Feet):  (side step to R along bed a few feet) Assistive device: Rolling walker (2 wheels)   Gait velocity: decreased     General Gait Details: pt tremorous in general; assist to steady; increased effort/time to take steps  Stairs            Wheelchair Mobility     Tilt Bed    Modified Rankin (Stroke Patients Only)       Balance Overall balance assessment: Needs assistance Sitting-balance support: No upper extremity supported, Feet supported Sitting balance-Leahy Scale: Good Sitting balance - Comments: steady reaching within BOS   Standing balance support: Bilateral upper extremity supported, Reliant on assistive device for balance Standing balance-Leahy Scale: Poor Standing balance comment: assist to steady in standing (pt tremorous)                             Pertinent Vitals/Pain Pain Assessment Pain Assessment: Faces Faces Pain Scale:  (0/10 at rest; 6/10 with touch to posterior neck paraspinals) Pain Location: posterior neck paraspinals Pain Descriptors / Indicators: Grimacing, Tender Pain Intervention(s): Limited  activity within patient's tolerance, Monitored during session, Repositioned VSS (HR and SpO2 on room air) during session.    Home Living Family/patient expects to be discharged to:: Private residence Living Arrangements: Spouse/significant other Available Help at Discharge: Family;Available 24 hours/day Type of  Home: Independent living facility Home Access: Level entry       Home Layout: One level Home Equipment: Shower seat;Grab bars - tub/shower;Rollator (4 wheels);Grab bars - toilet      Prior Function Prior Level of Function : Independent/Modified Independent;History of Falls (last six months)             Mobility Comments: Modified independent ambulating with rollator; 3-4 falls in last month (d/t legs giving out mostly but also tripping)       Extremity/Trunk Assessment   Upper Extremity Assessment Upper Extremity Assessment: Overall WFL for tasks assessed    Lower Extremity Assessment Lower Extremity Assessment: Generalized weakness (B LE's tremorous with activity (no tremors at rest))    Cervical / Trunk Assessment Cervical / Trunk Assessment: Other exceptions;Neck Surgery Cervical / Trunk Exceptions: forward neck/head  Communication   Communication Communication: No apparent difficulties    Cognition Arousal: Alert Behavior During Therapy: WFL for tasks assessed/performed   PT - Cognitive impairments: No apparent impairments                         Following commands: Intact       Cueing Cueing Techniques: Verbal cues     General Comments  Pt's wife present during session.    Exercises     Assessment/Plan    PT Assessment Patient needs continued PT services  PT Problem List Decreased strength;Decreased activity tolerance;Decreased balance;Decreased mobility;Decreased coordination       PT Treatment Interventions DME instruction;Gait training;Functional mobility training;Therapeutic activities;Therapeutic exercise;Balance training;Patient/family education    PT Goals (Current goals can be found in the Care Plan section)  Acute Rehab PT Goals Patient Stated Goal: to improve transfers and walking PT Goal Formulation: With patient/family Time For Goal Achievement: 06/16/23 Potential to Achieve Goals: Good    Frequency Min 2X/week      Co-evaluation               AM-PAC PT "6 Clicks" Mobility  Outcome Measure Help needed turning from your back to your side while in a flat bed without using bedrails?: A Little Help needed moving from lying on your back to sitting on the side of a flat bed without using bedrails?: A Little Help needed moving to and from a bed to a chair (including a wheelchair)?: A Lot Help needed standing up from a chair using your arms (e.g., wheelchair or bedside chair)?: A Lot Help needed to walk in hospital room?: Total Help needed climbing 3-5 steps with a railing? : Total 6 Click Score: 12    End of Session Equipment Utilized During Treatment: Gait belt Activity Tolerance: Patient tolerated treatment well Patient left: in bed;with call bell/phone within reach;with bed alarm set;with family/visitor present Nurse Communication: Mobility status;Precautions;Other (comment) (Pt requesting Parkinson medication and to talk with nurse d/t having questions) PT Visit Diagnosis: Unsteadiness on feet (R26.81);Other abnormalities of gait and mobility (R26.89);Repeated falls (R29.6);Muscle weakness (generalized) (M62.81);History of falling (Z91.81)    Time: 4403-4742 PT Time Calculation (min) (ACUTE ONLY): 22 min   Charges:   PT Evaluation $PT Eval Low Complexity: 1 Low PT Treatments $Therapeutic Activity: 8-22 mins PT General Charges $$ ACUTE PT VISIT: 1 Visit  Amador Junes, PT 06/02/23, 3:37 PM

## 2023-06-02 NOTE — ED Notes (Signed)
 Patient assisted with urinal at this time and provided with a sprite at bedside. Fall bundle in place. Non skid socks in place, fall bracelet on and bed alarm on at this time.

## 2023-06-03 DIAGNOSIS — R531 Weakness: Secondary | ICD-10-CM | POA: Diagnosis not present

## 2023-06-03 MED ORDER — APIXABAN 5 MG PO TABS
5.0000 mg | ORAL_TABLET | Freq: Two times a day (BID) | ORAL | Status: DC
  Administered 2023-06-03 (×2): 5 mg via ORAL
  Filled 2023-06-03 (×2): qty 1

## 2023-06-03 MED ORDER — MIDODRINE HCL 5 MG PO TABS: 5.0000 mg | ORAL_TABLET | Freq: Two times a day (BID) | ORAL | Status: DC

## 2023-06-03 MED ORDER — CLOBETASOL PROPIONATE 0.05 % EX SOLN: 1.0000 | Freq: Two times a day (BID) | CUTANEOUS | Status: DC

## 2023-06-03 MED ORDER — FENTANYL 25 MCG/HR TD PT72: 1.0000 | MEDICATED_PATCH | TRANSDERMAL | Status: DC

## 2023-06-03 MED ORDER — EZETIMIBE 10 MG PO TABS
10.0000 mg | ORAL_TABLET | Freq: Every day | ORAL | Status: DC
  Administered 2023-06-03: 10 mg via ORAL
  Filled 2023-06-03: qty 1

## 2023-06-03 MED ORDER — DULOXETINE HCL 60 MG PO CPEP
60.0000 mg | ORAL_CAPSULE | Freq: Two times a day (BID) | ORAL | Status: DC
  Administered 2023-06-03 (×2): 60 mg via ORAL
  Filled 2023-06-03 (×2): qty 1

## 2023-06-03 MED ORDER — PANTOPRAZOLE SODIUM 40 MG PO TBEC
40.0000 mg | DELAYED_RELEASE_TABLET | Freq: Every day | ORAL | Status: DC
  Administered 2023-06-03: 40 mg via ORAL
  Filled 2023-06-03: qty 1

## 2023-06-03 MED ORDER — ROSUVASTATIN CALCIUM 20 MG PO TABS
40.0000 mg | ORAL_TABLET | Freq: Every day | ORAL | Status: DC
  Filled 2023-06-03: qty 2

## 2023-06-03 MED ORDER — TAMSULOSIN HCL 0.4 MG PO CAPS
0.4000 mg | ORAL_CAPSULE | Freq: Every day | ORAL | Status: DC
  Administered 2023-06-03: 0.4 mg via ORAL
  Filled 2023-06-03: qty 1

## 2023-06-03 MED ORDER — OXYBUTYNIN CHLORIDE ER 10 MG PO TB24
10.0000 mg | ORAL_TABLET | Freq: Every day | ORAL | Status: DC
  Administered 2023-06-03: 10 mg via ORAL
  Filled 2023-06-03: qty 1

## 2023-06-03 MED ORDER — ADULT MULTIVITAMIN W/MINERALS CH: 1.0000 | ORAL_TABLET | Freq: Every day | ORAL | Status: DC

## 2023-06-03 MED ORDER — BUPROPION HCL ER (XL) 150 MG PO TB24
150.0000 mg | ORAL_TABLET | Freq: Every day | ORAL | Status: DC
  Administered 2023-06-03: 150 mg via ORAL
  Filled 2023-06-03: qty 1

## 2023-06-03 MED ORDER — ATENOLOL 25 MG PO TABS
12.5000 mg | ORAL_TABLET | Freq: Every day | ORAL | Status: DC
  Administered 2023-06-03: 12.5 mg via ORAL
  Filled 2023-06-03: qty 1

## 2023-06-03 MED ORDER — GABAPENTIN 300 MG PO CAPS
300.0000 mg | ORAL_CAPSULE | Freq: Two times a day (BID) | ORAL | Status: DC
  Administered 2023-06-03 (×2): 300 mg via ORAL
  Filled 2023-06-03 (×2): qty 1

## 2023-06-03 MED ORDER — MIDODRINE HCL 5 MG PO TABS
5.0000 mg | ORAL_TABLET | Freq: Two times a day (BID) | ORAL | Status: DC
  Administered 2023-06-03: 5 mg via ORAL
  Filled 2023-06-03: qty 1

## 2023-06-03 MED ORDER — ARIPIPRAZOLE 15 MG PO TABS
12.5000 mg | ORAL_TABLET | Freq: Every day | ORAL | Status: DC
  Administered 2023-06-03: 12.5 mg via ORAL
  Filled 2023-06-03: qty 1

## 2023-06-03 MED ORDER — FLUDROCORTISONE ACETATE 0.1 MG PO TABS
0.1000 mg | ORAL_TABLET | Freq: Two times a day (BID) | ORAL | Status: DC
  Administered 2023-06-03: 0.1 mg via ORAL
  Filled 2023-06-03 (×2): qty 1

## 2023-06-03 MED ORDER — NITROGLYCERIN 0.4 MG SL SUBL: 0.4000 mg | SUBLINGUAL_TABLET | SUBLINGUAL | Status: DC | PRN

## 2023-06-03 MED ORDER — AMITRIPTYLINE HCL 50 MG PO TABS
25.0000 mg | ORAL_TABLET | Freq: Every day | ORAL | Status: DC
  Administered 2023-06-03: 25 mg via ORAL
  Filled 2023-06-03: qty 1

## 2023-06-03 MED ORDER — SERTRALINE HCL 50 MG PO TABS
50.0000 mg | ORAL_TABLET | Freq: Every day | ORAL | Status: DC
  Administered 2023-06-03: 50 mg via ORAL

## 2023-06-03 MED ORDER — OXYCODONE HCL 5 MG PO TABS: 5.0000 mg | ORAL_TABLET | ORAL | Status: DC | PRN

## 2023-06-03 MED ORDER — VITAMIN D3 25 MCG (1000 UNIT) PO TABS
2000.0000 [IU] | ORAL_TABLET | Freq: Every day | ORAL | Status: DC
  Administered 2023-06-03: 2000 [IU] via ORAL
  Filled 2023-06-03 (×2): qty 2

## 2023-06-03 MED ORDER — CARBIDOPA-LEVODOPA 25-100 MG PO TABS: 2.0000 | ORAL_TABLET | Freq: Three times a day (TID) | ORAL | Status: DC

## 2023-06-03 MED ORDER — METOPROLOL SUCCINATE ER 25 MG PO TB24
12.5000 mg | ORAL_TABLET | Freq: Every day | ORAL | Status: DC
  Administered 2023-06-03: 12.5 mg via ORAL
  Filled 2023-06-03: qty 1

## 2023-06-03 NOTE — TOC Progression Note (Addendum)
 Transition of Care [facility]) - Progression Note    Patient Details  Name: William Keith MRN: [id number] Date of Birth: May 30, 1940  Transition of [facility]) CM/SW Contact  Arminda Landmark, RN Phone Number: 06/03/2023, 8:57 AM  Clinical Narrative:    Patient is needing to go to SNF due to weakness and difficulties ambulating. He's had his PT eval and FL2 is signed. Spoke to [place] at [place] at [place] and she's requesting a PASSR also. Faxed FL2 and PT eval, then spoke to [place] and she said that fax has been out of service for 5 years and that she can look it up in Epic. They can accept him today and she will call me back with the room #, etc. 0930: Spoke with pt and confirmed he's ready and wants to go to [facility] at [facility]. He's going to room 332 and the # to call report is [phone] which was given to his nurse. Called [facility]- spoke with [place] and transport is arranged for at least 2-3 hours from 1000. TOC to continue to follow.         Expected Discharge Plan and Services In-house Referral: Clinical Social Work                                             Social Determinants of Health (SDOH) Interventions SDOH Screenings   Food Insecurity: No Food Insecurity (04/28/2023)   Received from [facility]  Housing: Unknown (04/28/2023)   Received from [facility]  Transportation Needs: No Transportation Needs (04/28/2023)   Received from [facility]  Utilities: Not At Risk (04/28/2023)   Received from [facility]  Financial Resource Strain: Low Risk  (04/28/2023)   Received from [facility]  Physical Activity: Inactive (03/20/2019)   Received from [facility], [facility]  Social Connections: Socially Isolated (03/20/2019)   Received from [facility], [facility]  Stress: Stress Concern Present  (03/20/2019)   Received from [facility], [facility]  Tobacco Use: Medium Risk (04/28/2023)   Received from [facility]    Readmission Risk Interventions     No data to display

## 2023-06-03 NOTE — ED Provider Notes (Signed)
-----------------------------------------   10:12 AM on 06/03/2023 -----------------------------------------  TOC has been consulted and made SNF arrangements for the patient.  He is stable for discharge to SNF at this time.   Lind Repine, MD 06/03/23 1012

## 2023-06-03 NOTE — ED Notes (Signed)
 Report called to [place] at [facility] at [place]
